# Patient Record
Sex: Male | Born: 1985 | Race: Black or African American | Hispanic: No | Marital: Single | Smoking: Current every day smoker
Health system: Southern US, Community
[De-identification: ages and names within clinical notes are randomized; demographics above are authoritative.]

## PROBLEM LIST (undated history)

## (undated) ENCOUNTER — Emergency Department (HOSPITAL_COMMUNITY): Admission: EM | Payer: Self-pay | Source: Home / Self Care

## (undated) DIAGNOSIS — M1990 Unspecified osteoarthritis, unspecified site: Secondary | ICD-10-CM

## (undated) DIAGNOSIS — J45909 Unspecified asthma, uncomplicated: Secondary | ICD-10-CM

## (undated) DIAGNOSIS — Z87442 Personal history of urinary calculi: Secondary | ICD-10-CM

## (undated) DIAGNOSIS — Z189 Retained foreign body fragments, unspecified material: Secondary | ICD-10-CM

## (undated) DIAGNOSIS — Z87828 Personal history of other (healed) physical injury and trauma: Secondary | ICD-10-CM

## (undated) DIAGNOSIS — E119 Type 2 diabetes mellitus without complications: Secondary | ICD-10-CM

## (undated) DIAGNOSIS — I10 Essential (primary) hypertension: Secondary | ICD-10-CM

## (undated) DIAGNOSIS — Z9889 Other specified postprocedural states: Secondary | ICD-10-CM

## (undated) DIAGNOSIS — W3400XA Accidental discharge from unspecified firearms or gun, initial encounter: Secondary | ICD-10-CM

## (undated) DIAGNOSIS — N1330 Unspecified hydronephrosis: Secondary | ICD-10-CM

## (undated) DIAGNOSIS — F1910 Other psychoactive substance abuse, uncomplicated: Secondary | ICD-10-CM

## (undated) HISTORY — PX: ABDOMINAL SURGERY: SHX537

---
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
## 2006-03-21 ENCOUNTER — Emergency Department (HOSPITAL_COMMUNITY): Admission: EM | Admit: 2006-03-21 | Discharge: 2006-03-21 | Payer: Self-pay | Admitting: Emergency Medicine

## 2009-01-24 ENCOUNTER — Emergency Department (HOSPITAL_COMMUNITY): Admission: EM | Admit: 2009-01-24 | Discharge: 2009-01-24 | Payer: Self-pay | Admitting: Emergency Medicine

## 2009-01-25 ENCOUNTER — Emergency Department (HOSPITAL_COMMUNITY): Admission: EM | Admit: 2009-01-25 | Discharge: 2009-01-26 | Payer: Self-pay | Admitting: Emergency Medicine

## 2010-07-29 LAB — COMPREHENSIVE METABOLIC PANEL
AST: 32 U/L (ref 0–37)
Albumin: 3.8 g/dL (ref 3.5–5.2)
BUN: 10 mg/dL (ref 6–23)
Calcium: 9.1 mg/dL (ref 8.4–10.5)
Chloride: 103 mEq/L (ref 96–112)
Creatinine, Ser: 0.94 mg/dL (ref 0.4–1.5)
GFR calc Af Amer: >60 mL/min (ref >60)
Total Bilirubin: 1 mg/dL (ref 0.3–1.2)

## 2010-07-29 LAB — POCT I-STAT, CHEM 8
BUN: 9 mg/dL (ref 6–23)
Calcium, Ion: 1.21 mmol/L (ref 1.12–1.32)
Chloride: 103 mEq/L (ref 96–112)
Creatinine, Ser: 0.9 mg/dL (ref 0.4–1.5)
Glucose, Bld: 102 mg/dL — ABNORMAL HIGH (ref 70–99)
TCO2: 27 mmol/L (ref 0–100)

## 2010-07-29 LAB — CBC
HCT: 45.1 % (ref 39.0–52.0)
MCHC: 32.9 g/dL (ref 30.0–36.0)
MCV: 83 fL (ref 78.0–100.0)
Platelets: 216 10*3/uL (ref 150–400)
RDW: 14.5 % (ref 11.5–15.5)
WBC: 7.6 10*3/uL (ref 4.0–10.5)

## 2010-07-29 LAB — LIPASE, BLOOD
Lipase: 15 U/L (ref 11–59)
Lipase: 17 U/L (ref 11–59)

## 2010-07-29 LAB — ETHANOL: Alcohol, Ethyl (B): <5 mg/dL (ref 0–10)

## 2010-07-29 LAB — DIFFERENTIAL
Basophils Absolute: 0 10*3/uL (ref 0.0–0.1)
Lymphocytes Relative: 5 % — ABNORMAL LOW (ref 12–46)
Lymphs Abs: 0.4 10*3/uL — ABNORMAL LOW (ref 0.7–4.0)
Monocytes Absolute: 0.2 10*3/uL (ref 0.1–1.0)
Neutro Abs: 7 10*3/uL (ref 1.7–7.7)

## 2010-09-10 NOTE — Op Note (Signed)
NAMEARTURO, Carlos Lawrence                     ACCOUNT NO.:  [id number]   MEDICAL RECORD NO.:  [id number]                   PATIENT TYPE:  AMB   LOCATION:  DSC                                  FACILITY:  [facility]   PHYSICIAN:  Lowell Bouton, M.D.      DATE OF BIRTH:  23-Dec-1985   DATE OF PROCEDURE:  05/29/2002  DATE OF DISCHARGE:                                 OPERATIVE REPORT   PREOPERATIVE DIAGNOSIS:  Lacerated radial digital artery and nerve, right  index finger.   POSTOPERATIVE DIAGNOSIS:  Lacerated radial digital artery and nerve, right  index finger.   OPERATION PERFORMED:  Repair of radial digital artery and nerve, right index  finger.   SURGEON:  Lowell Bouton, M.D.   ANESTHESIA:  General.   OPERATIVE FINDINGS:  The patient had a transverse laceration over the radial  aspect of the digit that extended through the radial neurovascular bundle at  the level of the proximal phalanx.  The flexor tendons appeared to be  intact.   DESCRIPTION OF PROCEDURE:  Under general anesthesia with a tourniquet on the  right arm, the right hand was prepped and draped in the usual fashion and  the sutures were removed from the previous skin closure.  The wound was  spread open and there was active bleeding.  The laceration was extended  proximally and distally in a zigzag fashion.  Blunt dissection was carried  through the subcutaneous tissues and 4-0 silk retention sutures were  inserted to retract the flaps.  Blunt dissection was carried down to the  flexor sheath and the sheath was not violated by the injury.  Both flexor  tendons were intact.  The microscope was then brought in and the  neurovascular bundle was identified by proximally and distally.  The artery  was repaired first after cutting back the proximal end to good intima.  The  vessel was dilated with Dora Sims solution and a dilator.  An approximating clamp  was applied proximally.  The distal portion of  the vessel was at a branching  and so was not trimmed back but the adventitia was pulled back.  The vessel  was dilated with Dora Sims solution and a dilator and a repair was performed  using a 10-0 nylon interrupted suture.  The vascular clamps were removed.  The digital nerve was then freed from the soft tissues and dissected out  proximally and distally.  The nerve ends were trimmed back with straight  scissors to good fascicles.  An epineurial repair was then performed using a  9-0 nylon suture in an interrupted fashion.  The tourniquet was then  released and there was good flow through the vessel.  The wound was  irrigated with saline.  The skin was closed with 4-0 nylon sutures.  Sterile  dressings were applied followed by a dorsal protective splint.  A Marcaine  digital block was performed for pain control.  The patient tolerated the  procedure well and  went to the recovery room awake and stable in  good  condition.                                                Lowell Bouton, M.D.    EMM/MEDQ  D:  05/29/2002  T:  05/29/2002  Job:  [id number]

## 2012-12-22 ENCOUNTER — Emergency Department (HOSPITAL_COMMUNITY)
Admission: EM | Admit: 2012-12-22 | Discharge: 2012-12-22 | Disposition: A | Discharge status: Home / Self Care | Payer: Self-pay | Attending: Emergency Medicine | Admitting: Emergency Medicine

## 2012-12-22 ENCOUNTER — Encounter (HOSPITAL_COMMUNITY): Payer: Self-pay

## 2012-12-22 DIAGNOSIS — R112 Nausea with vomiting, unspecified: Secondary | ICD-10-CM | POA: Insufficient documentation

## 2012-12-22 DIAGNOSIS — R109 Unspecified abdominal pain: Secondary | ICD-10-CM | POA: Insufficient documentation

## 2012-12-22 DIAGNOSIS — R42 Dizziness and giddiness: Secondary | ICD-10-CM | POA: Insufficient documentation

## 2012-12-22 HISTORY — DX: Unspecified asthma, uncomplicated: J45.909

## 2012-12-22 LAB — COMPREHENSIVE METABOLIC PANEL
ALT: 16 U/L (ref 0–53)
Alkaline Phosphatase: 54 U/L (ref 39–117)
BUN: 7 mg/dL (ref 6–23)
CO2: 29 mEq/L (ref 19–32)
GFR calc Af Amer: >90 mL/min (ref >90)
GFR calc non Af Amer: >90 mL/min (ref >90)
Glucose, Bld: 114 mg/dL — ABNORMAL HIGH (ref 70–99)
Potassium: 4.1 mEq/L (ref 3.5–5.1)
Sodium: 138 mEq/L (ref 135–145)
Total Bilirubin: 0.3 mg/dL (ref 0.3–1.2)

## 2012-12-22 LAB — LIPASE, BLOOD: Lipase: 68 U/L — ABNORMAL HIGH (ref 11–59)

## 2012-12-22 LAB — CBC
HCT: 41.2 % (ref 39.0–52.0)
Hemoglobin: 15 g/dL (ref 13.0–17.0)
RBC: 5.32 MIL/uL (ref 4.22–5.81)

## 2012-12-22 MED ORDER — ONDANSETRON HCL 4 MG/2ML IJ SOLN
4.0000 mg | Freq: Once | INTRAMUSCULAR | Status: AC
  Administered 2012-12-22: 4 mg via INTRAVENOUS
  Filled 2012-12-22: qty 2

## 2012-12-22 MED ORDER — GI COCKTAIL ~~LOC~~
30.0000 mL | Freq: Once | ORAL | Status: AC
  Administered 2012-12-22: 30 mL via ORAL
  Filled 2012-12-22: qty 30

## 2012-12-22 MED ORDER — PROMETHAZINE HCL 25 MG PO TABS: 25.0000 mg | ORAL_TABLET | Freq: Four times a day (QID) | ORAL | Status: DC | PRN

## 2012-12-22 MED ORDER — SODIUM CHLORIDE 0.9 % IV BOLUS (SEPSIS)
1000.0000 mL | Freq: Once | INTRAVENOUS | Status: AC
  Administered 2012-12-22: 1000 mL via INTRAVENOUS

## 2012-12-22 MED ORDER — PROMETHAZINE HCL 25 MG PO TABS
25.0000 mg | ORAL_TABLET | Freq: Once | ORAL | Status: AC
  Administered 2012-12-22: 25 mg via ORAL
  Filled 2012-12-22: qty 1

## 2012-12-22 NOTE — ED Notes (Signed)
Patient having emesis

## 2012-12-22 NOTE — ED Notes (Signed)
Patient tolerating fluids at this time without difficulty.

## 2012-12-22 NOTE — ED Notes (Signed)
Patient c/o abdominal pain which came on all of a sudden. Acknowledges having N/V. Vomited 6X since. Denies diarrhea. Patient took tylenol without relief.

## 2012-12-22 NOTE — ED Provider Notes (Signed)
CSN: [id number]     Arrival date & time 12/22/12  0725 History   First MD Initiated Contact with Patient 12/22/12 [phone]     Chief Complaint  Patient presents with  . Abdominal Pain   (Consider location/radiation/quality/duration/timing/severity/associated sxs/prior Treatment) Patient is a 27 y.o. male presenting with abdominal pain. The history is provided by the patient.  Abdominal Pain Pain location:  Epigastric Pain quality: sharp   Pain radiates to:  Does not radiate Pain severity:  Severe Onset quality:  Sudden Duration:  12 hours Timing:  Constant Chronicity:  New Context: not alcohol use, not previous surgeries, not recent illness, not sick contacts and not suspicious food intake   Relieved by:  Nothing Associated symptoms: nausea and vomiting   Associated symptoms: no constipation, no diarrhea, no dysuria, no fever and no hematuria     No past medical history on file. No past surgical history on file. No family history on file. History  Substance Use Topics  . Smoking status: Not on file  . Smokeless tobacco: Not on file  . Alcohol Use: Not on file    Review of Systems  Constitutional: Negative for fever.  Gastrointestinal: Positive for nausea, vomiting and abdominal pain. Negative for diarrhea, constipation, blood in stool and abdominal distention.  Genitourinary: Negative for dysuria, hematuria, penile pain and testicular pain.  Musculoskeletal: Negative for back pain.  Neurological: Positive for light-headedness (feels lightheaded after vomiting).  All other systems reviewed and are negative.    Allergies  Review of patient's allergies indicates not on file.  Home Medications  No current outpatient prescriptions on file. There were no vitals taken for this visit. Physical Exam  Nursing note and vitals reviewed. Constitutional: He is oriented to person, place, and time. He appears well-developed and well-nourished. No distress.  HENT:  Head:  Normocephalic and atraumatic.  Right Ear: External ear normal.  Left Ear: External ear normal.  Nose: Nose normal.  Eyes: Right eye exhibits no discharge. Left eye exhibits no discharge.  Neck: Neck supple.  Cardiovascular: Normal rate, regular rhythm, normal heart sounds and intact distal pulses.   Pulmonary/Chest: Effort normal and breath sounds normal.  Abdominal: Soft. Normal appearance. There is tenderness (mild) in the epigastric area and left upper quadrant.  Musculoskeletal: He exhibits no edema.  Neurological: He is alert and oriented to person, place, and time.  Skin: Skin is warm and dry.    ED Course  Procedures (including critical care time) Labs Review Labs Reviewed  CBC - Abnormal; Notable for the following:    WBC 11.0 (*)    MCV 77.4 (*)    MCHC 36.4 (*)    All other components within normal limits  COMPREHENSIVE METABOLIC PANEL - Abnormal; Notable for the following:    Glucose, Bld 114 (*)    AST 40 (*)    All other components within normal limits  LIPASE, BLOOD - Abnormal; Notable for the following:    Lipase 68 (*)    All other components within normal limits   Imaging Review No results found.  MDM   1. Abdominal pain   2. Nausea & vomiting     27 year old male with vomiting and epigastric pain since last night. Denies any preceding symptoms. His abdominal exam is benign including no pain in the lower quadrants. No urinary symptoms. He appears well, will give fluids, Zofran, and a GI cocktail. Low concern for a surgical process in his abdomen. Sx improved with anti-emetics, and he was able to  take PO in ED. Most c/w a viral process, will treat symptomatically, discussed using anti-emetics at home, as well as liquid diet.    Audree Camel, MD 12/22/12 [phone]

## 2013-09-13 ENCOUNTER — Encounter (HOSPITAL_COMMUNITY): Payer: Self-pay | Admitting: Certified Registered Nurse Anesthetist

## 2013-09-13 ENCOUNTER — Emergency Department (HOSPITAL_COMMUNITY): Payer: Self-pay

## 2013-09-13 ENCOUNTER — Encounter (HOSPITAL_COMMUNITY): Payer: Self-pay | Admitting: Emergency Medicine

## 2013-09-13 ENCOUNTER — Encounter (HOSPITAL_COMMUNITY): Admission: EM | Disposition: A | Discharge status: Home / Self Care | Payer: Self-pay | Source: Home / Self Care

## 2013-09-13 ENCOUNTER — Inpatient Hospital Stay (HOSPITAL_COMMUNITY)
Admission: EM | Admit: 2013-09-13 | Discharge: 2013-09-18 | DRG: 580 | Disposition: A | Discharge status: Home / Self Care | Payer: Self-pay | Attending: General Surgery | Admitting: General Surgery

## 2013-09-13 ENCOUNTER — Inpatient Hospital Stay (HOSPITAL_COMMUNITY): Payer: Self-pay

## 2013-09-13 ENCOUNTER — Emergency Department (HOSPITAL_COMMUNITY): Payer: Self-pay | Admitting: Certified Registered Nurse Anesthetist

## 2013-09-13 DIAGNOSIS — I10 Essential (primary) hypertension: Secondary | ICD-10-CM

## 2013-09-13 DIAGNOSIS — R102 Pelvic and perineal pain: Secondary | ICD-10-CM

## 2013-09-13 DIAGNOSIS — R1012 Left upper quadrant pain: Secondary | ICD-10-CM

## 2013-09-13 DIAGNOSIS — S31109A Unspecified open wound of abdominal wall, unspecified quadrant without penetration into peritoneal cavity, initial encounter: Principal | ICD-10-CM | POA: Diagnosis present

## 2013-09-13 DIAGNOSIS — S3120XA Unspecified open wound of penis, initial encounter: Secondary | ICD-10-CM

## 2013-09-13 DIAGNOSIS — I82409 Acute embolism and thrombosis of unspecified deep veins of unspecified lower extremity: Secondary | ICD-10-CM | POA: Diagnosis present

## 2013-09-13 DIAGNOSIS — S32309A Unspecified fracture of unspecified ilium, initial encounter for closed fracture: Secondary | ICD-10-CM | POA: Diagnosis present

## 2013-09-13 DIAGNOSIS — S52502A Unspecified fracture of the lower end of left radius, initial encounter for closed fracture: Secondary | ICD-10-CM | POA: Diagnosis present

## 2013-09-13 DIAGNOSIS — I82509 Chronic embolism and thrombosis of unspecified deep veins of unspecified lower extremity: Secondary | ICD-10-CM | POA: Diagnosis present

## 2013-09-13 DIAGNOSIS — S3994XA Unspecified injury of external genitals, initial encounter: Secondary | ICD-10-CM | POA: Diagnosis present

## 2013-09-13 DIAGNOSIS — S3681XA Injury of peritoneum, initial encounter: Secondary | ICD-10-CM

## 2013-09-13 DIAGNOSIS — S39848A Other specified injuries of external genitals, initial encounter: Secondary | ICD-10-CM | POA: Diagnosis present

## 2013-09-13 DIAGNOSIS — W3400XA Accidental discharge from unspecified firearms or gun, initial encounter: Secondary | ICD-10-CM

## 2013-09-13 DIAGNOSIS — D72829 Elevated white blood cell count, unspecified: Secondary | ICD-10-CM | POA: Diagnosis present

## 2013-09-13 DIAGNOSIS — S52599A Other fractures of lower end of unspecified radius, initial encounter for closed fracture: Secondary | ICD-10-CM | POA: Diagnosis present

## 2013-09-13 DIAGNOSIS — R109 Unspecified abdominal pain: Secondary | ICD-10-CM

## 2013-09-13 DIAGNOSIS — S31139A Puncture wound of abdominal wall without foreign body, unspecified quadrant without penetration into peritoneal cavity, initial encounter: Secondary | ICD-10-CM

## 2013-09-13 DIAGNOSIS — D62 Acute posthemorrhagic anemia: Secondary | ICD-10-CM | POA: Diagnosis not present

## 2013-09-13 HISTORY — DX: Essential (primary) hypertension: I10

## 2013-09-13 HISTORY — PX: CYSTOSCOPY: SHX5120

## 2013-09-13 HISTORY — PX: LAPAROTOMY: SHX154

## 2013-09-13 LAB — I-STAT CHEM 8, ED
BUN: 12 mg/dL (ref 6–23)
CALCIUM ION: 1.21 mmol/L (ref 1.12–1.23)
Chloride: 98 mEq/L (ref 96–112)
Creatinine, Ser: 1.4 mg/dL — ABNORMAL HIGH (ref 0.50–1.35)
Glucose, Bld: 123 mg/dL — ABNORMAL HIGH (ref 70–99)
HEMATOCRIT: 49 % (ref 39.0–52.0)
Hemoglobin: 16.7 g/dL (ref 13.0–17.0)
POTASSIUM: 3.7 meq/L (ref 3.7–5.3)
Sodium: 141 mEq/L (ref 137–147)
TCO2: 24 mmol/L (ref 0–100)

## 2013-09-13 LAB — BASIC METABOLIC PANEL
BUN: 12 mg/dL (ref 6–23)
CO2: 24 mEq/L (ref 19–32)
Calcium: 8.4 mg/dL (ref 8.4–10.5)
Chloride: 101 mEq/L (ref 96–112)
Creatinine, Ser: 0.89 mg/dL (ref 0.50–1.35)
GFR calc Af Amer: >90 mL/min (ref >90)
GFR calc non Af Amer: >90 mL/min (ref >90)
GLUCOSE: 183 mg/dL — AB (ref 70–99)
POTASSIUM: 4.2 meq/L (ref 3.7–5.3)
Sodium: 136 mEq/L — ABNORMAL LOW (ref 137–147)

## 2013-09-13 LAB — PROTIME-INR
INR: 0.96 (ref 0.00–1.49)
Prothrombin Time: 12.6 seconds (ref 11.6–15.2)

## 2013-09-13 LAB — CBC
HEMATOCRIT: 41.6 % (ref 39.0–52.0)
HEMATOCRIT: 38.5 % — AB (ref 39.0–52.0)
HEMOGLOBIN: 14 g/dL (ref 13.0–17.0)
Hemoglobin: 15.1 g/dL (ref 13.0–17.0)
MCH: 28.5 pg (ref 26.0–34.0)
MCH: 28.5 pg (ref 26.0–34.0)
MCHC: 36.3 g/dL — ABNORMAL HIGH (ref 30.0–36.0)
MCHC: 36.4 g/dL — ABNORMAL HIGH (ref 30.0–36.0)
MCV: 78.6 fL (ref 78.0–100.0)
MCV: 78.4 fL (ref 78.0–100.0)
Platelets: 274 10*3/uL (ref 150–400)
Platelets: 251 10*3/uL (ref 150–400)
RBC: 5.29 MIL/uL (ref 4.22–5.81)
RBC: 4.91 MIL/uL (ref 4.22–5.81)
RDW: 13.2 % (ref 11.5–15.5)
RDW: 13.2 % (ref 11.5–15.5)
WBC: 7.7 10*3/uL (ref 4.0–10.5)
WBC: 21.9 10*3/uL — ABNORMAL HIGH (ref 4.0–10.5)

## 2013-09-13 LAB — COMPREHENSIVE METABOLIC PANEL
ALBUMIN: 4 g/dL (ref 3.5–5.2)
ALT: 11 U/L (ref 0–53)
AST: 26 U/L (ref 0–37)
Alkaline Phosphatase: 47 U/L (ref 39–117)
BUN: 12 mg/dL (ref 6–23)
CHLORIDE: 101 meq/L (ref 96–112)
CO2: 24 mEq/L (ref 19–32)
Calcium: 9.2 mg/dL (ref 8.4–10.5)
Creatinine, Ser: 1.26 mg/dL (ref 0.50–1.35)
GFR calc Af Amer: 89 mL/min — ABNORMAL LOW (ref >90)
GFR calc non Af Amer: 77 mL/min — ABNORMAL LOW (ref >90)
Glucose, Bld: 127 mg/dL — ABNORMAL HIGH (ref 70–99)
POTASSIUM: 4 meq/L (ref 3.7–5.3)
SODIUM: 140 meq/L (ref 137–147)
TOTAL PROTEIN: 7.6 g/dL (ref 6.0–8.3)
Total Bilirubin: 0.7 mg/dL (ref 0.3–1.2)

## 2013-09-13 LAB — PREPARE FRESH FROZEN PLASMA
UNIT DIVISION: 0
Unit division: 0

## 2013-09-13 LAB — ETHANOL: Alcohol, Ethyl (B): <11 mg/dL (ref 0–11)

## 2013-09-13 LAB — BLOOD PRODUCT ORDER (VERBAL) VERIFICATION

## 2013-09-13 LAB — ABO/RH: ABO/RH(D): B POS

## 2013-09-13 LAB — I-STAT CG4 LACTIC ACID, ED: Lactic Acid, Venous: 3.45 mmol/L — ABNORMAL HIGH (ref 0.5–2.2)

## 2013-09-13 LAB — CDS SEROLOGY

## 2013-09-13 LAB — PREPARE RBC (CROSSMATCH)

## 2013-09-13 SURGERY — LAPAROTOMY, EXPLORATORY
Anesthesia: General | Site: Penis

## 2013-09-13 MED ORDER — ARTIFICIAL TEARS OP OINT
TOPICAL_OINTMENT | OPHTHALMIC | Status: AC
  Filled 2013-09-13: qty 3.5

## 2013-09-13 MED ORDER — POVIDONE-IODINE 10 % OINT PACKET
TOPICAL_OINTMENT | CUTANEOUS | Status: DC | PRN
  Administered 2013-09-13: 1 via TOPICAL

## 2013-09-13 MED ORDER — FENTANYL CITRATE 0.05 MG/ML IJ SOLN
INTRAMUSCULAR | Status: AC
  Filled 2013-09-13: qty 5

## 2013-09-13 MED ORDER — SODIUM CHLORIDE 0.9 % IV SOLN
3.0000 g | Freq: Once | INTRAVENOUS | Status: DC
  Filled 2013-09-13: qty 3

## 2013-09-13 MED ORDER — HYDROMORPHONE HCL PF 1 MG/ML IJ SOLN
0.2500 mg | INTRAMUSCULAR | Status: DC | PRN
  Administered 2013-09-13 (×2): 0.5 mg via INTRAVENOUS

## 2013-09-13 MED ORDER — PROPOFOL 10 MG/ML IV BOLUS
INTRAVENOUS | Status: DC | PRN
  Administered 2013-09-13: 100 mg via INTRAVENOUS

## 2013-09-13 MED ORDER — DIPHENHYDRAMINE HCL 12.5 MG/5ML PO ELIX
12.5000 mg | ORAL_SOLUTION | Freq: Four times a day (QID) | ORAL | Status: DC | PRN
  Filled 2013-09-13: qty 5

## 2013-09-13 MED ORDER — ONDANSETRON HCL 4 MG/2ML IJ SOLN
4.0000 mg | Freq: Four times a day (QID) | INTRAMUSCULAR | Status: DC | PRN
  Filled 2013-09-13: qty 2

## 2013-09-13 MED ORDER — GLYCOPYRROLATE 0.2 MG/ML IJ SOLN
INTRAMUSCULAR | Status: DC | PRN
  Administered 2013-09-13: .2 mg via INTRAVENOUS
  Administered 2013-09-13: .7 mg via INTRAVENOUS

## 2013-09-13 MED ORDER — GLYCOPYRROLATE 0.2 MG/ML IJ SOLN
INTRAMUSCULAR | Status: AC
  Filled 2013-09-13: qty 1

## 2013-09-13 MED ORDER — SUCCINYLCHOLINE CHLORIDE 20 MG/ML IJ SOLN
INTRAMUSCULAR | Status: DC | PRN
  Administered 2013-09-13: 80 mg via INTRAVENOUS

## 2013-09-13 MED ORDER — PROPOFOL 10 MG/ML IV BOLUS
INTRAVENOUS | Status: AC
  Filled 2013-09-13: qty 20

## 2013-09-13 MED ORDER — NALOXONE HCL 0.4 MG/ML IJ SOLN
0.4000 mg | INTRAMUSCULAR | Status: DC | PRN
Start: 2013-09-13 — End: 2013-09-15
  Filled 2013-09-13: qty 1

## 2013-09-13 MED ORDER — SODIUM CHLORIDE 0.9 % IJ SOLN: 9.0000 mL | INTRAMUSCULAR | Status: DC | PRN

## 2013-09-13 MED ORDER — HYDRALAZINE HCL 20 MG/ML IJ SOLN
INTRAMUSCULAR | Status: DC | PRN
  Administered 2013-09-13 (×2): 5 mg via INTRAVENOUS

## 2013-09-13 MED ORDER — DIPHENHYDRAMINE HCL 50 MG/ML IJ SOLN
12.5000 mg | Freq: Four times a day (QID) | INTRAMUSCULAR | Status: DC | PRN
  Filled 2013-09-13: qty 0.25

## 2013-09-13 MED ORDER — ACETAMINOPHEN 325 MG PO TABS: 325.0000 mg | ORAL_TABLET | ORAL | Status: DC | PRN

## 2013-09-13 MED ORDER — AMPICILLIN-SULBACTAM SODIUM 3 (2-1) G IJ SOLR
3.0000 g | INTRAMUSCULAR | Status: DC | PRN
  Administered 2013-09-13: 3 g via INTRAVENOUS

## 2013-09-13 MED ORDER — ROCURONIUM BROMIDE 50 MG/5ML IV SOLN
INTRAVENOUS | Status: AC
  Filled 2013-09-13: qty 1

## 2013-09-13 MED ORDER — HYDRALAZINE HCL 20 MG/ML IJ SOLN
INTRAMUSCULAR | Status: AC
  Filled 2013-09-13: qty 1

## 2013-09-13 MED ORDER — OXYCODONE HCL 5 MG PO TABS: 5.0000 mg | ORAL_TABLET | Freq: Once | ORAL | Status: DC | PRN

## 2013-09-13 MED ORDER — NEOSTIGMINE METHYLSULFATE 10 MG/10ML IV SOLN
INTRAVENOUS | Status: AC
  Filled 2013-09-13: qty 2

## 2013-09-13 MED ORDER — HYDROCHLOROTHIAZIDE 25 MG PO TABS
25.0000 mg | ORAL_TABLET | Freq: Every day | ORAL | Status: DC
  Administered 2013-09-14 – 2013-09-18 (×5): 25 mg via ORAL
  Filled 2013-09-13 (×6): qty 1

## 2013-09-13 MED ORDER — PANTOPRAZOLE SODIUM 40 MG PO TBEC
40.0000 mg | DELAYED_RELEASE_TABLET | Freq: Every day | ORAL | Status: DC
  Administered 2013-09-15 – 2013-09-16 (×2): 40 mg via ORAL
  Filled 2013-09-13 (×2): qty 1

## 2013-09-13 MED ORDER — HYDROMORPHONE 0.3 MG/ML IV SOLN
INTRAVENOUS | Status: DC
  Administered 2013-09-13: 0.3 mg via INTRAVENOUS
  Administered 2013-09-13: 07:00:00 via INTRAVENOUS
  Administered 2013-09-13: 0.9 mg via INTRAVENOUS
  Administered 2013-09-14: 0.6 mg via INTRAVENOUS
  Administered 2013-09-14 (×2): 0.9 mg via INTRAVENOUS
  Administered 2013-09-14: 0.6 mg via INTRAVENOUS
  Administered 2013-09-15: 02:00:00 via INTRAVENOUS
  Administered 2013-09-15: 0.6 mg via INTRAVENOUS
  Administered 2013-09-15: 0.3 mg via INTRAVENOUS
  Filled 2013-09-13: qty 25

## 2013-09-13 MED ORDER — KCL IN DEXTROSE-NACL 20-5-0.45 MEQ/L-%-% IV SOLN
INTRAVENOUS | Status: DC
  Administered 2013-09-13: 100 mL/h via INTRAVENOUS
  Administered 2013-09-13: 08:00:00 via INTRAVENOUS
  Administered 2013-09-14: 100 mL/h via INTRAVENOUS
  Filled 2013-09-13 (×13): qty 1000

## 2013-09-13 MED ORDER — PROMETHAZINE HCL 25 MG/ML IJ SOLN: 6.2500 mg | INTRAMUSCULAR | Status: DC | PRN

## 2013-09-13 MED ORDER — LACTATED RINGERS IV SOLN
INTRAVENOUS | Status: DC | PRN
  Administered 2013-09-13 (×3): via INTRAVENOUS

## 2013-09-13 MED ORDER — MIDAZOLAM HCL 5 MG/5ML IJ SOLN
INTRAMUSCULAR | Status: DC | PRN
  Administered 2013-09-13: 2 mg via INTRAVENOUS

## 2013-09-13 MED ORDER — ARTIFICIAL TEARS OP OINT
TOPICAL_OINTMENT | OPHTHALMIC | Status: DC | PRN
  Administered 2013-09-13: 1 via OPHTHALMIC

## 2013-09-13 MED ORDER — ONDANSETRON HCL 4 MG/2ML IJ SOLN
INTRAMUSCULAR | Status: AC
  Filled 2013-09-13: qty 2

## 2013-09-13 MED ORDER — NEOSTIGMINE METHYLSULFATE 10 MG/10ML IV SOLN
INTRAVENOUS | Status: DC | PRN
  Administered 2013-09-13: 5 mg via INTRAVENOUS

## 2013-09-13 MED ORDER — HYDROMORPHONE HCL PF 1 MG/ML IJ SOLN
INTRAMUSCULAR | Status: AC
  Filled 2013-09-13: qty 1

## 2013-09-13 MED ORDER — FENTANYL CITRATE 0.05 MG/ML IJ SOLN
INTRAMUSCULAR | Status: DC | PRN
  Administered 2013-09-13 (×2): 100 ug via INTRAVENOUS
  Administered 2013-09-13 (×2): 50 ug via INTRAVENOUS
  Administered 2013-09-13: 200 ug via INTRAVENOUS

## 2013-09-13 MED ORDER — OXYCODONE HCL 5 MG/5ML PO SOLN: 5.0000 mg | Freq: Once | ORAL | Status: DC | PRN

## 2013-09-13 MED ORDER — ONDANSETRON HCL 4 MG/2ML IJ SOLN
INTRAMUSCULAR | Status: DC | PRN
  Administered 2013-09-13: 4 mg via INTRAVENOUS

## 2013-09-13 MED ORDER — ENOXAPARIN SODIUM 40 MG/0.4ML ~~LOC~~ SOLN
40.0000 mg | SUBCUTANEOUS | Status: DC
  Administered 2013-09-14 – 2013-09-17 (×4): 40 mg via SUBCUTANEOUS
  Filled 2013-09-13 (×5): qty 0.4

## 2013-09-13 MED ORDER — PANTOPRAZOLE SODIUM 40 MG IV SOLR
40.0000 mg | Freq: Every day | INTRAVENOUS | Status: DC
  Administered 2013-09-14: 40 mg via INTRAVENOUS
  Filled 2013-09-13 (×5): qty 40

## 2013-09-13 MED ORDER — ACETAMINOPHEN 160 MG/5ML PO SOLN
325.0000 mg | ORAL | Status: DC | PRN
  Filled 2013-09-13: qty 20.3

## 2013-09-13 MED ORDER — MIDAZOLAM HCL 2 MG/2ML IJ SOLN
INTRAMUSCULAR | Status: AC
  Filled 2013-09-13: qty 2

## 2013-09-13 MED ORDER — ROCURONIUM BROMIDE 100 MG/10ML IV SOLN
INTRAVENOUS | Status: DC | PRN
  Administered 2013-09-13: 10 mg via INTRAVENOUS
  Administered 2013-09-13: 20 mg via INTRAVENOUS
  Administered 2013-09-13: 10 mg via INTRAVENOUS
  Administered 2013-09-13: 30 mg via INTRAVENOUS

## 2013-09-13 MED ORDER — 0.9 % SODIUM CHLORIDE (POUR BTL) OPTIME
TOPICAL | Status: DC | PRN
  Administered 2013-09-13: 3000 mL

## 2013-09-13 MED ORDER — BACITRACIN ZINC 500 UNIT/GM EX OINT
TOPICAL_OINTMENT | CUTANEOUS | Status: DC | PRN
  Administered 2013-09-13: 1 via TOPICAL

## 2013-09-13 MED ORDER — KCL IN DEXTROSE-NACL 20-5-0.45 MEQ/L-%-% IV SOLN
INTRAVENOUS | Status: AC
  Filled 2013-09-13: qty 1000

## 2013-09-13 MED ORDER — IOHEXOL 350 MG/ML SOLN
100.0000 mL | Freq: Once | INTRAVENOUS | Status: AC | PRN
  Administered 2013-09-13: 100 mL via INTRAVENOUS

## 2013-09-13 MED ORDER — HYDROMORPHONE 0.3 MG/ML IV SOLN
INTRAVENOUS | Status: AC
  Filled 2013-09-13: qty 25

## 2013-09-13 SURGICAL SUPPLY — 58 items
BLADE SURG ROTATE 9660 (MISCELLANEOUS) ×4 IMPLANT
BNDG CONFORM 3 STRL LF (GAUZE/BANDAGES/DRESSINGS) ×2 IMPLANT
BRR ADH 5X3 SEPRAFILM 6 SHT (MISCELLANEOUS)
CANISTER SUCTION 2500CC (MISCELLANEOUS) ×4 IMPLANT
CHLORAPREP W/TINT 26ML (MISCELLANEOUS) ×2 IMPLANT
COVER MAYO STAND STRL (DRAPES) IMPLANT
COVER SURGICAL LIGHT HANDLE (MISCELLANEOUS) ×4 IMPLANT
DRAIN PENROSE 1/2X12 LTX STRL (WOUND CARE) ×2 IMPLANT
DRAPE LAPAROSCOPIC ABDOMINAL (DRAPES) ×4 IMPLANT
DRAPE PROXIMA HALF (DRAPES) IMPLANT
DRAPE UTILITY 15X26 W/TAPE STR (DRAPE) ×8 IMPLANT
DRAPE WARM FLUID 44X44 (DRAPE) ×4 IMPLANT
DRSG OPSITE POSTOP 4X10 (GAUZE/BANDAGES/DRESSINGS) ×2 IMPLANT
DRSG OPSITE POSTOP 4X8 (GAUZE/BANDAGES/DRESSINGS) ×2 IMPLANT
ELECT BLADE 6.5 EXT (BLADE) IMPLANT
ELECT CAUTERY BLADE 6.4 (BLADE) ×6 IMPLANT
ELECT REM PT RETURN 9FT ADLT (ELECTROSURGICAL) ×4
ELECTRODE REM PT RTRN 9FT ADLT (ELECTROSURGICAL) ×2 IMPLANT
GAUZE SPONGE 4X4 12PLY STRL (GAUZE/BANDAGES/DRESSINGS) ×1 IMPLANT
GLOVE BIO SURGEON STRL SZ7 (GLOVE) ×2 IMPLANT
GLOVE BIO SURGEON STRL SZ8 (GLOVE) ×2 IMPLANT
GLOVE BIOGEL PI IND STRL 8 (GLOVE) ×2 IMPLANT
GLOVE BIOGEL PI INDICATOR 8 (GLOVE) ×2
GLOVE ECLIPSE 7.5 STRL STRAW (GLOVE) ×4 IMPLANT
GLOVE SURG SIGNA 7.5 PF LTX (GLOVE) ×2 IMPLANT
GOWN STRL REUS W/ TWL LRG LVL3 (GOWN DISPOSABLE) ×6 IMPLANT
GOWN STRL REUS W/TWL LRG LVL3 (GOWN DISPOSABLE) ×12
KIT BASIN OR (CUSTOM PROCEDURE TRAY) ×4 IMPLANT
KIT ROOM TURNOVER OR (KITS) ×4 IMPLANT
LIGASURE IMPACT 36 18CM CVD LR (INSTRUMENTS) IMPLANT
NS IRRIG 1000ML POUR BTL (IV SOLUTION) ×8 IMPLANT
PACK GENERAL/GYN (CUSTOM PROCEDURE TRAY) ×4 IMPLANT
PAD ARMBOARD 7.5X6 YLW CONV (MISCELLANEOUS) ×4 IMPLANT
PENCIL BUTTON HOLSTER BLD 10FT (ELECTRODE) IMPLANT
PLUG CATH AND CAP STER (CATHETERS) ×2 IMPLANT
SEPRAFILM PROCEDURAL PACK 3X5 (MISCELLANEOUS) IMPLANT
SET CYSTO W/LG BORE CLAMP LF (SET/KITS/TRAYS/PACK) ×2 IMPLANT
SPECIMEN JAR LARGE (MISCELLANEOUS) IMPLANT
SPONGE GAUZE 4X4 12PLY (GAUZE/BANDAGES/DRESSINGS) ×2 IMPLANT
SPONGE LAP 18X18 X RAY DECT (DISPOSABLE) IMPLANT
STAPLER VISISTAT 35W (STAPLE) ×4 IMPLANT
SUCTION POOLE TIP (SUCTIONS) ×4 IMPLANT
SUT CHROMIC 3 0 SH 27 (SUTURE) ×4 IMPLANT
SUT NOVA 1 T20/GS 25DT (SUTURE) IMPLANT
SUT PDS AB 1 TP1 96 (SUTURE) ×8 IMPLANT
SUT SILK 2 0 SH CR/8 (SUTURE) ×4 IMPLANT
SUT SILK 2 0 TIES 10X30 (SUTURE) ×4 IMPLANT
SUT SILK 3 0 SH CR/8 (SUTURE) ×4 IMPLANT
SUT SILK 3 0 TIES 10X30 (SUTURE) ×4 IMPLANT
SUT VIC AB 4-0 RB1 27 (SUTURE) ×4
SUT VIC AB 4-0 RB1 27X BRD (SUTURE) IMPLANT
SUT VIC AB 4-0 SH 27 (SUTURE) ×4
SUT VIC AB 4-0 SH 27XBRD (SUTURE) IMPLANT
TOWEL OR 17X26 10 PK STRL BLUE (TOWEL DISPOSABLE) ×6 IMPLANT
TRAY FOLEY CATH 16FRSI W/METER (SET/KITS/TRAYS/PACK) ×2 IMPLANT
TUBE CONNECTING 12'X1/4 (SUCTIONS)
TUBE CONNECTING 12X1/4 (SUCTIONS) IMPLANT
YANKAUER SUCT BULB TIP NO VENT (SUCTIONS) IMPLANT
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
## 2013-09-13 NOTE — Discharge Instructions (Addendum)
Postoperative instructions for penile surgery  Wound:  In most cases your incision will have absorbable sutures that run along the course of your incision and will dissolve within the first 10-20 days. Some will fall out even earlier. Expect some redness as the sutures dissolved but this should occur only around the sutures. If there is generalized redness, especially with increasing pain or swelling, let us know. The penis will very likely get "black and blue" as the blood in the tissues spread. Sometimes the whole penis will turn colors. The black and blue is followed by a yellow and brown color. In time, all the discoloration will go away.  Diet:  You may return to your normal diet within 24 hours following your surgery. You may note some mild nausea and possibly vomiting the first 6-8 hours following surgery. This is usually due to the side effects of anesthesia, and will disappear quite soon. I would suggest clear liquids and a very light meal the first evening following your surgery.  Activity:  Your physical activity should be restricted the first 48 hours. During that time you should remain relatively inactive, moving about only when necessary. During the first 7-10 days following surgery he should avoid lifting any heavy objects (anything greater than 15 pounds), and avoid strenuous exercise. If you work, ask [place] specifically about your restrictions, both for work and home. We will write a note to your employer if needed.  Ice packs can be placed on and off over the penis for the first 48 hours to help relieve the pain and keep the swelling down. Frozen peas or corn in a ZipLock bag can be frozen, used and re-frozen. Fifteen minutes on and 15 minutes off is a reasonable schedule.   Hygiene:  You may shower 48 hours after your surgery. Tub bathing should be restricted until the seventh day.  Medication:  You will be sent home with some type of pain medication. In many cases you will be  sent home with a narcotic pain pill (Vicodin or Tylox). If the pain is not too bad, you may take either Tylenol (acetaminophen) or Advil (ibuprofen) which contain no narcotic agents, and might be tolerated a little better, with fewer side effects. If the pain medication you are sent home with does not control the pain, you will have to let us know. Some narcotic pain medications cannot be given or refilled by a phone call to a pharmacy.  Problems you should report to [place]:   Fever of 101.0 degrees Fahrenheit or greater.  Moderate or severe swelling under the skin incision or involving the scrotum.  Drug reaction such as hives, a rash, nausea or vomiting.     Orthopaedic Discharge Instructions  Touchdown weightbearing Left Leg Follow up in 3 weeks Call [phone] with questions   Keep arm splint dry and intact. Do not bear weight on left wrist.  No lifting more than 5 pounds for 6 weeks. Wash wounds daily in shower with soap and water. Do not soak. Apply antibiotic ointment (e.g. Neosporin) twice daily and as needed to keep moist. Cover with dry dressing.  No driving while taking oxycodone.

## 2013-09-13 NOTE — Progress Notes (Signed)
UR completed.  Timathy Newberry, RN BSN [facility] CCM Trauma/Neuro ICU Case Manager [phone]  

## 2013-09-13 NOTE — Anesthesia Procedure Notes (Signed)
Procedure Name: Intubation Date/Time: 09/13/2013 5:06 AM Performed by: Hollie Salk Z Pre-anesthesia Checklist: Patient identified, Timeout performed, Emergency Drugs available, Patient being monitored and Suction available Patient Re-evaluated:Patient Re-evaluated prior to inductionOxygen Delivery Method: Circle system utilized Preoxygenation: Pre-oxygenation with 100% oxygen Intubation Type: Rapid sequence, IV induction and Cricoid Pressure applied Laryngoscope Size: Mac and 4 Grade View: Grade I Tube type: Oral Tube size: 7.5 mm Number of attempts: 1 Airway Equipment and Method: Stylet Placement Confirmation: ETT inserted through vocal cords under direct vision,  breath sounds checked- equal and bilateral and positive ETCO2 Secured at: 23 cm Tube secured with: Tape Dental Injury: Teeth and Oropharynx as per pre-operative assessment

## 2013-09-13 NOTE — ED Notes (Signed)
No FAST done per Dr. Hulen Skains request.
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
## 2013-09-13 NOTE — ED Notes (Signed)
I-Stat Lactic Acid results shown to EDP.
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
## 2013-09-13 NOTE — Progress Notes (Signed)
D; report given to Rebeccs S.RN , pt is going to CT then to 5N30 via bed.
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
## 2013-09-13 NOTE — Transfer of Care (Signed)
Immediate Anesthesia Transfer of Care Note  Patient: Carlos Lawrence  Procedure(s) Performed: Procedure(s): EXPLORATORY LAPAROTOMY (N/A) CYSTOSCOPY and laceration repair (N/A)  Patient Location: PACU  Anesthesia Type:General  Level of Consciousness: awake and patient cooperative  Airway & Oxygen Therapy: Patient Spontanous Breathing and Patient connected to face mask oxygen  Post-op Assessment: Report given to PACU RN and Post -op Vital signs reviewed and stable  Post vital signs: Reviewed and stable  Complications: No apparent anesthesia complications
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
## 2013-09-13 NOTE — ED Notes (Signed)
Could not get a second line. Unsuccessful x 2. Dr. Hulen Skains said, "we got to get pt. To OR and they will try it in OR." A good PIV in left a/c.

## 2013-09-13 NOTE — Anesthesia Postprocedure Evaluation (Signed)
  Anesthesia Post-op Note  Patient: Carlos Lawrence  Procedure(s) Performed: Procedure(s): EXPLORATORY LAPAROTOMY (N/A) CYSTOSCOPY and laceration repair (N/A)  Patient Location: PACU  Anesthesia Type:General  Level of Consciousness: awake and alert   Airway and Oxygen Therapy: Patient Spontanous Breathing and Patient connected to nasal cannula oxygen  Post-op Pain: moderate  Post-op Assessment: Post-op Vital signs reviewed, Patient's Cardiovascular Status Stable, Respiratory Function Stable, Patent Airway, No signs of Nausea or vomiting and Pain level controlled  Post-op Vital Signs: Reviewed and stable  Last Vitals:  Filed Vitals:   09/13/13 0730  BP: 151/84  Pulse: 118  Temp:   Resp: 19    Complications: No apparent anesthesia complications
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
## 2013-09-13 NOTE — Progress Notes (Signed)
CT complete.  Transported to room at [phone]
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
## 2013-09-14 DIAGNOSIS — I82409 Acute embolism and thrombosis of unspecified deep veins of unspecified lower extremity: Secondary | ICD-10-CM

## 2013-09-14 LAB — CBC
HCT: 35.8 % — ABNORMAL LOW (ref 39.0–52.0)
Hemoglobin: 12.8 g/dL — ABNORMAL LOW (ref 13.0–17.0)
MCH: 28.3 pg (ref 26.0–34.0)
MCHC: 35.8 g/dL (ref 30.0–36.0)
MCV: 79 fL (ref 78.0–100.0)
PLATELETS: 211 10*3/uL (ref 150–400)
RBC: 4.53 MIL/uL (ref 4.22–5.81)
RDW: 13 % (ref 11.5–15.5)
WBC: 10.8 10*3/uL — AB (ref 4.0–10.5)

## 2013-09-14 LAB — BASIC METABOLIC PANEL
BUN: 8 mg/dL (ref 6–23)
CHLORIDE: 99 meq/L (ref 96–112)
CO2: 26 mEq/L (ref 19–32)
Calcium: 8.4 mg/dL (ref 8.4–10.5)
Creatinine, Ser: 0.88 mg/dL (ref 0.50–1.35)
GFR calc Af Amer: >90 mL/min (ref >90)
GFR calc non Af Amer: >90 mL/min (ref >90)
Glucose, Bld: 116 mg/dL — ABNORMAL HIGH (ref 70–99)
Potassium: 4 mEq/L (ref 3.7–5.3)
SODIUM: 135 meq/L — AB (ref 137–147)

## 2013-09-14 NOTE — Progress Notes (Signed)
Spoke with nurses Dressing looks good No penile edema Will follow

## 2013-09-14 NOTE — Progress Notes (Signed)
Bilateral lower extremity venous duplex completed.  Right:  DVT noted in the peroneal vein.  No evidence of superficial thrombosis.  No Baker's cyst.  Left:  No evidence of DVT, superficial thrombosis, or Baker's cyst.   

## 2013-09-14 NOTE — Progress Notes (Signed)
Orthopedic Tech Progress Note Patient Details:  Carlos Lawrence 25-Dec-1985 [id number]  Casting Type of Cast: Short arm cast Cast Location: lue Cast Material: Fiberglass     Carlos Lawrence 09/14/2013, 7:23 PM
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
## 2013-09-14 NOTE — Progress Notes (Signed)
The patients guest drew the RN's attention to the patients perineal area. Located on the patients testicles are two open skin areas with minimal blood drainage. Located on the patients left inner thigh closest to the testicles is another open skin area with minimal blood drainage. The patient was moist as well in the perineal area located around the testicles. The RN performed peri-care on the patient by cleansing the open skin areas with Aloe Vesta and then Sensi-Care protective barrier cream. Please continue to assess.

## 2013-09-14 NOTE — Progress Notes (Signed)
I have seen and examined the pt and agree with PA-Osborne's progress note.  Con't clears Ambulate Await ortho eval

## 2013-09-14 NOTE — Progress Notes (Signed)
Notified PA results of vascular report.
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
## 2013-09-15 DIAGNOSIS — I82409 Acute embolism and thrombosis of unspecified deep veins of unspecified lower extremity: Secondary | ICD-10-CM

## 2013-09-15 LAB — TYPE AND SCREEN
ABO/RH(D): B POS
ANTIBODY SCREEN: NEGATIVE
UNIT DIVISION: 0
UNIT DIVISION: 0
UNIT DIVISION: 0
Unit division: 0
Unit division: 0
Unit division: 0

## 2013-09-15 LAB — BASIC METABOLIC PANEL
BUN: 6 mg/dL (ref 6–23)
CHLORIDE: 95 meq/L — AB (ref 96–112)
CO2: 27 meq/L (ref 19–32)
CREATININE: 0.82 mg/dL (ref 0.50–1.35)
Calcium: 8.8 mg/dL (ref 8.4–10.5)
GFR calc Af Amer: >90 mL/min (ref >90)
GFR calc non Af Amer: >90 mL/min (ref >90)
Glucose, Bld: 129 mg/dL — ABNORMAL HIGH (ref 70–99)
POTASSIUM: 4.1 meq/L (ref 3.7–5.3)
Sodium: 134 mEq/L — ABNORMAL LOW (ref 137–147)

## 2013-09-15 LAB — CBC
HEMATOCRIT: 35.1 % — AB (ref 39.0–52.0)
Hemoglobin: 12.5 g/dL — ABNORMAL LOW (ref 13.0–17.0)
MCH: 28.1 pg (ref 26.0–34.0)
MCHC: 35.6 g/dL (ref 30.0–36.0)
MCV: 78.9 fL (ref 78.0–100.0)
Platelets: 199 10*3/uL (ref 150–400)
RBC: 4.45 MIL/uL (ref 4.22–5.81)
RDW: 13 % (ref 11.5–15.5)
WBC: 8.5 10*3/uL (ref 4.0–10.5)

## 2013-09-15 MED ORDER — OXYCODONE HCL 5 MG PO TABS
5.0000 mg | ORAL_TABLET | ORAL | Status: DC | PRN
  Administered 2013-09-15: 10 mg via ORAL
  Administered 2013-09-16 – 2013-09-17 (×4): 20 mg via ORAL
  Filled 2013-09-15 (×5): qty 4
  Filled 2013-09-15: qty 2

## 2013-09-15 MED ORDER — OXYCODONE HCL 5 MG PO TABS
5.0000 mg | ORAL_TABLET | ORAL | Status: DC | PRN
Start: 2013-09-15 — End: 2013-09-15
  Administered 2013-09-15: 10 mg via ORAL
  Filled 2013-09-15: qty 2

## 2013-09-15 MED ORDER — ASPIRIN EC 325 MG PO TBEC
325.0000 mg | DELAYED_RELEASE_TABLET | Freq: Every day | ORAL | Status: DC
  Administered 2013-09-15 – 2013-09-16 (×2): 325 mg via ORAL
  Filled 2013-09-15 (×3): qty 1

## 2013-09-15 MED ORDER — POLYETHYLENE GLYCOL 3350 17 G PO PACK
17.0000 g | PACK | Freq: Once | ORAL | Status: AC
Start: 2013-09-15 — End: 2013-09-15
  Administered 2013-09-15: 17 g via ORAL
  Filled 2013-09-15: qty 1

## 2013-09-15 NOTE — Progress Notes (Signed)
  I have interviewed and examined this patient this morning. I agree with the assessment and treatment plan outlined by Ms. Maxwell Caul, [place].  Edsel Petrin. Dalbert Batman, M.D., [facility] General and Minimally invasive Surgery Breast and Colorectal Surgery Office:   [phone] Pager:   [phone]
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
## 2013-09-16 NOTE — Progress Notes (Signed)
Pt concerned about blister on head of penis. Blister remains intact. No drainage present.
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
## 2013-09-16 NOTE — Progress Notes (Signed)
Clinical Social Work Department BRIEF PSYCHOSOCIAL ASSESSMENT 09/16/2013  Patient:  Carlos Lawrence, Carlos Lawrence     Account Number:  [id number]     Admit date:  09/13/2013  Clinical Social Worker:  Ulyess Blossom  Date/Time:  09/16/2013 04:27 PM  Referred by:  Physician  Date Referred:  09/16/2013 Referred for  Psychosocial assessment   Other Referral:   Interview type:  Patient Other interview type:    PSYCHOSOCIAL DATA Living Status:  FAMILY Admitted from facility:   Level of care:   Primary support name:  Carlos Lawrence Primary support relationship to patient:  PARENT Degree of support available:   Pt reportd good family support.    CURRENT CONCERNS Current Concerns  Adjustment to Illness   Other Concerns:    SOCIAL WORK ASSESSMENT / PLAN CSW spoke briefly with pt. his mother and uncles re: role of CSW/dcp.  Pt lives at home with mother and was independent with ADLs pta.  Pt plans on returning home with mother at d/c.  Mother states family will be able to provide care at d/c, as necessary. Pt encouraged to ask for CSW if pertinent needs should arise. Pt is uninsured.  CSW will continue to follow.   Assessment/plan status:  Psychosocial Support/Ongoing Assessment of Needs Other assessment/ plan:   Information/referral to community resources:    PATIENT'S/FAMILY'S RESPONSE TO PLAN OF CARE: Pt/family members not very talkative.  No questions asked. Pt difficult to engage.
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
## 2013-09-17 ENCOUNTER — Inpatient Hospital Stay (HOSPITAL_COMMUNITY): Payer: Self-pay

## 2013-09-17 ENCOUNTER — Encounter (HOSPITAL_COMMUNITY): Payer: Self-pay | Admitting: General Surgery

## 2013-09-17 DIAGNOSIS — I10 Essential (primary) hypertension: Secondary | ICD-10-CM

## 2013-09-17 DIAGNOSIS — I82409 Acute embolism and thrombosis of unspecified deep veins of unspecified lower extremity: Secondary | ICD-10-CM | POA: Diagnosis present

## 2013-09-17 DIAGNOSIS — D62 Acute posthemorrhagic anemia: Secondary | ICD-10-CM

## 2013-09-17 DIAGNOSIS — S32309A Unspecified fracture of unspecified ilium, initial encounter for closed fracture: Secondary | ICD-10-CM | POA: Diagnosis present

## 2013-09-17 DIAGNOSIS — S52502A Unspecified fracture of the lower end of left radius, initial encounter for closed fracture: Secondary | ICD-10-CM | POA: Diagnosis present

## 2013-09-17 DIAGNOSIS — S3994XA Unspecified injury of external genitals, initial encounter: Secondary | ICD-10-CM | POA: Diagnosis present

## 2013-09-17 MED ORDER — BACITRACIN ZINC 500 UNIT/GM EX OINT
TOPICAL_OINTMENT | Freq: Two times a day (BID) | CUTANEOUS | Status: DC
  Administered 2013-09-17: 11:00:00 via TOPICAL
  Administered 2013-09-18: 1 via TOPICAL
  Filled 2013-09-17: qty 28.35

## 2013-09-17 MED ORDER — ENALAPRIL MALEATE 5 MG PO TABS
5.0000 mg | ORAL_TABLET | Freq: Every day | ORAL | Status: DC
  Administered 2013-09-17 – 2013-09-18 (×2): 5 mg via ORAL
  Filled 2013-09-17 (×2): qty 1

## 2013-09-17 MED ORDER — ENOXAPARIN SODIUM 40 MG/0.4ML ~~LOC~~ SOLN: 40.0000 mg | Freq: Two times a day (BID) | SUBCUTANEOUS | Status: DC

## 2013-09-17 MED ORDER — OXYCODONE HCL 5 MG PO TABS
10.0000 mg | ORAL_TABLET | ORAL | Status: DC | PRN
  Administered 2013-09-17 (×2): 20 mg via ORAL
  Administered 2013-09-18: 10 mg via ORAL
  Administered 2013-09-18 (×2): 20 mg via ORAL
  Filled 2013-09-17 (×3): qty 4
  Filled 2013-09-17: qty 2
  Filled 2013-09-17: qty 4

## 2013-09-17 MED ORDER — POLYETHYLENE GLYCOL 3350 17 G PO PACK
17.0000 g | PACK | Freq: Every day | ORAL | Status: DC
Start: 2013-09-17 — End: 2013-09-18
  Administered 2013-09-17 – 2013-09-18 (×2): 17 g via ORAL
  Filled 2013-09-17 (×3): qty 1

## 2013-09-17 MED ORDER — HYDROMORPHONE HCL PF 1 MG/ML IJ SOLN: 0.5000 mg | INTRAMUSCULAR | Status: DC | PRN

## 2013-09-17 MED ORDER — DABIGATRAN ETEXILATE MESYLATE 150 MG PO CAPS
150.0000 mg | ORAL_CAPSULE | Freq: Two times a day (BID) | ORAL | Status: DC
  Administered 2013-09-17 (×2): 150 mg via ORAL
  Filled 2013-09-17 (×4): qty 1

## 2013-09-17 MED ORDER — DOCUSATE SODIUM 100 MG PO CAPS
100.0000 mg | ORAL_CAPSULE | Freq: Two times a day (BID) | ORAL | Status: DC
  Administered 2013-09-17 (×2): 100 mg via ORAL
  Filled 2013-09-17 (×4): qty 1
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
## 2013-09-17 NOTE — Progress Notes (Signed)
Patient examined and I agree with the assessment and plan Still having pain. Will see therapy eval and re-check this PM. Georganna Skeans, MD, MPH, FACS Trauma: [phone] General Surgery: [phone]  09/17/2013 1:05 PM
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
## 2013-09-17 NOTE — Progress Notes (Signed)
Orthopedic Tech Progress Note Patient Details:  Carlos Lawrence 17-Jun-1985 [id number]  Casting Type of Cast: Short arm cast Cast Location: LUE Cast Material: Fiberglass Cast Intervention: Removal;Re-application;Application     Braulio Bosch 09/17/2013, 8:54 PM
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
## 2013-09-18 MED ORDER — HYDROCHLOROTHIAZIDE 25 MG PO TABS: 25.0000 mg | ORAL_TABLET | Freq: Every day | ORAL | Status: DC

## 2013-09-18 MED ORDER — RIVAROXABAN 20 MG PO TABS
20.0000 mg | ORAL_TABLET | Freq: Every day | ORAL | Status: DC
  Administered 2013-09-18: 20 mg via ORAL
  Filled 2013-09-18: qty 1

## 2013-09-18 MED ORDER — OXYCODONE-ACETAMINOPHEN 10-325 MG PO TABS
1.0000 | ORAL_TABLET | ORAL | Status: DC | PRN
Start: 2013-09-18 — End: 2013-11-14

## 2013-09-18 MED ORDER — ENALAPRIL MALEATE 5 MG PO TABS: 5.0000 mg | ORAL_TABLET | Freq: Every day | ORAL | Status: DC

## 2013-09-18 MED ORDER — RIVAROXABAN 20 MG PO TABS: 20.0000 mg | ORAL_TABLET | Freq: Every day | ORAL | Status: DC

## 2013-09-18 NOTE — Discharge Summary (Signed)
Carlos Pesce, MD, MPH, FACS Trauma: [phone] General Surgery: [phone]  

## 2013-09-18 NOTE — Progress Notes (Signed)
Follow up appt made for pt [facility] w/ Dr. Liston Alba for indigent medical follow up (she is assisting the wellness clinic to get pt's seen) on June 11 at 10am.  Address, phone number all given to patient. Advised the scheduler at clinic that pt would be bringing the Xarelto form for pt assistance with him to be completed for subsequent months of medications after his 30-day free Rx runs out.  Application completed as much as I could and given to pt with explanation that he stated understanding of. MATCH provided for remaining meds (HTN, pain).  Left platform rolling walker and 3-in-1 ordered from [facility] for patient.   UR completed.  Sandi Mariscal, RN BSN [place] CCM Trauma/Neuro ICU Case Manager [phone]
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
## 2013-09-18 NOTE — Progress Notes (Signed)
Plan D/C Carlos Skeans, MD, MPH, FACS Trauma: [phone] General Surgery: [phone]

## 2013-09-19 ENCOUNTER — Encounter (HOSPITAL_COMMUNITY): Payer: Self-pay

## 2013-09-25 ENCOUNTER — Ambulatory Visit (INDEPENDENT_AMBULATORY_CARE_PROVIDER_SITE_OTHER): Payer: Self-pay | Admitting: Orthopedic Surgery

## 2013-09-25 ENCOUNTER — Encounter (INDEPENDENT_AMBULATORY_CARE_PROVIDER_SITE_OTHER): Payer: Self-pay

## 2013-09-25 DIAGNOSIS — W3400XA Accidental discharge from unspecified firearms or gun, initial encounter: Secondary | ICD-10-CM

## 2013-09-25 DIAGNOSIS — S31139A Puncture wound of abdominal wall without foreign body, unspecified quadrant without penetration into peritoneal cavity, initial encounter: Principal | ICD-10-CM

## 2013-09-25 DIAGNOSIS — S31109A Unspecified open wound of abdominal wall, unspecified quadrant without penetration into peritoneal cavity, initial encounter: Secondary | ICD-10-CM

## 2013-09-25 NOTE — Patient Instructions (Signed)
To get your bowels moving, get the following at any pharmacy or drug store:  Colace: 200mg  twice daily Miralax: 17g once daily  Take above until you stop taking pain medication.  Take magnesium citrate: 1 bottle every 12 hours until you have a bowel movement, then stop.   No lifting more than 5 pounds for 6 weeks from date of surgery.  No driving while taking oxycodone.
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
## 2013-10-01 ENCOUNTER — Telehealth (HOSPITAL_COMMUNITY): Payer: Self-pay

## 2013-10-02 NOTE — Telephone Encounter (Signed)
3 weeks post op, received 168 tablets on 5/27.  Will reduce dose to 1 tab q6h prn of percocet over the next 2 weeks and then wean off.  Alternative medication such as ibuprofen, tylenol, heat pads to be utilized.  Follow up in trauma office if needed.  Message left for the patient to pick up rx in trauma office.  Elliona Doddridge, ANP-BC

## 2013-10-02 NOTE — Progress Notes (Signed)
Received message from Trauma NP that the patient's mother, Carron Curie, had called to state that the patient needed another copy of the letter for medication assistance like he was given at discharge because he had lost his. When I do a patient inquiry in the [facility] (medication assistance) database, there is a claim in there that the patient already used his med assistance benefit on 09/18/2013.  This program is only allowed to be used 1 time every 12 months so the patient is not eligible to use it again until 09/19/2014.  I called Ms. Eulas Post back at the number she left, [phone], and left a voicemail explaining this to her and left the trauma office number [phone]) if she had further questions.  Updated the NP on the outcome.

## 2013-10-03 ENCOUNTER — Ambulatory Visit: Payer: Self-pay | Admitting: Internal Medicine

## 2013-10-16 MED ORDER — TRAMADOL HCL 50 MG PO TABS: 50.0000 mg | ORAL_TABLET | Freq: Four times a day (QID) | ORAL | Status: DC | PRN
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
## 2013-10-18 ENCOUNTER — Telehealth (HOSPITAL_COMMUNITY): Payer: Self-pay
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
## 2013-11-14 ENCOUNTER — Encounter: Payer: Self-pay | Admitting: Internal Medicine

## 2013-11-14 ENCOUNTER — Ambulatory Visit (INDEPENDENT_AMBULATORY_CARE_PROVIDER_SITE_OTHER): Payer: Self-pay | Admitting: Internal Medicine

## 2013-11-14 VITALS — BP 128/77 | HR 64 | Temp 98.3°F | Resp 14 | Ht 68.0 in | Wt 178.0 lb

## 2013-11-14 DIAGNOSIS — Z0000 Encounter for general adult medical examination without abnormal findings: Secondary | ICD-10-CM

## 2013-11-14 DIAGNOSIS — Z9889 Other specified postprocedural states: Secondary | ICD-10-CM

## 2013-11-14 DIAGNOSIS — I158 Other secondary hypertension: Secondary | ICD-10-CM

## 2013-11-14 DIAGNOSIS — D649 Anemia, unspecified: Secondary | ICD-10-CM

## 2013-11-14 DIAGNOSIS — R109 Unspecified abdominal pain: Secondary | ICD-10-CM

## 2013-11-14 DIAGNOSIS — R7309 Other abnormal glucose: Secondary | ICD-10-CM

## 2013-11-14 DIAGNOSIS — R739 Hyperglycemia, unspecified: Secondary | ICD-10-CM

## 2013-11-14 DIAGNOSIS — I82401 Acute embolism and thrombosis of unspecified deep veins of right lower extremity: Secondary | ICD-10-CM

## 2013-11-14 DIAGNOSIS — I82409 Acute embolism and thrombosis of unspecified deep veins of unspecified lower extremity: Secondary | ICD-10-CM

## 2013-11-14 LAB — LIPID PANEL
CHOL/HDL RATIO: 3.3 ratio
CHOLESTEROL: 164 mg/dL (ref 0–200)
HDL: 50 mg/dL (ref >39)
LDL Cholesterol: 82 mg/dL (ref 0–99)
Triglycerides: 162 mg/dL — ABNORMAL HIGH (ref <150)
VLDL: 32 mg/dL (ref 0–40)

## 2013-11-14 LAB — CBC WITH DIFFERENTIAL/PLATELET
BASOS ABS: 0 10*3/uL (ref 0.0–0.1)
Basophils Relative: 1 % (ref 0–1)
EOS ABS: 0.2 10*3/uL (ref 0.0–0.7)
Eosinophils Relative: 4 % (ref 0–5)
HCT: 41.5 % (ref 39.0–52.0)
Hemoglobin: 14.5 g/dL (ref 13.0–17.0)
LYMPHS ABS: 1.3 10*3/uL (ref 0.7–4.0)
LYMPHS PCT: 30 % (ref 12–46)
MCH: 27.4 pg (ref 26.0–34.0)
MCHC: 34.9 g/dL (ref 30.0–36.0)
MCV: 78.4 fL (ref 78.0–100.0)
Monocytes Absolute: 0.3 10*3/uL (ref 0.1–1.0)
Monocytes Relative: 7 % (ref 3–12)
NEUTROS PCT: 58 % (ref 43–77)
Neutro Abs: 2.4 10*3/uL (ref 1.7–7.7)
PLATELETS: 280 10*3/uL (ref 150–400)
RBC: 5.29 MIL/uL (ref 4.22–5.81)
RDW: 15.1 % (ref 11.5–15.5)
WBC: 4.2 10*3/uL (ref 4.0–10.5)

## 2013-11-14 LAB — COMPREHENSIVE METABOLIC PANEL
ALBUMIN: 4.3 g/dL (ref 3.5–5.2)
AST: 15 U/L (ref 0–37)
Alkaline Phosphatase: 55 U/L (ref 39–117)
BUN: 7 mg/dL (ref 6–23)
CHLORIDE: 105 meq/L (ref 96–112)
CO2: 23 meq/L (ref 19–32)
Calcium: 9.3 mg/dL (ref 8.4–10.5)
Creat: 0.8 mg/dL (ref 0.50–1.35)
GLUCOSE: 94 mg/dL (ref 70–99)
POTASSIUM: 4.3 meq/L (ref 3.5–5.3)
SODIUM: 138 meq/L (ref 135–145)
TOTAL PROTEIN: 7.2 g/dL (ref 6.0–8.3)
Total Bilirubin: 0.4 mg/dL (ref 0.2–1.2)

## 2013-11-14 LAB — HEMOGLOBIN A1C
HEMOGLOBIN A1C: 5.5 % (ref <5.7)
MEAN PLASMA GLUCOSE: 111 mg/dL (ref <117)

## 2013-11-14 MED ORDER — OXYCODONE-ACETAMINOPHEN 10-325 MG PO TABS: 1.0000 | ORAL_TABLET | ORAL | Status: DC | PRN

## 2013-11-14 MED ORDER — RIVAROXABAN 20 MG PO TABS: 20.0000 mg | ORAL_TABLET | Freq: Every day | ORAL | Status: DC
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
## 2013-11-15 LAB — URINALYSIS
BILIRUBIN URINE: NEGATIVE
Glucose, UA: NEGATIVE mg/dL
HGB URINE DIPSTICK: NEGATIVE
Ketones, ur: NEGATIVE mg/dL
LEUKOCYTES UA: NEGATIVE
Nitrite: NEGATIVE
PH: 7.5 (ref 5.0–8.0)
PROTEIN: NEGATIVE mg/dL
SPECIFIC GRAVITY, URINE: 1.027 (ref 1.005–1.030)
Urobilinogen, UA: 0.2 mg/dL (ref 0.0–1.0)

## 2013-11-18 ENCOUNTER — Encounter (HOSPITAL_COMMUNITY): Payer: Self-pay | Admitting: Emergency Medicine

## 2013-11-18 ENCOUNTER — Emergency Department (HOSPITAL_COMMUNITY)
Admission: EM | Admit: 2013-11-18 | Discharge: 2013-11-19 | Disposition: A | Discharge status: Home / Self Care | Payer: Self-pay | Attending: Emergency Medicine | Admitting: Emergency Medicine

## 2013-11-18 ENCOUNTER — Emergency Department (HOSPITAL_COMMUNITY): Payer: Self-pay

## 2013-11-18 DIAGNOSIS — Z79899 Other long term (current) drug therapy: Secondary | ICD-10-CM | POA: Insufficient documentation

## 2013-11-18 DIAGNOSIS — R197 Diarrhea, unspecified: Secondary | ICD-10-CM | POA: Insufficient documentation

## 2013-11-18 DIAGNOSIS — Z7901 Long term (current) use of anticoagulants: Secondary | ICD-10-CM | POA: Insufficient documentation

## 2013-11-18 DIAGNOSIS — R1013 Epigastric pain: Secondary | ICD-10-CM | POA: Insufficient documentation

## 2013-11-18 DIAGNOSIS — R1110 Vomiting, unspecified: Secondary | ICD-10-CM | POA: Insufficient documentation

## 2013-11-18 DIAGNOSIS — J45909 Unspecified asthma, uncomplicated: Secondary | ICD-10-CM | POA: Insufficient documentation

## 2013-11-18 DIAGNOSIS — F17200 Nicotine dependence, unspecified, uncomplicated: Secondary | ICD-10-CM | POA: Insufficient documentation

## 2013-11-18 LAB — CBC WITH DIFFERENTIAL/PLATELET
BASOS ABS: 0 10*3/uL (ref 0.0–0.1)
BASOS PCT: 0 % (ref 0–1)
EOS PCT: 3 % (ref 0–5)
Eosinophils Absolute: 0.2 10*3/uL (ref 0.0–0.7)
HEMATOCRIT: 39.9 % (ref 39.0–52.0)
Hemoglobin: 14.2 g/dL (ref 13.0–17.0)
Lymphocytes Relative: 30 % (ref 12–46)
Lymphs Abs: 1.9 10*3/uL (ref 0.7–4.0)
MCH: 27.5 pg (ref 26.0–34.0)
MCHC: 35.6 g/dL (ref 30.0–36.0)
MCV: 77.3 fL — AB (ref 78.0–100.0)
MONO ABS: 0.6 10*3/uL (ref 0.1–1.0)
Monocytes Relative: 9 % (ref 3–12)
Neutro Abs: 3.7 10*3/uL (ref 1.7–7.7)
Neutrophils Relative %: 58 % (ref 43–77)
Platelets: 264 10*3/uL (ref 150–400)
RBC: 5.16 MIL/uL (ref 4.22–5.81)
RDW: 13.8 % (ref 11.5–15.5)
WBC: 6.3 10*3/uL (ref 4.0–10.5)

## 2013-11-18 LAB — COMPREHENSIVE METABOLIC PANEL
ALBUMIN: 4.1 g/dL (ref 3.5–5.2)
ALT: 7 U/L (ref 0–53)
AST: 16 U/L (ref 0–37)
Alkaline Phosphatase: 60 U/L (ref 39–117)
Anion gap: 11 (ref 5–15)
BUN: 9 mg/dL (ref 6–23)
CALCIUM: 9.4 mg/dL (ref 8.4–10.5)
CO2: 25 mEq/L (ref 19–32)
CREATININE: 0.86 mg/dL (ref 0.50–1.35)
Chloride: 100 mEq/L (ref 96–112)
GFR calc Af Amer: >90 mL/min (ref >90)
Glucose, Bld: 87 mg/dL (ref 70–99)
Potassium: 4.1 mEq/L (ref 3.7–5.3)
Sodium: 136 mEq/L — ABNORMAL LOW (ref 137–147)
Total Bilirubin: 0.5 mg/dL (ref 0.3–1.2)
Total Protein: 7.6 g/dL (ref 6.0–8.3)

## 2013-11-18 LAB — I-STAT CG4 LACTIC ACID, ED: LACTIC ACID, VENOUS: 0.58 mmol/L (ref 0.5–2.2)

## 2013-11-18 LAB — LIPASE, BLOOD: LIPASE: 15 U/L (ref 11–59)

## 2013-11-18 MED ORDER — ONDANSETRON HCL 4 MG/2ML IJ SOLN
4.0000 mg | Freq: Once | INTRAMUSCULAR | Status: AC
  Administered 2013-11-18: 4 mg via INTRAVENOUS
  Filled 2013-11-18: qty 2

## 2013-11-18 MED ORDER — IOHEXOL 300 MG/ML  SOLN
25.0000 mL | INTRAMUSCULAR | Status: AC
  Administered 2013-11-18: 25 mL via ORAL

## 2013-11-18 MED ORDER — SODIUM CHLORIDE 0.9 % IV BOLUS (SEPSIS)
1000.0000 mL | Freq: Once | INTRAVENOUS | Status: AC
  Administered 2013-11-18: 1000 mL via INTRAVENOUS

## 2013-11-18 NOTE — ED Notes (Signed)
Pt. reports emesis , diarrhea and mid abdominal pain onset 2 days ago , denies fever or chills. No urinary discomfort .
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
## 2013-11-19 ENCOUNTER — Emergency Department (HOSPITAL_COMMUNITY): Payer: Self-pay

## 2013-11-19 LAB — URINALYSIS, ROUTINE W REFLEX MICROSCOPIC
Bilirubin Urine: NEGATIVE
GLUCOSE, UA: NEGATIVE mg/dL
Hgb urine dipstick: NEGATIVE
Ketones, ur: 15 mg/dL — AB
LEUKOCYTES UA: NEGATIVE
NITRITE: NEGATIVE
PH: 6 (ref 5.0–8.0)
PROTEIN: NEGATIVE mg/dL
Specific Gravity, Urine: 1.02 (ref 1.005–1.030)
Urobilinogen, UA: 1 mg/dL (ref 0.0–1.0)

## 2013-11-19 MED ORDER — IOHEXOL 300 MG/ML  SOLN
100.0000 mL | Freq: Once | INTRAMUSCULAR | Status: AC | PRN
  Administered 2013-11-19: 100 mL via INTRAVENOUS

## 2013-11-19 MED ORDER — OXYCODONE-ACETAMINOPHEN 5-325 MG PO TABS
1.0000 | ORAL_TABLET | Freq: Once | ORAL | Status: AC
  Administered 2013-11-19: 1 via ORAL
  Filled 2013-11-19: qty 1

## 2013-11-19 MED ORDER — MORPHINE SULFATE 4 MG/ML IJ SOLN: 4.0000 mg | Freq: Once | INTRAMUSCULAR | Status: DC
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
## 2013-11-19 NOTE — Discharge Instructions (Signed)

## 2013-11-20 NOTE — ED Provider Notes (Signed)
I saw and evaluated the patient, reviewed the resident's note and I agree with the findings and plan.  Pt with vomiting and diarrhea.  Prior ex lap.  On my exam did have diffuse tenderness without guarding.  CT scan ordered to evaluate for possible obstruction.  Dorie Rank, MD 11/20/13 [id number]

## 2013-11-21 ENCOUNTER — Ambulatory Visit: Payer: Self-pay

## 2013-11-21 VITALS — BP 122/76 | HR 76

## 2013-11-21 DIAGNOSIS — I10 Essential (primary) hypertension: Secondary | ICD-10-CM

## 2013-12-03 ENCOUNTER — Telehealth: Payer: Self-pay | Admitting: Internal Medicine

## 2013-12-03 NOTE — Telephone Encounter (Signed)
Left message for patient to give [place] a call back in regards to prescription request.  See note below from Dr. Zigmund Daniel.

## 2013-12-03 NOTE — Telephone Encounter (Signed)
Medication refill request for oxyCODONE-acetaminophen (PERCOCET) 10-325 MG  / LOV 11/14/2013 /

## 2013-12-03 NOTE — Telephone Encounter (Signed)
He needs to obtain his pain medications from Surgeons. He was given a prescription on his last visit only on a one time basis until he was able to see the surgeons.

## 2013-12-23 ENCOUNTER — Ambulatory Visit: Payer: Self-pay | Admitting: Internal Medicine

## 2014-03-02 ENCOUNTER — Emergency Department (HOSPITAL_COMMUNITY): Payer: Self-pay

## 2014-03-02 ENCOUNTER — Emergency Department (HOSPITAL_COMMUNITY)
Admission: EM | Admit: 2014-03-02 | Discharge: 2014-03-02 | Disposition: A | Discharge status: Home / Self Care | Payer: Self-pay | Attending: Emergency Medicine | Admitting: Emergency Medicine

## 2014-03-02 ENCOUNTER — Encounter (HOSPITAL_COMMUNITY): Payer: Self-pay | Admitting: Emergency Medicine

## 2014-03-02 DIAGNOSIS — R51 Headache: Secondary | ICD-10-CM | POA: Insufficient documentation

## 2014-03-02 DIAGNOSIS — M25559 Pain in unspecified hip: Secondary | ICD-10-CM | POA: Insufficient documentation

## 2014-03-02 DIAGNOSIS — R109 Unspecified abdominal pain: Secondary | ICD-10-CM

## 2014-03-02 DIAGNOSIS — R05 Cough: Secondary | ICD-10-CM | POA: Insufficient documentation

## 2014-03-02 DIAGNOSIS — R059 Cough, unspecified: Secondary | ICD-10-CM

## 2014-03-02 DIAGNOSIS — Z720 Tobacco use: Secondary | ICD-10-CM | POA: Insufficient documentation

## 2014-03-02 DIAGNOSIS — Z79899 Other long term (current) drug therapy: Secondary | ICD-10-CM | POA: Insufficient documentation

## 2014-03-02 DIAGNOSIS — J45909 Unspecified asthma, uncomplicated: Secondary | ICD-10-CM | POA: Insufficient documentation

## 2014-03-02 DIAGNOSIS — Z7901 Long term (current) use of anticoagulants: Secondary | ICD-10-CM | POA: Insufficient documentation

## 2014-03-02 LAB — COMPREHENSIVE METABOLIC PANEL
ALBUMIN: 4.1 g/dL (ref 3.5–5.2)
ALT: 8 U/L (ref 0–53)
AST: 23 U/L (ref 0–37)
Alkaline Phosphatase: 55 U/L (ref 39–117)
Anion gap: 14 (ref 5–15)
BUN: 11 mg/dL (ref 6–23)
CALCIUM: 9.6 mg/dL (ref 8.4–10.5)
CO2: 24 mEq/L (ref 19–32)
CREATININE: 0.87 mg/dL (ref 0.50–1.35)
Chloride: 101 mEq/L (ref 96–112)
GFR calc Af Amer: >90 mL/min (ref >90)
GFR calc non Af Amer: >90 mL/min (ref >90)
Glucose, Bld: 86 mg/dL (ref 70–99)
Potassium: 4.1 mEq/L (ref 3.7–5.3)
Sodium: 139 mEq/L (ref 137–147)
TOTAL PROTEIN: 7.6 g/dL (ref 6.0–8.3)
Total Bilirubin: 0.5 mg/dL (ref 0.3–1.2)

## 2014-03-02 LAB — URINALYSIS, ROUTINE W REFLEX MICROSCOPIC
Bilirubin Urine: NEGATIVE
Glucose, UA: NEGATIVE mg/dL
Hgb urine dipstick: NEGATIVE
Ketones, ur: 15 mg/dL — AB
LEUKOCYTES UA: NEGATIVE
NITRITE: NEGATIVE
PH: 6 (ref 5.0–8.0)
Protein, ur: NEGATIVE mg/dL
Specific Gravity, Urine: 1.029 (ref 1.005–1.030)
Urobilinogen, UA: 0.2 mg/dL (ref 0.0–1.0)

## 2014-03-02 LAB — CBC WITH DIFFERENTIAL/PLATELET
BASOS ABS: 0 10*3/uL (ref 0.0–0.1)
Basophils Relative: 0 % (ref 0–1)
EOS ABS: 0.1 10*3/uL (ref 0.0–0.7)
EOS PCT: 1 % (ref 0–5)
HCT: 40.7 % (ref 39.0–52.0)
Hemoglobin: 14.7 g/dL (ref 13.0–17.0)
Lymphocytes Relative: 21 % (ref 12–46)
Lymphs Abs: 1.5 10*3/uL (ref 0.7–4.0)
MCH: 27.6 pg (ref 26.0–34.0)
MCHC: 36.1 g/dL — AB (ref 30.0–36.0)
MCV: 76.4 fL — AB (ref 78.0–100.0)
MONO ABS: 0.4 10*3/uL (ref 0.1–1.0)
Monocytes Relative: 6 % (ref 3–12)
NEUTROS ABS: 5.1 10*3/uL (ref 1.7–7.7)
Neutrophils Relative %: 72 % (ref 43–77)
Platelets: 271 10*3/uL (ref 150–400)
RBC: 5.33 MIL/uL (ref 4.22–5.81)
RDW: 14.4 % (ref 11.5–15.5)
WBC: 7.1 10*3/uL (ref 4.0–10.5)

## 2014-03-02 LAB — LACTIC ACID, PLASMA: Lactic Acid, Venous: 0.7 mmol/L (ref 0.5–2.2)

## 2014-03-02 LAB — LIPASE, BLOOD: LIPASE: 21 U/L (ref 11–59)

## 2014-03-02 MED ORDER — SODIUM CHLORIDE 0.9 % IV BOLUS (SEPSIS)
1000.0000 mL | Freq: Once | INTRAVENOUS | Status: AC
  Administered 2014-03-02: 1000 mL via INTRAVENOUS

## 2014-03-02 MED ORDER — ONDANSETRON 4 MG PO TBDP: ORAL_TABLET | ORAL | Status: DC

## 2014-03-02 MED ORDER — HYDROMORPHONE HCL 1 MG/ML IJ SOLN
1.0000 mg | Freq: Once | INTRAMUSCULAR | Status: AC
  Administered 2014-03-02: 1 mg via INTRAVENOUS
  Filled 2014-03-02: qty 1

## 2014-03-02 MED ORDER — IOHEXOL 300 MG/ML  SOLN
100.0000 mL | Freq: Once | INTRAMUSCULAR | Status: AC | PRN
  Administered 2014-03-02: 100 mL via INTRAVENOUS

## 2014-03-02 MED ORDER — IOHEXOL 300 MG/ML  SOLN
25.0000 mL | Freq: Once | INTRAMUSCULAR | Status: AC | PRN
  Administered 2014-03-02: 25 mL via ORAL

## 2014-03-02 MED ORDER — ONDANSETRON HCL 4 MG/2ML IJ SOLN
4.0000 mg | Freq: Once | INTRAMUSCULAR | Status: AC
  Administered 2014-03-02: 4 mg via INTRAVENOUS
  Filled 2014-03-02: qty 2

## 2014-03-02 NOTE — ED Notes (Signed)
Pt c/o left hip pain and abdominal pain ongoing for a couple of months. Pt had surgery on his Abdomen in May. Pt reports headache onset last night.

## 2014-03-02 NOTE — ED Notes (Signed)
Pt states he is not able to urinate at this time.  

## 2014-03-02 NOTE — Discharge Instructions (Signed)
Abdominal Pain Many things can cause abdominal pain. Usually, abdominal pain is not caused by a disease and will improve without treatment. It can often be observed and treated at home. Your health care provider will do a physical exam and possibly order blood tests and X-rays to help determine the seriousness of your pain. However, in many cases, more time must pass before a clear cause of the pain can be found. Before that point, your health care provider may not know if you need more testing or further treatment. HOME CARE INSTRUCTIONS  Monitor your abdominal pain for any changes. The following actions may help to alleviate any discomfort you are experiencing:  Only take over-the-counter or prescription medicines as directed by your health care provider.  Do not take laxatives unless directed to do so by your health care provider.  Try a clear liquid diet (broth, tea, or water) as directed by your health care provider. Slowly move to a bland diet as tolerated. SEEK MEDICAL CARE IF:  You have unexplained abdominal pain.  You have abdominal pain associated with nausea or diarrhea.  You have pain when you urinate or have a bowel movement.  You experience abdominal pain that wakes you in the night.  You have abdominal pain that is worsened or improved by eating food.  You have abdominal pain that is worsened with eating fatty foods.  You have a fever. SEEK IMMEDIATE MEDICAL CARE IF:   Your pain does not go away within 2 hours.  You keep throwing up (vomiting).  Your pain is felt only in portions of the abdomen, such as the right side or the left lower portion of the abdomen.  You pass bloody or black tarry stools. MAKE SURE YOU:  Understand these instructions.   Will watch your condition.   Will get help right away if you are not doing well or get worse.  Document Released: 01/19/2005 Document Revised: 04/16/2013 Document Reviewed: 12/19/2012 [facility] Patient Information  2015 [place], [place]. This information is not intended to replace advice given to you by your health care provider. Make sure you discuss any questions you have with your health care provider.   Emergency Department Resource Guide 1) Find a Doctor and Pay Out of Pocket Although you won't have to find out who is covered by your insurance plan, it is a good idea to ask around and get recommendations. You will then need to call the office and see if the doctor you have chosen will accept you as a new patient and what types of options they offer for patients who are self-pay. Some doctors offer discounts or will set up payment plans for their patients who do not have insurance, but you will need to ask so you aren't surprised when you get to your appointment.  2) Contact Your Local Health Department Not all health departments have doctors that can see patients for sick visits, but many do, so it is worth a call to see if yours does. If you don't know where your local health department is, you can check in your phone book. The CDC also has a tool to help you locate your state's health department, and many state websites also have listings of all of their local health departments.  3) Find a [facility] If your illness is not likely to be very severe or complicated, you may want to try a walk in clinic. These are popping up all over the country in pharmacies, drugstores, and shopping centers. They're  usually staffed by nurse practitioners or physician assistants that have been trained to treat common illnesses and complaints. They're usually fairly quick and inexpensive. However, if you have serious medical issues or chronic medical problems, these are probably not your best option.  No Primary Care Doctor: - Call Health Connect at  [phone] - they can help you locate a primary care doctor that  accepts your insurance, provides certain services, etc. - Physician Referral Service- [phone]  Chronic  Pain Problems: Organization         Address  Phone   Notes  [facility]  [phone] Patients need to be referred by their primary care doctor.   Medication Assistance: Organization         Address  Phone   Notes  [facility] Medication [facility] [address] [place], [place] [address] [phone] --Must be a resident of [facility] -- Must have NO insurance coverage whatsoever (no Medicaid/ Medicare, etc.) -- The pt. MUST have a primary care doctor that directs their care regularly and follows them in the community   [facility]ist  [phone]   [employer]  [phone]    Agencies that provide inexpensive medical care: Organization         Address  Phone   Notes  [place]  [phone]   [facility]    [phone]   [facility] [place],  [address] [phone]   [place] [address] [place] [phone]   Planned Parenthood    [phone]   [facility]    [phone]   [place] and [place] [address], [place] Phone:  [phone], Fax:  [phone] Hours of Operation:  9 am - 6 pm, M-F.  Also accepts Medicaid/Medicare and self-pay.  [facility] for [place], [address], [place] Phone: [phone], Fax: [phone]. Hours of Operation:  8:30 am - 5:30 pm, M-F.  Also accepts Medicaid and self-pay.  [facility] [address], [place] Phone: [phone]   [place], [place], [place] [phone], Ext. [phone] Mondays & Thursdays: 7-9 AM.  First 15 patients are seen on a first come, first serve basis.    [place] Providers:  Organization         Address  Phone   Notes  [facility] [address], [place] [phone] Also  accepts self-pay patients.  [facility] [id number] [place], [place]  [phone]   [place], [address], [place] [phone]   [facility] [address] [place] [phone]   Lucianne Lei [address], [place]   [phone] Only accepts [place] Access [place] patients after they have their name applied to their card.   Self-Pay (no insurance) in [facility]:  Organization         Address  Phone   Notes  Sickle Cell Patients, [facility] Internal Medicine [place] [phone]   [facility] [place] [phone]   [facility] Urgent [place]  [place], [address], [place] [phone]   [facility]/Dr. Osei-Bonsu  [address], [place] or [address], [place] [phone] Phone number for both [place] and [place] locations is the same.  [facility] [address], [place] [phone]   [facility] [address], [place] or [address] [phone]   [facility] [address] [place] [phone], phone; [phone], fax Sees patients 1st and 3rd Saturday of every month.  Must not qualify for public or private insurance (i.e. Medicaid, Medicare, [place] Health Choice, Veterans' Benefits)  Household income should be no more than 200% of the poverty level The clinic cannot treat you if you are pregnant or think you are pregnant  Sexually transmitted diseases are not treated at the clinic.    Dental Care: Organization         Address  Phone  Notes  [facility] Department of [facility] [place] [phone] Accepts children up to age 33 who are enrolled in [place] or [place]; pregnant  women with a Medicaid card; and children who have applied for Medicaid or [place] Health Choice, but were declined, whose parents can pay a reduced fee at time of service.  [facility] Department of [facility]  [address], [place] [phone] Accepts children up to age 48 who are enrolled in [place] or [place]; pregnant women with a Medicaid card; and children who have applied for Medicaid or [place] Health Choice, but were declined, whose parents can pay a reduced fee at time of service.  [place] Adult Dental Access PROGRAM  [place] [phone] Patients are seen by appointment only. Walk-ins are not accepted. [place] will see patients 42 years of age and older. Monday - Tuesday (8am-5pm) Most Wednesdays (8:30-5pm) $30 per visit, cash only  [facility] Adult Dental Access PROGRAM  [address], [facility] [phone] Patients are seen by appointment only. Walk-ins are not accepted. [place] will see patients 31 years of age and older. One Wednesday Evening (Monthly: Volunteer Based).  $30 per visit, cash only  [place]  [phone] for adults; Children under age 65, call Graduate Pediatric Dentistry at [phone]. Children aged 5-14, please call [phone] to request a pediatric application.  Dental services are provided in all areas of dental care including fillings, crowns and bridges, complete and partial dentures, implants, gum treatment, root canals, and extractions. Preventive care is also provided. Treatment is provided to both adults and children. Patients are selected via a lottery and there is often a waiting list.   [facility] [address], [place]  [phone] [email or web address]   [facility] [address] [place], [place] [phone], Ext. [phone] Second and Fourth Thursday of each month, opens at 6:30 AM; Clinic ends at 9 AM.  Patients are  seen on a first-come first-served basis, and a limited number are seen during each clinic.   [facility]  [address] Hillard Danker [place], [place] [phone]   Eligibility Requirements You must have lived in [place], [place], or [place] counties for at least the last three months.   You cannot be eligible for state or federal sponsored [employer], including [employer], [place], or [employer].   You generally cannot be eligible for healthcare insurance through  your employer.    How to apply: Eligibility screenings are held every Tuesday and Wednesday afternoon from 1:00 pm until 4:00 pm. You do not need an appointment for the interview!  [facility] [address] [place], [place]   [place]  [place] Department  [place]  [phone]    Behavioral Health Resources in the Community: Intensive Outpatient Programs Organization         Address  Phone  Notes  [place]. [address] [place], [place] [phone]   [facility] Outpatient [address] [place], [place]   ADS: Alcohol & Drug Svcs [address], [place], [place]   [place] [address],  [place], [place] or [phone]   Substance Abuse Resources Organization         Address  Phone  Notes  Alcohol and Drug Services  [phone]   [place]  [phone]   [place]   Chinita Pester  [phone]   Residential & Outpatient Substance Abuse Program  [phone]   Psychological Services Organization         Address  Phone  Notes  [facility] [place]  [place]  [phone]   [place] [address], [place] or [phone]    Mobile Crisis  Teams Organization         Address  Phone  Notes  [facility], Mobile Crisis Care Unit  [phone]   Assertive Psychotherapeutic Services  [address] [place], [place]   [place] [address], [place] [phone]    Self-Help/Support Groups Organization         Address  Phone             Notes  [place]. of [place] - variety of support groups  [place] Call for more information  Narcotics Anonymous (NA), [facility] [address], [employer] [place]  2 meetings at this location   Special educational needs teacher         Address  Phone  Notes  [facility]idential Treatment [place],    [place]  [phone]   [facility]  [address], [place] [id number], [place], [place]   [place], [place] [phone] Admissions: 8am-3pm M-F  Incentives Substance [place] [address],    [place], [place] [phone]   [facility] [address] [place], [place], [place]   [facility] [address],  [place], [place]   [facility] - Intensive Outpatient [address], [place], [place]   [facility] ([place].,  [place], [place] [phone] or [phone]   [facility] Services (RTS) [address] [place], [place] Accepts Medicaid  [facility] [place] [address],  [place] [phone] Substance Abuse/Addiction Treatment   [facility] Organization         Address  Phone  Notes  [facility]  [phone]   Domenic Schwab, PhD [address] [place], [place]   [phone] or [phone]   [place], [place] [phone]  [facility] [address], [place], [place] [phone]  Insurance/Medicaid/sponsorship through [facility]  °[facility] [address]                                    [place], [place] [phone] Therapy/tele-psych/case  °[facility] [address]  ° [place], [place] [phone]    °Dr. Arfeen  [phone]   °[facility] [place]  [employer] [place] [facility] 1) [address], [place] °2) [address], [place] °3)  [address], [place] [phone] °([phone] ° °([phone]   °[place] Child Abuse Hotline [phone]) [phone] or (336) [phone] (After Hours)    ° ° ° ° °
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
## 2014-04-03 ENCOUNTER — Emergency Department (HOSPITAL_COMMUNITY)
Admission: EM | Admit: 2014-04-03 | Discharge: 2014-04-03 | Disposition: A | Discharge status: Home / Self Care | Payer: Self-pay | Attending: Emergency Medicine | Admitting: Emergency Medicine

## 2014-04-03 ENCOUNTER — Encounter (HOSPITAL_COMMUNITY): Payer: Self-pay | Admitting: Adult Health

## 2014-04-03 DIAGNOSIS — R112 Nausea with vomiting, unspecified: Secondary | ICD-10-CM | POA: Insufficient documentation

## 2014-04-03 DIAGNOSIS — R51 Headache: Secondary | ICD-10-CM

## 2014-04-03 DIAGNOSIS — B349 Viral infection, unspecified: Secondary | ICD-10-CM

## 2014-04-03 DIAGNOSIS — R05 Cough: Secondary | ICD-10-CM | POA: Insufficient documentation

## 2014-04-03 DIAGNOSIS — R519 Headache, unspecified: Secondary | ICD-10-CM

## 2014-04-03 DIAGNOSIS — Z720 Tobacco use: Secondary | ICD-10-CM | POA: Insufficient documentation

## 2014-04-03 DIAGNOSIS — R531 Weakness: Secondary | ICD-10-CM | POA: Insufficient documentation

## 2014-04-03 DIAGNOSIS — Z79899 Other long term (current) drug therapy: Secondary | ICD-10-CM | POA: Insufficient documentation

## 2014-04-03 LAB — COMPREHENSIVE METABOLIC PANEL
ALK PHOS: 58 U/L (ref 39–117)
ALT: 11 U/L (ref 0–53)
AST: 23 U/L (ref 0–37)
Albumin: 4.1 g/dL (ref 3.5–5.2)
Anion gap: 14 (ref 5–15)
BUN: 10 mg/dL (ref 6–23)
CALCIUM: 9.9 mg/dL (ref 8.4–10.5)
CO2: 23 mEq/L (ref 19–32)
Chloride: 102 mEq/L (ref 96–112)
Creatinine, Ser: 0.79 mg/dL (ref 0.50–1.35)
GFR calc non Af Amer: >90 mL/min (ref >90)
GLUCOSE: 93 mg/dL (ref 70–99)
POTASSIUM: 4.2 meq/L (ref 3.7–5.3)
SODIUM: 139 meq/L (ref 137–147)
TOTAL PROTEIN: 7.9 g/dL (ref 6.0–8.3)
Total Bilirubin: 0.4 mg/dL (ref 0.3–1.2)

## 2014-04-03 LAB — CBC WITH DIFFERENTIAL/PLATELET
Basophils Absolute: 0 10*3/uL (ref 0.0–0.1)
Basophils Relative: 0 % (ref 0–1)
Eosinophils Absolute: 0 10*3/uL (ref 0.0–0.7)
Eosinophils Relative: 0 % (ref 0–5)
HCT: 41 % (ref 39.0–52.0)
Hemoglobin: 14.7 g/dL (ref 13.0–17.0)
LYMPHS ABS: 1 10*3/uL (ref 0.7–4.0)
Lymphocytes Relative: 13 % (ref 12–46)
MCH: 27.4 pg (ref 26.0–34.0)
MCHC: 35.9 g/dL (ref 30.0–36.0)
MCV: 76.4 fL — ABNORMAL LOW (ref 78.0–100.0)
Monocytes Absolute: 0.3 10*3/uL (ref 0.1–1.0)
Monocytes Relative: 4 % (ref 3–12)
NEUTROS PCT: 83 % — AB (ref 43–77)
Neutro Abs: 6.9 10*3/uL (ref 1.7–7.7)
PLATELETS: 281 10*3/uL (ref 150–400)
RBC: 5.37 MIL/uL (ref 4.22–5.81)
RDW: 14.1 % (ref 11.5–15.5)
WBC: 8.2 10*3/uL (ref 4.0–10.5)

## 2014-04-03 MED ORDER — DIPHENHYDRAMINE HCL 50 MG/ML IJ SOLN
25.0000 mg | Freq: Once | INTRAMUSCULAR | Status: AC
  Administered 2014-04-03: 25 mg via INTRAVENOUS
  Filled 2014-04-03: qty 1

## 2014-04-03 MED ORDER — SODIUM CHLORIDE 0.9 % IV BOLUS (SEPSIS)
1000.0000 mL | Freq: Once | INTRAVENOUS | Status: AC
  Administered 2014-04-03: 1000 mL via INTRAVENOUS

## 2014-04-03 MED ORDER — KETOROLAC TROMETHAMINE 30 MG/ML IJ SOLN
30.0000 mg | Freq: Once | INTRAMUSCULAR | Status: AC
  Administered 2014-04-03: 30 mg via INTRAVENOUS
  Filled 2014-04-03: qty 1

## 2014-04-03 MED ORDER — METOCLOPRAMIDE HCL 5 MG/ML IJ SOLN
10.0000 mg | Freq: Once | INTRAMUSCULAR | Status: AC
  Administered 2014-04-03: 10 mg via INTRAVENOUS
  Filled 2014-04-03: qty 2

## 2014-04-03 NOTE — ED Notes (Addendum)
Presents with 2 days of headache, nausea, vomiting, generalized body aches and weakness, feeling lightheaded-denies fevers endorses chills. Denies neck or  Back pain. Alert, oriented.  Emesis x4 today and diarrhea x3 yesterday

## 2014-04-03 NOTE — ED Provider Notes (Signed)
CSN: [id number]     Arrival date & time 04/03/14  1938 History   First MD Initiated Contact with Patient 04/03/14 2058     Chief Complaint  Patient presents with  . Headache  . Weakness  . Nausea     (Consider location/radiation/quality/duration/timing/severity/associated sxs/prior Treatment) Patient is a 28 y.o. male presenting with headaches and weakness. The history is provided by the patient.  Headache Pain location:  Frontal Quality:  Dull Radiates to:  Does not radiate Severity currently:  10/10 Severity at highest:  10/10 Onset quality:  Gradual Duration:  2 days Timing:  Constant Progression:  Unchanged Chronicity:  New Context comment:  Viral syndrome Relieved by:  Nothing Worsened by:  Nothing tried Ineffective treatments:  None tried Associated symptoms: cough, myalgias, nausea and vomiting   Associated symptoms: no abdominal pain, no diarrhea, no pain, no fever, no neck pain and no numbness   Cough:    Cough characteristics:  Productive   Sputum characteristics:  Nondescript Myalgias:    Location:  Generalized   Quality:  Aching   Severity:  Mild   Onset quality:  Gradual   Duration:  2 days   Timing:  Constant   Progression:  Unchanged Weakness Associated symptoms include headaches. Pertinent negatives include no chest pain and no abdominal pain.    Past Medical History  Diagnosis Date  . Asthma   . DVT (deep venous thrombosis)    Past Surgical History  Procedure Laterality Date  . Laparotomy N/A 09/13/2013    Procedure: EXPLORATORY LAPAROTOMY;  Surgeon: Gwenyth Ober, MD;  Location: [place];  Service: General;  Laterality: N/A;  . Cystoscopy N/A 09/13/2013    Procedure: CYSTOSCOPY and laceration repair;  Surgeon: Gwenyth Ober, MD;  Location: [place];  Service: General;  Laterality: N/A;   History reviewed. No pertinent family history. History  Substance Use Topics  . Smoking status: Current Every Day Smoker -- 0.50 packs/day    Types: Cigarettes   . Smokeless tobacco: Never Used  . Alcohol Use: No    Review of Systems  Constitutional: Negative for fever.  HENT: Negative for drooling and rhinorrhea.   Eyes: Negative for pain.  Respiratory: Positive for cough.   Cardiovascular: Negative for chest pain and leg swelling.  Gastrointestinal: Positive for nausea and vomiting. Negative for abdominal pain and diarrhea.  Genitourinary: Negative for dysuria and hematuria.  Musculoskeletal: Positive for myalgias. Negative for gait problem and neck pain.  Skin: Negative for color change.  Neurological: Positive for weakness and headaches. Negative for numbness.  Hematological: Negative for adenopathy.  Psychiatric/Behavioral: Negative for behavioral problems.  All other systems reviewed and are negative.     Allergies  Review of patient's allergies indicates no known allergies.  Home Medications   Prior to Admission medications   Medication Sig Start Date End Date Taking? Authorizing Provider  acetaminophen (TYLENOL) 500 MG tablet Take 1,000 mg by mouth every 6 (six) hours as needed for pain.   Yes Historical Provider, MD  albuterol (PROVENTIL HFA;VENTOLIN HFA) 108 (90 BASE) MCG/ACT inhaler Inhale 2 puffs into the lungs every 6 (six) hours as needed for wheezing.   Yes Historical Provider, MD  enalapril (VASOTEC) 5 MG tablet Take 5 mg by mouth daily.   Yes Historical Provider, MD  hydrochlorothiazide (HYDRODIURIL) 25 MG tablet Take 25 mg by mouth daily.   Yes Historical Provider, MD  rivaroxaban (XARELTO) 20 MG TABS tablet Take 20 mg by mouth daily with supper.  Historical Provider, MD   BP 133/72 mmHg  Pulse 68  Temp(Src) 98.1 F (36.7 C) (Oral)  Resp 20  Ht 5\' 7"  (1.702 m)  Wt 180 lb 13 oz (82.016 kg)  BMI 28.31 kg/m2  SpO2 99% Physical Exam  Constitutional: He is oriented to person, place, and time. He appears well-developed and well-nourished.  HENT:  Head: Normocephalic and atraumatic.  Right Ear: External ear  normal.  Left Ear: External ear normal.  Nose: Nose normal.  Mouth/Throat: Oropharynx is clear and moist. No oropharyngeal exudate.  Eyes: Conjunctivae and EOM are normal. Pupils are equal, round, and reactive to light.  Neck: Normal range of motion. Neck supple.  Cardiovascular: Normal rate, regular rhythm, normal heart sounds and intact distal pulses.  Exam reveals no gallop and no friction rub.   No murmur heard. Pulmonary/Chest: Effort normal and breath sounds normal. No respiratory distress. He has no wheezes.  Abdominal: Soft. Bowel sounds are normal. He exhibits no distension. There is no tenderness. There is no rebound and no guarding.  Musculoskeletal: Normal range of motion. He exhibits no edema or tenderness.  Neurological: He is alert and oriented to person, place, and time.  alert, oriented x3 speech: normal in context and clarity memory: intact grossly cranial nerves II-XII: intact motor strength: full proximally and distally no involuntary movements or tremors sensation: intact to light touch diffusely  cerebellar: finger-to-nose intact gait: normal gait  Skin: Skin is warm and dry.  Psychiatric: He has a normal mood and affect. His behavior is normal.  Nursing note and vitals reviewed.   ED Course  Procedures (including critical care time) Labs Review Labs Reviewed  CBC WITH DIFFERENTIAL - Abnormal; Notable for the following:    MCV 76.4 (*)    Neutrophils Relative % 83 (*)    All other components within normal limits  COMPREHENSIVE METABOLIC PANEL    Imaging Review No results found.   EKG Interpretation None      MDM   Final diagnoses:  Viral syndrome  Headache, unspecified headache type    9:18 PM 28 y.o. male w hx of asthma, DVT on xarelto (has not taken in 2 mos d/t financial reasons) who presents with nausea, vomiting, body aches, generalized weakness, headache, cough for 2 days. He denies any fevers. Headache is frontal and gradual in onset,  currently 10 out of 10. He is afebrile and vital signs are unremarkable here. He has a normal neurologic exam. Likely a viral syndrome. Screening lab work sent prior to my evaluation. It is noncontributory. Will treat headache with a headache cocktail.  I had care mgmt see the pt and they gave him a coupon for another month of Xarelto. They reinforced that he has to follow-up with his PCP and fill out more paperwork if he is to continue on the Xarelto.  10:50 PM: HA improved. Pt continues to appear well. Likely viral syndrome.  I have discussed the diagnosis/risks/treatment options with the patient and believe the pt to be eligible for discharge home to follow-up with his pcp. We also discussed returning to the ED immediately if new or worsening sx occur. We discussed the sx which are most concerning (e.g., worsening HA, fever, sob) that necessitate immediate return. Medications administered to the patient during their visit and any new prescriptions provided to the patient are listed below.  Medications given during this visit Medications  sodium chloride 0.9 % bolus 1,000 mL (0 mLs Intravenous Stopped 04/03/14 2239)  diphenhydrAMINE (BENADRYL) injection 25  mg (25 mg Intravenous Given 04/03/14 2126)  ketorolac (TORADOL) 30 MG/ML injection 30 mg (30 mg Intravenous Given 04/03/14 2126)  metoCLOPramide (REGLAN) injection 10 mg (10 mg Intravenous Given 04/03/14 2126)    New Prescriptions   No medications on file     Pamella Pert, MD 04/03/14 2250

## 2014-04-03 NOTE — ED Notes (Signed)
xarelto paper given to pt that was given by caseworker who explained procedure to pt pt verbalized to writer he understood what he needed to do regarding meduication

## 2014-04-03 NOTE — ED Notes (Signed)
Mariann Laster, case worker at bedside.

## 2014-04-03 NOTE — Progress Notes (Signed)
ED CM received call from ED Secretary Kim concerning medication assistance for xarelto. Patient presented to [facility] ED with c/o headaches, cough, and myalgias. PMH DVT was on xarelto but have been off for approx 2 months. Patient states he has not been able to afford the medication. Patient is uninsured. Reviewed record, and met with patient at bedside.  Provided patient with xarelto enrollment savings card via [employer] website.  Stressed the importance that patient follows up with PCP in a week so that patient can get assistance with subsequent doses of medication. Patient verbalized understanding. Patient instructed to take printed enrollment card to pharmacy along with prescription with xarelto to redeem, Patient verbalized understanding. Discussed disposition plan with Dr. Aline Brochure and Sharyn Lull RN both are agreeable. Reiterated the importance of f/u/. No furthe ED CM needs identified noted.

## 2014-04-11 ENCOUNTER — Inpatient Hospital Stay (HOSPITAL_COMMUNITY): Payer: Self-pay | Admitting: Certified Registered"

## 2014-04-11 ENCOUNTER — Inpatient Hospital Stay (HOSPITAL_COMMUNITY)
Admission: EM | Admit: 2014-04-11 | Discharge: 2014-05-13 | DRG: 957 | Disposition: A | Discharge status: Home Health | Payer: Self-pay | Attending: General Surgery | Admitting: General Surgery

## 2014-04-11 ENCOUNTER — Inpatient Hospital Stay (HOSPITAL_COMMUNITY): Payer: Self-pay

## 2014-04-11 ENCOUNTER — Encounter (HOSPITAL_COMMUNITY): Admission: EM | Disposition: A | Discharge status: Home Health | Payer: Self-pay | Source: Home / Self Care

## 2014-04-11 ENCOUNTER — Encounter (HOSPITAL_COMMUNITY): Payer: Self-pay | Admitting: Emergency Medicine

## 2014-04-11 DIAGNOSIS — T1490XA Injury, unspecified, initial encounter: Secondary | ICD-10-CM

## 2014-04-11 DIAGNOSIS — S3710XA Unspecified injury of ureter, initial encounter: Secondary | ICD-10-CM | POA: Diagnosis present

## 2014-04-11 DIAGNOSIS — J9811 Atelectasis: Secondary | ICD-10-CM | POA: Diagnosis not present

## 2014-04-11 DIAGNOSIS — S31109A Unspecified open wound of abdominal wall, unspecified quadrant without penetration into peritoneal cavity, initial encounter: Secondary | ICD-10-CM

## 2014-04-11 DIAGNOSIS — Y249XXA Unspecified firearm discharge, undetermined intent, initial encounter: Secondary | ICD-10-CM

## 2014-04-11 DIAGNOSIS — S3282XA Multiple fractures of pelvis without disruption of pelvic ring, initial encounter for closed fracture: Secondary | ICD-10-CM | POA: Diagnosis present

## 2014-04-11 DIAGNOSIS — S62306A Unspecified fracture of fifth metacarpal bone, right hand, initial encounter for closed fracture: Secondary | ICD-10-CM | POA: Diagnosis present

## 2014-04-11 DIAGNOSIS — S71101A Unspecified open wound, right thigh, initial encounter: Secondary | ICD-10-CM | POA: Diagnosis present

## 2014-04-11 DIAGNOSIS — N399 Disorder of urinary system, unspecified: Secondary | ICD-10-CM | POA: Insufficient documentation

## 2014-04-11 DIAGNOSIS — R571 Hypovolemic shock: Secondary | ICD-10-CM | POA: Diagnosis present

## 2014-04-11 DIAGNOSIS — S36592A Other injury of descending [left] colon, initial encounter: Secondary | ICD-10-CM | POA: Diagnosis present

## 2014-04-11 DIAGNOSIS — S31104A Unspecified open wound of abdominal wall, left lower quadrant without penetration into peritoneal cavity, initial encounter: Secondary | ICD-10-CM | POA: Diagnosis present

## 2014-04-11 DIAGNOSIS — I82411 Acute embolism and thrombosis of right femoral vein: Secondary | ICD-10-CM | POA: Diagnosis not present

## 2014-04-11 DIAGNOSIS — S31021A Laceration with foreign body of lower back and pelvis with penetration into retroperitoneum, initial encounter: Secondary | ICD-10-CM | POA: Diagnosis present

## 2014-04-11 DIAGNOSIS — Z01818 Encounter for other preprocedural examination: Secondary | ICD-10-CM

## 2014-04-11 DIAGNOSIS — J9600 Acute respiratory failure, unspecified whether with hypoxia or hypercapnia: Secondary | ICD-10-CM | POA: Diagnosis present

## 2014-04-11 DIAGNOSIS — I728 Aneurysm of other specified arteries: Secondary | ICD-10-CM | POA: Diagnosis present

## 2014-04-11 DIAGNOSIS — R402123 Coma scale, eyes open, to pain, at hospital admission: Secondary | ICD-10-CM | POA: Diagnosis present

## 2014-04-11 DIAGNOSIS — E871 Hypo-osmolality and hyponatremia: Secondary | ICD-10-CM | POA: Diagnosis not present

## 2014-04-11 DIAGNOSIS — S36509A Unspecified injury of unspecified part of colon, initial encounter: Secondary | ICD-10-CM | POA: Diagnosis present

## 2014-04-11 DIAGNOSIS — N320 Bladder-neck obstruction: Secondary | ICD-10-CM | POA: Diagnosis present

## 2014-04-11 DIAGNOSIS — W3400XA Accidental discharge from unspecified firearms or gun, initial encounter: Secondary | ICD-10-CM

## 2014-04-11 DIAGNOSIS — S37001A Unspecified injury of right kidney, initial encounter: Secondary | ICD-10-CM

## 2014-04-11 DIAGNOSIS — S36893A Laceration of other intra-abdominal organs, initial encounter: Secondary | ICD-10-CM | POA: Diagnosis present

## 2014-04-11 DIAGNOSIS — S32810B Multiple fractures of pelvis with stable disruption of pelvic ring, initial encounter for open fracture: Principal | ICD-10-CM | POA: Diagnosis present

## 2014-04-11 DIAGNOSIS — S3681XA Injury of peritoneum, initial encounter: Secondary | ICD-10-CM | POA: Diagnosis present

## 2014-04-11 DIAGNOSIS — I739 Peripheral vascular disease, unspecified: Secondary | ICD-10-CM | POA: Diagnosis present

## 2014-04-11 DIAGNOSIS — R402343 Coma scale, best motor response, flexion withdrawal, at hospital admission: Secondary | ICD-10-CM | POA: Diagnosis present

## 2014-04-11 DIAGNOSIS — R402223 Coma scale, best verbal response, incomprehensible words, at hospital admission: Secondary | ICD-10-CM | POA: Diagnosis present

## 2014-04-11 DIAGNOSIS — I82419 Acute embolism and thrombosis of unspecified femoral vein: Secondary | ICD-10-CM | POA: Diagnosis not present

## 2014-04-11 DIAGNOSIS — J45909 Unspecified asthma, uncomplicated: Secondary | ICD-10-CM | POA: Diagnosis present

## 2014-04-11 DIAGNOSIS — J9690 Respiratory failure, unspecified, unspecified whether with hypoxia or hypercapnia: Secondary | ICD-10-CM | POA: Diagnosis present

## 2014-04-11 DIAGNOSIS — S3723XA Laceration of bladder, initial encounter: Secondary | ICD-10-CM | POA: Diagnosis present

## 2014-04-11 DIAGNOSIS — S35512A Injury of left iliac artery, initial encounter: Secondary | ICD-10-CM | POA: Diagnosis present

## 2014-04-11 DIAGNOSIS — N368 Other specified disorders of urethra: Secondary | ICD-10-CM | POA: Diagnosis present

## 2014-04-11 DIAGNOSIS — D62 Acute posthemorrhagic anemia: Secondary | ICD-10-CM | POA: Diagnosis not present

## 2014-04-11 DIAGNOSIS — S3713XA Laceration of ureter, initial encounter: Secondary | ICD-10-CM | POA: Diagnosis present

## 2014-04-11 DIAGNOSIS — S37823A Laceration of prostate, initial encounter: Secondary | ICD-10-CM | POA: Diagnosis present

## 2014-04-11 DIAGNOSIS — I959 Hypotension, unspecified: Secondary | ICD-10-CM | POA: Diagnosis present

## 2014-04-11 DIAGNOSIS — S62306B Unspecified fracture of fifth metacarpal bone, right hand, initial encounter for open fracture: Secondary | ICD-10-CM | POA: Diagnosis present

## 2014-04-11 DIAGNOSIS — K567 Ileus, unspecified: Secondary | ICD-10-CM | POA: Diagnosis not present

## 2014-04-11 DIAGNOSIS — S37002A Unspecified injury of left kidney, initial encounter: Secondary | ICD-10-CM

## 2014-04-11 DIAGNOSIS — M25551 Pain in right hip: Secondary | ICD-10-CM

## 2014-04-11 DIAGNOSIS — I97418 Intraoperative hemorrhage and hematoma of a circulatory system organ or structure complicating other circulatory system procedure: Secondary | ICD-10-CM | POA: Diagnosis present

## 2014-04-11 DIAGNOSIS — N9989 Other postprocedural complications and disorders of genitourinary system: Secondary | ICD-10-CM

## 2014-04-11 DIAGNOSIS — L02214 Cutaneous abscess of groin: Secondary | ICD-10-CM | POA: Diagnosis not present

## 2014-04-11 DIAGNOSIS — S31001A Unspecified open wound of lower back and pelvis with penetration into retroperitoneum, initial encounter: Secondary | ICD-10-CM | POA: Diagnosis present

## 2014-04-11 DIAGNOSIS — T794XXA Traumatic shock, initial encounter: Secondary | ICD-10-CM | POA: Diagnosis present

## 2014-04-11 DIAGNOSIS — IMO0002 Reserved for concepts with insufficient information to code with codable children: Secondary | ICD-10-CM

## 2014-04-11 DIAGNOSIS — S62309B Unspecified fracture of unspecified metacarpal bone, initial encounter for open fracture: Secondary | ICD-10-CM

## 2014-04-11 DIAGNOSIS — S3720XA Unspecified injury of bladder, initial encounter: Secondary | ICD-10-CM | POA: Diagnosis present

## 2014-04-11 DIAGNOSIS — S36532A Laceration of descending [left] colon, initial encounter: Secondary | ICD-10-CM | POA: Diagnosis present

## 2014-04-11 DIAGNOSIS — R109 Unspecified abdominal pain: Secondary | ICD-10-CM

## 2014-04-11 DIAGNOSIS — M62838 Other muscle spasm: Secondary | ICD-10-CM | POA: Diagnosis not present

## 2014-04-11 HISTORY — DX: Accidental discharge from unspecified firearms or gun, initial encounter: W34.00XA

## 2014-04-11 HISTORY — PX: LAPAROTOMY: SHX154

## 2014-04-11 HISTORY — PX: INSERTION OF SUPRAPUBIC CATHETER: SHX5870

## 2014-04-11 LAB — COMPREHENSIVE METABOLIC PANEL
ALBUMIN: 3.3 g/dL — AB (ref 3.5–5.2)
ALT: 11 U/L (ref 0–53)
AST: 25 U/L (ref 0–37)
Alkaline Phosphatase: 50 U/L (ref 39–117)
Anion gap: 29 — ABNORMAL HIGH (ref 5–15)
BUN: 14 mg/dL (ref 6–23)
CO2: 13 meq/L — AB (ref 19–32)
Calcium: 8.1 mg/dL — ABNORMAL LOW (ref 8.4–10.5)
Chloride: 100 mEq/L (ref 96–112)
Creatinine, Ser: 2.54 mg/dL — ABNORMAL HIGH (ref 0.50–1.35)
GLUCOSE: 123 mg/dL — AB (ref 70–99)
Potassium: 4.5 mEq/L (ref 3.7–5.3)
SODIUM: 142 meq/L (ref 137–147)
TOTAL PROTEIN: 6.3 g/dL (ref 6.0–8.3)
Total Bilirubin: 0.5 mg/dL (ref 0.3–1.2)

## 2014-04-11 LAB — CK TOTAL AND CKMB (NOT AT ARMC)
CK TOTAL: 492 U/L — AB (ref 7–232)
CK, MB: 3.9 ng/mL (ref 0.3–4.0)
Relative Index: 0.8 (ref 0.0–2.5)

## 2014-04-11 LAB — POCT I-STAT, CHEM 8
BUN: 16 mg/dL (ref 6–23)
CHLORIDE: 105 meq/L (ref 96–112)
CREATININE: 2.7 mg/dL — AB (ref 0.50–1.35)
Calcium, Ion: 1.04 mmol/L — ABNORMAL LOW (ref 1.12–1.23)
Glucose, Bld: 125 mg/dL — ABNORMAL HIGH (ref 70–99)
HCT: 38 % — ABNORMAL LOW (ref 39.0–52.0)
HEMOGLOBIN: 12.9 g/dL — AB (ref 13.0–17.0)
POTASSIUM: 4.1 meq/L (ref 3.7–5.3)
Sodium: 140 mEq/L (ref 137–147)
TCO2: 13 mmol/L (ref 0–100)

## 2014-04-11 LAB — PROTIME-INR
INR: 1.23 (ref 0.00–1.49)
Prothrombin Time: 15.6 seconds — ABNORMAL HIGH (ref 11.6–15.2)

## 2014-04-11 LAB — CBC WITH DIFFERENTIAL/PLATELET
BASOS ABS: 0 10*3/uL (ref 0.0–0.1)
Basophils Relative: 0 % (ref 0–1)
EOS ABS: 0 10*3/uL (ref 0.0–0.7)
Eosinophils Relative: 0 % (ref 0–5)
HCT: 34.4 % — ABNORMAL LOW (ref 39.0–52.0)
Hemoglobin: 11.8 g/dL — ABNORMAL LOW (ref 13.0–17.0)
LYMPHS ABS: 1.7 10*3/uL (ref 0.7–4.0)
Lymphocytes Relative: 26 % (ref 12–46)
MCH: 28 pg (ref 26.0–34.0)
MCHC: 34.3 g/dL (ref 30.0–36.0)
MCV: 81.5 fL (ref 78.0–100.0)
MONOS PCT: 4 % (ref 3–12)
Monocytes Absolute: 0.3 10*3/uL (ref 0.1–1.0)
NEUTROS ABS: 4.4 10*3/uL (ref 1.7–7.7)
NEUTROS PCT: 70 % (ref 43–77)
Platelets: 225 10*3/uL (ref 150–400)
RBC: 4.22 MIL/uL (ref 4.22–5.81)
RDW: 14.5 % (ref 11.5–15.5)
WBC: 6.4 10*3/uL (ref 4.0–10.5)

## 2014-04-11 LAB — CDS SEROLOGY

## 2014-04-11 LAB — MASSIVE TRANSFUSION PROTOCOL ORDER (BLOOD BANK NOTIFICATION)

## 2014-04-11 LAB — ETHANOL

## 2014-04-11 LAB — POCT I-STAT TROPONIN I: TROPONIN I, POC: 0.01 ng/mL (ref 0.00–0.08)

## 2014-04-11 LAB — I-STAT CG4 LACTIC ACID, ED: Lactic Acid, Venous: 10.8 mmol/L — ABNORMAL HIGH (ref 0.5–2.2)

## 2014-04-11 SURGERY — LAPAROTOMY, EXPLORATORY
Anesthesia: General | Site: Abdomen

## 2014-04-11 MED ORDER — CEFAZOLIN SODIUM-DEXTROSE 2-3 GM-% IV SOLR
INTRAVENOUS | Status: DC | PRN
  Administered 2014-04-11: 2 g via INTRAVENOUS

## 2014-04-11 MED ORDER — SODIUM BICARBONATE 8.4 % IV SOLN
INTRAVENOUS | Status: DC | PRN
  Administered 2014-04-11: 50 meq via INTRAVENOUS

## 2014-04-11 MED ORDER — FENTANYL CITRATE 0.05 MG/ML IJ SOLN
INTRAMUSCULAR | Status: DC | PRN
  Administered 2014-04-11 (×2): 100 ug via INTRAVENOUS
  Administered 2014-04-11: 50 ug via INTRAVENOUS

## 2014-04-11 MED ORDER — MIDAZOLAM HCL 2 MG/2ML IJ SOLN
INTRAMUSCULAR | Status: AC
  Filled 2014-04-11: qty 2

## 2014-04-11 MED ORDER — PROPOFOL INFUSION 10 MG/ML OPTIME
INTRAVENOUS | Status: DC | PRN
  Administered 2014-04-11: 120 ug/kg/min via INTRAVENOUS

## 2014-04-11 MED ORDER — CALCIUM CHLORIDE 10 % IV SOLN
INTRAVENOUS | Status: DC | PRN
  Administered 2014-04-11: 500 mg via INTRAVENOUS
  Administered 2014-04-11: 1000 mg via INTRAVENOUS
  Administered 2014-04-11: 500 mg via INTRAVENOUS

## 2014-04-11 MED ORDER — METHYLENE BLUE 1 % INJ SOLN
INTRAMUSCULAR | Status: AC
  Filled 2014-04-11: qty 10

## 2014-04-11 MED ORDER — SODIUM CHLORIDE 0.9 % IV SOLN
INTRAVENOUS | Status: DC | PRN
  Administered 2014-04-11: 20:00:00 via INTRAVENOUS

## 2014-04-11 MED ORDER — 0.9 % SODIUM CHLORIDE (POUR BTL) OPTIME
TOPICAL | Status: DC | PRN
  Administered 2014-04-11: 3000 mL

## 2014-04-11 MED ORDER — IOHEXOL 300 MG/ML  SOLN
150.0000 mL | Freq: Once | INTRAMUSCULAR | Status: AC | PRN
  Administered 2014-04-11: 100 mL via INTRAVENOUS

## 2014-04-11 MED ORDER — SODIUM CHLORIDE 0.9 % IV SOLN
INTRAVENOUS | Status: AC | PRN
  Administered 2014-04-11: 1000 mL via INTRAVENOUS
  Administered 2014-04-12: 06:00:00 via INTRAVENOUS

## 2014-04-11 MED ORDER — GLYCOPYRROLATE 0.2 MG/ML IJ SOLN
INTRAMUSCULAR | Status: DC | PRN
  Administered 2014-04-11: 0.4 mg via INTRAVENOUS

## 2014-04-11 MED ORDER — PHENYLEPHRINE HCL 10 MG/ML IJ SOLN
INTRAMUSCULAR | Status: DC | PRN
  Administered 2014-04-11 (×3): 80 ug via INTRAVENOUS
  Administered 2014-04-11: 200 ug via INTRAVENOUS
  Administered 2014-04-11: 80 ug via INTRAVENOUS
  Administered 2014-04-11: 200 ug via INTRAVENOUS
  Administered 2014-04-11: 400 ug via INTRAVENOUS

## 2014-04-11 MED ORDER — LACTATED RINGERS IV SOLN
INTRAVENOUS | Status: DC | PRN
  Administered 2014-04-11 (×3): via INTRAVENOUS

## 2014-04-11 MED ORDER — MIDAZOLAM HCL 2 MG/2ML IJ SOLN
INTRAMUSCULAR | Status: DC | PRN
  Administered 2014-04-11: 2 mg via INTRAVENOUS

## 2014-04-11 MED ORDER — FENTANYL CITRATE 0.05 MG/ML IJ SOLN
INTRAMUSCULAR | Status: AC
  Filled 2014-04-11: qty 5

## 2014-04-11 MED ORDER — EPHEDRINE SULFATE 50 MG/ML IJ SOLN
INTRAMUSCULAR | Status: DC | PRN
  Administered 2014-04-11 (×2): 15 mg via INTRAVENOUS

## 2014-04-11 MED ORDER — PROPOFOL 10 MG/ML IV EMUL
INTRAVENOUS | Status: AC
  Administered 2014-04-12: 62 ug/kg/min via INTRAVENOUS
  Filled 2014-04-11: qty 100

## 2014-04-11 MED ORDER — ALBUMIN HUMAN 5 % IV SOLN
INTRAVENOUS | Status: DC | PRN
  Administered 2014-04-11: 20:00:00 via INTRAVENOUS

## 2014-04-11 MED ORDER — SUCCINYLCHOLINE CHLORIDE 20 MG/ML IJ SOLN
INTRAMUSCULAR | Status: AC | PRN
  Administered 2014-04-11: 100 mg via INTRAVENOUS

## 2014-04-11 MED ORDER — ROCURONIUM BROMIDE 100 MG/10ML IV SOLN
INTRAVENOUS | Status: DC | PRN
  Administered 2014-04-11 (×3): 50 mg via INTRAVENOUS

## 2014-04-11 MED ORDER — HEMOSTATIC AGENTS (NO CHARGE) OPTIME
TOPICAL | Status: DC | PRN
  Administered 2014-04-11: 3 via TOPICAL

## 2014-04-11 MED ORDER — ETOMIDATE 2 MG/ML IV SOLN
INTRAVENOUS | Status: AC | PRN
  Administered 2014-04-11: 20 mg via INTRAVENOUS

## 2014-04-11 SURGICAL SUPPLY — 63 items
BLADE SURG ROTATE 9660 (MISCELLANEOUS) ×2 IMPLANT
BNDG GAUZE ELAST 4 BULKY (GAUZE/BANDAGES/DRESSINGS) ×2 IMPLANT
CANISTER SUCTION 2500CC (MISCELLANEOUS) ×3 IMPLANT
CANISTER WOUND CARE 500ML ATS (WOUND CARE) ×4 IMPLANT
CATH AINSWORTH 30CC 24FR (CATHETERS) ×2 IMPLANT
CATH FOLEY 3WAY 30CC 22FR (CATHETERS) ×2 IMPLANT
CHLORAPREP W/TINT 26ML (MISCELLANEOUS) ×1 IMPLANT
COVER MAYO STAND STRL (DRAPES) IMPLANT
COVER SURGICAL LIGHT HANDLE (MISCELLANEOUS) ×3 IMPLANT
DRAPE INCISE IOBAN 66X45 STRL (DRAPES) ×2 IMPLANT
DRAPE LAPAROSCOPIC ABDOMINAL (DRAPES) ×3 IMPLANT
DRAPE PROXIMA HALF (DRAPES) IMPLANT
DRAPE UTILITY XL STRL (DRAPES) ×2 IMPLANT
DRAPE WARM FLUID 44X44 (DRAPE) ×3 IMPLANT
DRSG OPSITE POSTOP 4X10 (GAUZE/BANDAGES/DRESSINGS) IMPLANT
DRSG OPSITE POSTOP 4X8 (GAUZE/BANDAGES/DRESSINGS) IMPLANT
DURAPREP 26ML APPLICATOR (WOUND CARE) ×2 IMPLANT
ELECT BLADE 6.5 EXT (BLADE) ×2 IMPLANT
ELECT CAUTERY BLADE 6.4 (BLADE) ×4 IMPLANT
ELECT REM PT RETURN 9FT ADLT (ELECTROSURGICAL) ×3
ELECTRODE REM PT RTRN 9FT ADLT (ELECTROSURGICAL) ×1 IMPLANT
GAUZE SPONGE 4X4 12PLY STRL (GAUZE/BANDAGES/DRESSINGS) ×4 IMPLANT
GLOVE BIO SURGEON STRL SZ 6.5 (GLOVE) ×3 IMPLANT
GLOVE BIO SURGEON STRL SZ7 (GLOVE) ×5 IMPLANT
GLOVE BIO SURGEON STRL SZ7.5 (GLOVE) ×2 IMPLANT
GLOVE BIO SURGEONS STRL SZ 6.5 (GLOVE) ×3
GLOVE BIOGEL PI IND STRL 6.5 (GLOVE) IMPLANT
GLOVE BIOGEL PI IND STRL 7.5 (GLOVE) ×1 IMPLANT
GLOVE BIOGEL PI INDICATOR 6.5 (GLOVE) ×2
GLOVE BIOGEL PI INDICATOR 7.5 (GLOVE) ×4
GOWN STRL REUS W/ TWL LRG LVL3 (GOWN DISPOSABLE) ×3 IMPLANT
GOWN STRL REUS W/TWL LRG LVL3 (GOWN DISPOSABLE) ×6
HEMOSTAT SURGICEL 2X14 (HEMOSTASIS) ×6 IMPLANT
KIT BASIN OR (CUSTOM PROCEDURE TRAY) ×3 IMPLANT
KIT ROOM TURNOVER OR (KITS) ×3 IMPLANT
LIGASURE IMPACT 36 18CM CVD LR (INSTRUMENTS) ×2 IMPLANT
NS IRRIG 1000ML POUR BTL (IV SOLUTION) ×8 IMPLANT
PACK GENERAL/GYN (CUSTOM PROCEDURE TRAY) ×3 IMPLANT
PAD ARMBOARD 7.5X6 YLW CONV (MISCELLANEOUS) ×3 IMPLANT
PENCIL BUTTON HOLSTER BLD 10FT (ELECTRODE) IMPLANT
SPECIMEN JAR LARGE (MISCELLANEOUS) IMPLANT
SPONGE ABDOMINAL VAC ABTHERA (MISCELLANEOUS) ×2 IMPLANT
SPONGE GAUZE 4X4 12PLY STER LF (GAUZE/BANDAGES/DRESSINGS) ×2 IMPLANT
SPONGE LAP 18X18 X RAY DECT (DISPOSABLE) ×14 IMPLANT
STAPLER VISISTAT 35W (STAPLE) ×3 IMPLANT
SUCTION POOLE TIP (SUCTIONS) ×3 IMPLANT
SUT PDS AB 1 TP1 96 (SUTURE) ×2 IMPLANT
SUT SILK 2 0 (SUTURE) ×6
SUT SILK 2 0 SH CR/8 (SUTURE) ×3 IMPLANT
SUT SILK 2-0 18XBRD TIE 12 (SUTURE) ×1 IMPLANT
SUT SILK 3 0 (SUTURE) ×6
SUT SILK 3 0 SH CR/8 (SUTURE) ×3 IMPLANT
SUT SILK 3-0 18XBRD TIE 12 (SUTURE) ×1 IMPLANT
SUT VIC AB 3-0 SH 27 (SUTURE)
SUT VIC AB 3-0 SH 27X BRD (SUTURE) IMPLANT
TAPE CLOTH SURG 4X10 WHT LF (GAUZE/BANDAGES/DRESSINGS) ×2 IMPLANT
TOWEL OR 17X26 10 PK STRL BLUE (TOWEL DISPOSABLE) ×3 IMPLANT
TRAY FOLEY CATH 16FRSI W/METER (SET/KITS/TRAYS/PACK) ×2 IMPLANT
TRAY FOLEY IC TEMP SENS 14FR (CATHETERS) ×2 IMPLANT
TUBE CONNECTING 12'X1/4 (SUCTIONS) ×1
TUBE CONNECTING 12X1/4 (SUCTIONS) ×1 IMPLANT
WATER STERILE IRR 1000ML POUR (IV SOLUTION) ×2 IMPLANT
YANKAUER SUCT BULB TIP NO VENT (SUCTIONS) ×2 IMPLANT

## 2014-04-11 NOTE — Procedures (Signed)
Central Venous Catheter Insertion Procedure Note Carlos Lawrence [id number] 1986-02-12  Procedure: Insertion of Central Venous Catheter Indications: resuscitation  Procedure Details Consent: Unable to obtain consent because of emergent medical necessity. Time Out: Verified patient identification, verified procedure, site/side was marked, verified correct patient position, special equipment/implants available, medications/allergies/relevent history reviewed, required imaging and test results available.   Skin prep: Chlorhexidine; local anesthetic administered A antimicrobial bonded/coated single lumen catheter was placed in the left femoral vein due to emergent situation using the Seldinger technique.  Evaluation Blood flow good Complications: No apparent complications Patient did tolerate procedure well.  Caitrin Pendergraph 04/11/2014, 11:46 PM

## 2014-04-11 NOTE — Procedures (Signed)
Intubation Procedure Note Carlos Lawrence [id number] 04/25/1874  Procedure: Intubation Indications: Airway protection and maintenance  Procedure Details Consent: Risks of procedure as well as the alternatives and risks of each were explained to the (patient/caregiver).  Consent for procedure obtained. and Unable to obtain consent because of emergent medical necessity. Time Out: Verified patient identification, verified procedure, site/side was marked, verified correct patient position, special equipment/implants available, medications/allergies/relevent history reviewed, required imaging and test results available.  Performed  Maximum sterile technique was used including antiseptics, cap, gloves, gown, hand hygiene, mask and sheet.  MAC and 3    Evaluation Hemodynamic Status: BP stable throughout; O2 sats: stable throughout Patient's Current Condition: stable Complications: No apparent complications Patient did tolerate procedure well. Chest X-ray ordered to verify placement.  CXR: tube position acceptable.   Chriss Driver [facility] 04/11/2014
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
## 2014-04-11 NOTE — Progress Notes (Signed)
Chaplain responded to level 1 trauma page. Pt taken to surgery. No family present and pt name/info "restricted." Page if needed.   Vanetta Mulders 04/11/2014 7:51 PM
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
## 2014-04-11 NOTE — OR Nursing (Addendum)
ATTEMPTED FOLEY IN THE OPERATING ROOM WITH DR. Donne Hazel AT THE BEDSIDE. NO URINE RETURN AFTER PRESSING DOWN ON THE LOWER ABDOMEN OR BLOWING UP THE BALLOON. FOLEY BALLOON WAS DEFLATED AND FOLEY WAS REMOVED PER DR. WAKEFIELD PRIOR TO THE START OF SURGERY.

## 2014-04-11 NOTE — Progress Notes (Signed)
TAKEN TO INTERVENTIONAL RADIOLOGY BY Virgel Gess, CRNA AND S. PERKINS, RN.
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
## 2014-04-12 ENCOUNTER — Inpatient Hospital Stay (HOSPITAL_COMMUNITY): Payer: Self-pay

## 2014-04-12 ENCOUNTER — Inpatient Hospital Stay (HOSPITAL_COMMUNITY): Payer: Self-pay | Admitting: Anesthesiology

## 2014-04-12 ENCOUNTER — Encounter (HOSPITAL_COMMUNITY): Admission: EM | Disposition: A | Discharge status: Home Health | Payer: Self-pay | Source: Home / Self Care

## 2014-04-12 ENCOUNTER — Encounter (HOSPITAL_COMMUNITY): Payer: Self-pay | Admitting: Anesthesiology

## 2014-04-12 DIAGNOSIS — W3400XA Accidental discharge from unspecified firearms or gun, initial encounter: Secondary | ICD-10-CM

## 2014-04-12 DIAGNOSIS — I97418 Intraoperative hemorrhage and hematoma of a circulatory system organ or structure complicating other circulatory system procedure: Secondary | ICD-10-CM | POA: Diagnosis present

## 2014-04-12 HISTORY — PX: LAPAROTOMY: SHX154

## 2014-04-12 LAB — CBC
HCT: 24 % — ABNORMAL LOW (ref 39.0–52.0)
HCT: 30.4 % — ABNORMAL LOW (ref 39.0–52.0)
HCT: 29.2 % — ABNORMAL LOW (ref 39.0–52.0)
HCT: 29.2 % — ABNORMAL LOW (ref 39.0–52.0)
HEMATOCRIT: 22.8 % — AB (ref 39.0–52.0)
HEMOGLOBIN: 8 g/dL — AB (ref 13.0–17.0)
HEMOGLOBIN: 10.3 g/dL — AB (ref 13.0–17.0)
HEMOGLOBIN: 10.6 g/dL — AB (ref 13.0–17.0)
Hemoglobin: 8.6 g/dL — ABNORMAL LOW (ref 13.0–17.0)
Hemoglobin: 10.5 g/dL — ABNORMAL LOW (ref 13.0–17.0)
MCH: 28.2 pg (ref 26.0–34.0)
MCH: 28.6 pg (ref 26.0–34.0)
MCH: 28.6 pg (ref 26.0–34.0)
MCH: 29.3 pg (ref 26.0–34.0)
MCH: 30.2 pg (ref 26.0–34.0)
MCHC: 35.8 g/dL (ref 30.0–36.0)
MCHC: 35.1 g/dL (ref 30.0–36.0)
MCHC: 34.5 g/dL (ref 30.0–36.0)
MCHC: 36.3 g/dL — AB (ref 30.0–36.0)
MCHC: 35.3 g/dL (ref 30.0–36.0)
MCV: 80 fL (ref 78.0–100.0)
MCV: 80.7 fL (ref 78.0–100.0)
MCV: 81.4 fL (ref 78.0–100.0)
MCV: 82.8 fL (ref 78.0–100.0)
MCV: 84.2 fL (ref 78.0–100.0)
PLATELETS: 45 10*3/uL — AB (ref 150–400)
PLATELETS: 54 10*3/uL — AB (ref 150–400)
Platelets: 111 10*3/uL — ABNORMAL LOW (ref 150–400)
Platelets: 152 10*3/uL (ref 150–400)
Platelets: 60 10*3/uL — ABNORMAL LOW (ref 150–400)
RBC: 2.85 MIL/uL — ABNORMAL LOW (ref 4.22–5.81)
RBC: 2.8 MIL/uL — ABNORMAL LOW (ref 4.22–5.81)
RBC: 3.67 MIL/uL — ABNORMAL LOW (ref 4.22–5.81)
RBC: 3.65 MIL/uL — ABNORMAL LOW (ref 4.22–5.81)
RBC: 3.62 MIL/uL — ABNORMAL LOW (ref 4.22–5.81)
RDW: 14 % (ref 11.5–15.5)
RDW: 14 % (ref 11.5–15.5)
RDW: 14.1 % (ref 11.5–15.5)
RDW: 14.2 % (ref 11.5–15.5)
RDW: 14.9 % (ref 11.5–15.5)
WBC: 3.3 10*3/uL — ABNORMAL LOW (ref 4.0–10.5)
WBC: 4.3 10*3/uL (ref 4.0–10.5)
WBC: 5.4 10*3/uL (ref 4.0–10.5)
WBC: 6.3 10*3/uL (ref 4.0–10.5)
WBC: 8.4 10*3/uL (ref 4.0–10.5)

## 2014-04-12 LAB — TYPE AND SCREEN
ABO/RH(D): B POS
ANTIBODY SCREEN: NEGATIVE
UNIT DIVISION: 0
Unit division: 0

## 2014-04-12 LAB — BASIC METABOLIC PANEL
ANION GAP: 11 (ref 5–15)
ANION GAP: 13 (ref 5–15)
Anion gap: 12 (ref 5–15)
BUN: 17 mg/dL (ref 6–23)
BUN: 17 mg/dL (ref 6–23)
BUN: 17 mg/dL (ref 6–23)
CALCIUM: 7.7 mg/dL — AB (ref 8.4–10.5)
CHLORIDE: 100 meq/L (ref 96–112)
CHLORIDE: 106 meq/L (ref 96–112)
CHLORIDE: 109 meq/L (ref 96–112)
CO2: 22 mEq/L (ref 19–32)
CO2: 22 meq/L (ref 19–32)
CO2: 25 meq/L (ref 19–32)
Calcium: 10 mg/dL (ref 8.4–10.5)
Calcium: 7.8 mg/dL — ABNORMAL LOW (ref 8.4–10.5)
Creatinine, Ser: 2.49 mg/dL — ABNORMAL HIGH (ref 0.50–1.35)
Creatinine, Ser: 1.43 mg/dL — ABNORMAL HIGH (ref 0.50–1.35)
Creatinine, Ser: 1.67 mg/dL — ABNORMAL HIGH (ref 0.50–1.35)
GFR calc Af Amer: 39 mL/min — ABNORMAL LOW (ref >90)
GFR calc non Af Amer: 66 mL/min — ABNORMAL LOW (ref >90)
GFR calc non Af Amer: 55 mL/min — ABNORMAL LOW (ref >90)
GFR, EST AFRICAN AMERICAN: 77 mL/min — AB (ref >90)
GFR, EST AFRICAN AMERICAN: 63 mL/min — AB (ref >90)
GFR, EST NON AFRICAN AMERICAN: 34 mL/min — AB (ref >90)
Glucose, Bld: 173 mg/dL — ABNORMAL HIGH (ref 70–99)
Glucose, Bld: 157 mg/dL — ABNORMAL HIGH (ref 70–99)
Glucose, Bld: 117 mg/dL — ABNORMAL HIGH (ref 70–99)
POTASSIUM: 4.7 meq/L (ref 3.7–5.3)
Potassium: 4.2 mEq/L (ref 3.7–5.3)
Potassium: 4 mEq/L (ref 3.7–5.3)
SODIUM: 142 meq/L (ref 137–147)
SODIUM: 141 meq/L (ref 137–147)
Sodium: 137 mEq/L (ref 137–147)

## 2014-04-12 LAB — PROTIME-INR
INR: 1.26 (ref 0.00–1.49)
INR: 1.27 (ref 0.00–1.49)
PROTHROMBIN TIME: 15.9 s — AB (ref 11.6–15.2)
PROTHROMBIN TIME: 16 s — AB (ref 11.6–15.2)

## 2014-04-12 LAB — POCT I-STAT 7, (LYTES, BLD GAS, ICA,H+H)
ACID-BASE DEFICIT: 5 mmol/L — AB (ref 0.0–2.0)
Acid-base deficit: 3 mmol/L — ABNORMAL HIGH (ref 0.0–2.0)
Bicarbonate: 24 mEq/L (ref 20.0–24.0)
Bicarbonate: 23.1 meq/L (ref 20.0–24.0)
Calcium, Ion: 0.86 mmol/L — ABNORMAL LOW (ref 1.12–1.23)
Calcium, Ion: 0.97 mmol/L — ABNORMAL LOW (ref 1.12–1.23)
HEMATOCRIT: 24 % — AB (ref 39.0–52.0)
HEMATOCRIT: 30 % — AB (ref 39.0–52.0)
HEMOGLOBIN: 8.2 g/dL — AB (ref 13.0–17.0)
HEMOGLOBIN: 10.2 g/dL — AB (ref 13.0–17.0)
O2 SAT: 100 %
O2 Saturation: 100 %
PCO2 ART: 51.1 mmHg — AB (ref 35.0–45.0)
PO2 ART: 520 mmHg — AB (ref 80.0–100.0)
POTASSIUM: 4.6 meq/L (ref 3.7–5.3)
Patient temperature: 35
Potassium: 4.6 mEq/L (ref 3.7–5.3)
Sodium: 138 mEq/L (ref 137–147)
Sodium: 136 mEq/L — ABNORMAL LOW (ref 137–147)
TCO2: 26 mmol/L (ref 0–100)
TCO2: 25 mmol/L (ref 0–100)
pCO2 arterial: 52.8 mmHg — ABNORMAL HIGH (ref 35.0–45.0)
pH, Arterial: 7.267 — ABNORMAL LOW (ref 7.350–7.450)
pH, Arterial: 7.252 — ABNORMAL LOW (ref 7.350–7.450)
pO2, Arterial: 531 mmHg — ABNORMAL HIGH (ref 80.0–100.0)

## 2014-04-12 LAB — PREPARE PLATELET PHERESIS: Unit division: 0

## 2014-04-12 LAB — POCT I-STAT 3, ART BLOOD GAS (G3+)
Acid-Base Excess: 1 mmol/L (ref 0.0–2.0)
Acid-base deficit: 2 mmol/L (ref 0.0–2.0)
Acid-base deficit: 3 mmol/L — ABNORMAL HIGH (ref 0.0–2.0)
BICARBONATE: 24.3 meq/L — AB (ref 20.0–24.0)
Bicarbonate: 26.9 meq/L — ABNORMAL HIGH (ref 20.0–24.0)
Bicarbonate: 23.2 meq/L (ref 20.0–24.0)
O2 Saturation: 100 %
O2 Saturation: 99 %
O2 Saturation: 100 %
PH ART: 7.323 — AB (ref 7.350–7.450)
PO2 ART: 465 mmHg — AB (ref 80.0–100.0)
TCO2: 28 mmol/L (ref 0–100)
TCO2: 26 mmol/L (ref 0–100)
TCO2: 24 mmol/L (ref 0–100)
pCO2 arterial: 45.1 mmHg — ABNORMAL HIGH (ref 35.0–45.0)
pCO2 arterial: 46.6 mmHg — ABNORMAL HIGH (ref 35.0–45.0)
pCO2 arterial: 43.2 mmHg (ref 35.0–45.0)
pH, Arterial: 7.378 (ref 7.350–7.450)
pH, Arterial: 7.335 — ABNORMAL LOW (ref 7.350–7.450)
pO2, Arterial: 172 mmHg — ABNORMAL HIGH (ref 80.0–100.0)
pO2, Arterial: 241 mmHg — ABNORMAL HIGH (ref 80.0–100.0)

## 2014-04-12 LAB — POCT I-STAT, CHEM 8
BUN: 19 mg/dL (ref 6–23)
CREATININE: 2.2 mg/dL — AB (ref 0.50–1.35)
Calcium, Ion: 1.37 mmol/L — ABNORMAL HIGH (ref 1.12–1.23)
Chloride: 101 meq/L (ref 96–112)
Glucose, Bld: 175 mg/dL — ABNORMAL HIGH (ref 70–99)
HEMATOCRIT: 23 % — AB (ref 39.0–52.0)
Hemoglobin: 7.8 g/dL — ABNORMAL LOW (ref 13.0–17.0)
POTASSIUM: 4.2 meq/L (ref 3.7–5.3)
SODIUM: 139 meq/L (ref 137–147)
TCO2: 24 mmol/L (ref 0–100)

## 2014-04-12 LAB — GLUCOSE, CAPILLARY
GLUCOSE-CAPILLARY: 89 mg/dL (ref 70–99)
Glucose-Capillary: 118 mg/dL — ABNORMAL HIGH (ref 70–99)

## 2014-04-12 LAB — LACTIC ACID, PLASMA
Lactic Acid, Venous: 4.4 mmol/L — ABNORMAL HIGH (ref 0.5–2.2)
Lactic Acid, Venous: 3.1 mmol/L — ABNORMAL HIGH (ref 0.5–2.2)
Lactic Acid, Venous: 2.3 mmol/L — ABNORMAL HIGH (ref 0.5–2.2)

## 2014-04-12 LAB — ABO/RH: ABO/RH(D): B POS

## 2014-04-12 LAB — PREPARE RBC (CROSSMATCH)

## 2014-04-12 LAB — MRSA PCR SCREENING: MRSA by PCR: NEGATIVE

## 2014-04-12 LAB — TRIGLYCERIDES: Triglycerides: 192 mg/dL — ABNORMAL HIGH (ref <150)

## 2014-04-12 SURGERY — LAPAROTOMY, EXPLORATORY
Anesthesia: General | Site: Abdomen

## 2014-04-12 SURGERY — LAPAROTOMY, EXPLORATORY
Anesthesia: General

## 2014-04-12 MED ORDER — PROTHROMBIN COMPLEX CONC HUMAN 500 UNITS IV KIT
1579.0000 [IU] | PACK | Status: AC
  Administered 2014-04-12: 1579 [IU] via INTRAVENOUS
  Filled 2014-04-12: qty 63

## 2014-04-12 MED ORDER — ROCURONIUM BROMIDE 100 MG/10ML IV SOLN
INTRAVENOUS | Status: DC | PRN
  Administered 2014-04-12: 50 mg via INTRAVENOUS

## 2014-04-12 MED ORDER — INSULIN ASPART 100 UNIT/ML ~~LOC~~ SOLN
0.0000 [IU] | Freq: Three times a day (TID) | SUBCUTANEOUS | Status: DC
  Administered 2014-04-12: 1 [IU] via SUBCUTANEOUS

## 2014-04-12 MED ORDER — MIDAZOLAM HCL 2 MG/2ML IJ SOLN
INTRAMUSCULAR | Status: AC
  Filled 2014-04-12: qty 2

## 2014-04-12 MED ORDER — SODIUM BICARBONATE 8.4 % IV SOLN
INTRAVENOUS | Status: AC
  Filled 2014-04-12: qty 50

## 2014-04-12 MED ORDER — SODIUM CHLORIDE 0.9 % IV SOLN
100.0000 ug/h | INTRAVENOUS | Status: DC
  Administered 2014-04-12 (×2): 100 ug/h via INTRAVENOUS
  Administered 2014-04-13: 200 ug/h via INTRAVENOUS
  Administered 2014-04-13: 150 ug/h via INTRAVENOUS
  Administered 2014-04-14 – 2014-04-15 (×2): 200 ug/h via INTRAVENOUS
  Filled 2014-04-12 (×6): qty 50

## 2014-04-12 MED ORDER — CHLORHEXIDINE GLUCONATE 0.12 % MT SOLN
15.0000 mL | Freq: Two times a day (BID) | OROMUCOSAL | Status: DC
  Administered 2014-04-12: 15 mL via OROMUCOSAL
  Filled 2014-04-12: qty 15

## 2014-04-12 MED ORDER — EPHEDRINE SULFATE 50 MG/ML IJ SOLN
INTRAMUSCULAR | Status: AC
  Filled 2014-04-12: qty 1

## 2014-04-12 MED ORDER — VANCOMYCIN HCL IN DEXTROSE 1-5 GM/200ML-% IV SOLN
1000.0000 mg | Freq: Two times a day (BID) | INTRAVENOUS | Status: DC
  Administered 2014-04-12 – 2014-04-13 (×2): 1000 mg via INTRAVENOUS
  Filled 2014-04-12 (×3): qty 200

## 2014-04-12 MED ORDER — GLYCOPYRROLATE 0.2 MG/ML IJ SOLN
INTRAMUSCULAR | Status: AC
  Filled 2014-04-12: qty 2

## 2014-04-12 MED ORDER — ALBUMIN HUMAN 5 % IV SOLN
12.5000 g | Freq: Once | INTRAVENOUS | Status: AC
  Administered 2014-04-12: 12.5 g via INTRAVENOUS

## 2014-04-12 MED ORDER — PIPERACILLIN-TAZOBACTAM 3.375 G IVPB
3.3750 g | Freq: Three times a day (TID) | INTRAVENOUS | Status: DC
  Administered 2014-04-12 – 2014-04-16 (×11): 3.375 g via INTRAVENOUS
  Filled 2014-04-12 (×13): qty 50

## 2014-04-12 MED ORDER — ALBUMIN HUMAN 5 % IV SOLN
INTRAVENOUS | Status: AC
  Filled 2014-04-12: qty 250

## 2014-04-12 MED ORDER — PANTOPRAZOLE SODIUM 40 MG PO TBEC
40.0000 mg | DELAYED_RELEASE_TABLET | Freq: Every day | ORAL | Status: DC
  Administered 2014-04-19 – 2014-04-20 (×2): 40 mg via ORAL
  Filled 2014-04-12 (×2): qty 1

## 2014-04-12 MED ORDER — SODIUM CHLORIDE 0.9 % IV SOLN
INTRAVENOUS | Status: DC
  Administered 2014-04-12: via INTRAVENOUS

## 2014-04-12 MED ORDER — FENTANYL CITRATE 0.05 MG/ML IJ SOLN
INTRAMUSCULAR | Status: DC | PRN
  Administered 2014-04-12: 100 ug via INTRAVENOUS
  Administered 2014-04-12: 250 ug via INTRAVENOUS
  Administered 2014-04-12: 150 ug via INTRAVENOUS

## 2014-04-12 MED ORDER — VITAMIN K1 10 MG/ML IJ SOLN
10.0000 mg | INTRAVENOUS | Status: AC
  Administered 2014-04-12: 10 mg via INTRAVENOUS
  Filled 2014-04-12: qty 1

## 2014-04-12 MED ORDER — CETYLPYRIDINIUM CHLORIDE 0.05 % MT LIQD
7.0000 mL | Freq: Four times a day (QID) | OROMUCOSAL | Status: DC
  Administered 2014-04-12 – 2014-04-15 (×11): 7 mL via OROMUCOSAL

## 2014-04-12 MED ORDER — FENTANYL CITRATE 0.05 MG/ML IJ SOLN
100.0000 ug | INTRAMUSCULAR | Status: DC | PRN
  Administered 2014-04-12 (×2): 100 ug via INTRAVENOUS
  Filled 2014-04-12 (×3): qty 2

## 2014-04-12 MED ORDER — ROCURONIUM BROMIDE 50 MG/5ML IV SOLN
INTRAVENOUS | Status: AC
Start: 2014-04-12 — End: 2014-04-12
  Filled 2014-04-12: qty 3

## 2014-04-12 MED ORDER — ONDANSETRON HCL 4 MG/2ML IJ SOLN
4.0000 mg | Freq: Four times a day (QID) | INTRAMUSCULAR | Status: DC | PRN
  Administered 2014-04-17 – 2014-04-26 (×13): 4 mg via INTRAVENOUS
  Filled 2014-04-12 (×13): qty 2

## 2014-04-12 MED ORDER — CIPROFLOXACIN IN D5W 400 MG/200ML IV SOLN
400.0000 mg | INTRAVENOUS | Status: AC
  Administered 2014-04-12: 400 mg via INTRAVENOUS
  Filled 2014-04-12: qty 200

## 2014-04-12 MED ORDER — PHENYLEPHRINE HCL 10 MG/ML IJ SOLN
0.0000 ug/min | INTRAMUSCULAR | Status: DC
  Filled 2014-04-12: qty 1

## 2014-04-12 MED ORDER — FENTANYL CITRATE 0.05 MG/ML IJ SOLN
INTRAMUSCULAR | Status: AC
  Filled 2014-04-12: qty 5

## 2014-04-12 MED ORDER — PROPOFOL 10 MG/ML IV EMUL
0.0000 ug/kg/min | INTRAVENOUS | Status: DC
  Administered 2014-04-12: 62 ug/kg/min via INTRAVENOUS
  Administered 2014-04-12: 50 ug/kg/min via INTRAVENOUS
  Administered 2014-04-12: 30 ug/kg/min via INTRAVENOUS
  Administered 2014-04-12: 62 ug/kg/min via INTRAVENOUS
  Administered 2014-04-13: 35 ug/kg/min via INTRAVENOUS
  Administered 2014-04-13 (×3): 30 ug/kg/min via INTRAVENOUS
  Administered 2014-04-14: 40 ug/kg/min via INTRAVENOUS
  Administered 2014-04-14: 35 ug/kg/min via INTRAVENOUS
  Administered 2014-04-15 (×2): 40 ug/kg/min via INTRAVENOUS
  Filled 2014-04-12 (×13): qty 100

## 2014-04-12 MED ORDER — CALCIUM CHLORIDE 10 % IV SOLN
INTRAVENOUS | Status: AC
  Filled 2014-04-12: qty 20

## 2014-04-12 MED ORDER — SODIUM CHLORIDE 0.9 % IJ SOLN
INTRAMUSCULAR | Status: AC
  Filled 2014-04-12: qty 10

## 2014-04-12 MED ORDER — ALBUMIN HUMAN 5 % IV SOLN
12.5000 g | Freq: Once | INTRAVENOUS | Status: DC
  Filled 2014-04-12: qty 500

## 2014-04-12 MED ORDER — SODIUM CHLORIDE 0.9 % IV SOLN
Freq: Once | INTRAVENOUS | Status: AC
  Administered 2014-04-12: via INTRAVENOUS

## 2014-04-12 MED ORDER — HYDRALAZINE HCL 20 MG/ML IJ SOLN
10.0000 mg | Freq: Four times a day (QID) | INTRAMUSCULAR | Status: DC | PRN
  Administered 2014-04-14 – 2014-04-29 (×3): 10 mg via INTRAVENOUS
  Filled 2014-04-12 (×4): qty 1

## 2014-04-12 MED ORDER — SODIUM CHLORIDE 0.9 % IV SOLN
Freq: Once | INTRAVENOUS | Status: AC
  Administered 2014-04-12: 05:00:00 via INTRAVENOUS

## 2014-04-12 MED ORDER — ONDANSETRON HCL 4 MG PO TABS: 4.0000 mg | ORAL_TABLET | Freq: Four times a day (QID) | ORAL | Status: DC | PRN

## 2014-04-12 MED ORDER — MIDAZOLAM HCL 2 MG/2ML IJ SOLN
2.0000 mg | INTRAMUSCULAR | Status: DC | PRN
  Administered 2014-04-12: 2 mg via INTRAVENOUS
  Filled 2014-04-12 (×3): qty 2

## 2014-04-12 MED ORDER — PANTOPRAZOLE SODIUM 40 MG IV SOLR
40.0000 mg | Freq: Every day | INTRAVENOUS | Status: DC
  Administered 2014-04-12 – 2014-04-18 (×7): 40 mg via INTRAVENOUS
  Filled 2014-04-12 (×12): qty 40

## 2014-04-12 MED ORDER — PHENYLEPHRINE HCL 10 MG/ML IJ SOLN
0.0000 ug/min | INTRAMUSCULAR | Status: DC
  Filled 2014-04-12: qty 4

## 2014-04-12 MED ORDER — PROPOFOL INFUSION 10 MG/ML OPTIME
INTRAVENOUS | Status: DC | PRN
  Administered 2014-04-12: 50 ug/kg/min via INTRAVENOUS

## 2014-04-12 MED ORDER — 0.9 % SODIUM CHLORIDE (POUR BTL) OPTIME
TOPICAL | Status: DC | PRN
  Administered 2014-04-12: 1000 mL

## 2014-04-12 MED ORDER — IOHEXOL 300 MG/ML  SOLN
50.0000 mL | Freq: Once | INTRAMUSCULAR | Status: AC | PRN
  Administered 2014-04-12: 50 mL via INTRAVENOUS

## 2014-04-12 MED ORDER — MIDAZOLAM HCL 2 MG/2ML IJ SOLN
INTRAMUSCULAR | Status: DC | PRN
  Administered 2014-04-12: 4 mg via INTRAVENOUS

## 2014-04-12 MED ORDER — ALBUMIN HUMAN 5 % IV SOLN
INTRAVENOUS | Status: AC
  Administered 2014-04-12: 06:00:00 via INTRAVENOUS
  Filled 2014-04-12: qty 500

## 2014-04-12 MED ORDER — PHENYLEPHRINE HCL 10 MG/ML IJ SOLN
INTRAMUSCULAR | Status: AC
  Filled 2014-04-12: qty 1

## 2014-04-12 MED ORDER — SODIUM CHLORIDE 0.9 % IV SOLN
Freq: Once | INTRAVENOUS | Status: AC
  Administered 2014-04-12: 04:00:00 via INTRAVENOUS

## 2014-04-12 MED ORDER — PHENYLEPHRINE 40 MCG/ML (10ML) SYRINGE FOR IV PUSH (FOR BLOOD PRESSURE SUPPORT)
PREFILLED_SYRINGE | INTRAVENOUS | Status: AC
  Filled 2014-04-12: qty 30

## 2014-04-12 MED ORDER — CHLORHEXIDINE GLUCONATE 0.12 % MT SOLN
15.0000 mL | Freq: Two times a day (BID) | OROMUCOSAL | Status: DC
  Administered 2014-04-12 – 2014-04-15 (×7): 15 mL via OROMUCOSAL
  Filled 2014-04-12 (×7): qty 15

## 2014-04-12 MED ORDER — MIDAZOLAM HCL 2 MG/2ML IJ SOLN
2.0000 mg | INTRAMUSCULAR | Status: DC | PRN
  Administered 2014-04-12 (×2): 2 mg via INTRAVENOUS

## 2014-04-12 MED ORDER — ALBUMIN HUMAN 5 % IV SOLN
25.0000 g | Freq: Once | INTRAVENOUS | Status: AC
  Administered 2014-04-12: 25 g via INTRAVENOUS
  Filled 2014-04-12: qty 500

## 2014-04-12 MED ORDER — PROPOFOL 10 MG/ML IV EMUL
INTRAVENOUS | Status: AC
  Filled 2014-04-12: qty 100

## 2014-04-12 MED ORDER — CETYLPYRIDINIUM CHLORIDE 0.05 % MT LIQD
7.0000 mL | Freq: Four times a day (QID) | OROMUCOSAL | Status: DC
  Administered 2014-04-12: 7 mL via OROMUCOSAL

## 2014-04-12 SURGICAL SUPPLY — 50 items
BLADE SURG ROTATE 9660 (MISCELLANEOUS) IMPLANT
CANISTER SUCT 1200ML W/VALVE (MISCELLANEOUS) ×2 IMPLANT
CANISTER SUCTION 2500CC (MISCELLANEOUS) ×3 IMPLANT
CANISTER WOUND CARE 500ML ATS (WOUND CARE) ×2 IMPLANT
CHLORAPREP W/TINT 26ML (MISCELLANEOUS) ×3 IMPLANT
COVER MAYO STAND STRL (DRAPES) IMPLANT
COVER SURGICAL LIGHT HANDLE (MISCELLANEOUS) ×3 IMPLANT
DRAPE LAPAROSCOPIC ABDOMINAL (DRAPES) ×3 IMPLANT
DRAPE PROXIMA HALF (DRAPES) IMPLANT
DRAPE UTILITY XL STRL (DRAPES) ×6 IMPLANT
DRAPE WARM FLUID 44X44 (DRAPE) ×3 IMPLANT
DRSG OPSITE POSTOP 4X10 (GAUZE/BANDAGES/DRESSINGS) IMPLANT
DRSG OPSITE POSTOP 4X8 (GAUZE/BANDAGES/DRESSINGS) IMPLANT
ELECT BLADE 6.5 EXT (BLADE) ×2 IMPLANT
ELECT CAUTERY BLADE 6.4 (BLADE) ×6 IMPLANT
ELECT REM PT RETURN 9FT ADLT (ELECTROSURGICAL) ×3
ELECTRODE REM PT RTRN 9FT ADLT (ELECTROSURGICAL) ×1 IMPLANT
GLOVE BIO SURGEON STRL SZ7 (GLOVE) ×3 IMPLANT
GLOVE BIOGEL PI IND STRL 7.5 (GLOVE) ×1 IMPLANT
GLOVE BIOGEL PI INDICATOR 7.5 (GLOVE) ×4
GOWN STRL REUS W/ TWL LRG LVL3 (GOWN DISPOSABLE) ×3 IMPLANT
GOWN STRL REUS W/TWL LRG LVL3 (GOWN DISPOSABLE) ×9
KIT BASIN OR (CUSTOM PROCEDURE TRAY) ×5 IMPLANT
KIT ROOM TURNOVER OR (KITS) ×3 IMPLANT
LIGASURE IMPACT 36 18CM CVD LR (INSTRUMENTS) ×2 IMPLANT
NS IRRIG 1000ML POUR BTL (IV SOLUTION) ×6 IMPLANT
PACK GENERAL/GYN (CUSTOM PROCEDURE TRAY) ×3 IMPLANT
PAD ARMBOARD 7.5X6 YLW CONV (MISCELLANEOUS) ×3 IMPLANT
PENCIL BUTTON HOLSTER BLD 10FT (ELECTRODE) IMPLANT
SPECIMEN JAR LARGE (MISCELLANEOUS) IMPLANT
SPONGE ABDOMINAL VAC ABTHERA (MISCELLANEOUS) ×2 IMPLANT
SPONGE LAP 18X18 X RAY DECT (DISPOSABLE) ×14 IMPLANT
STAPLER CUT CVD 40MM BLUE (STAPLE) ×2 IMPLANT
STAPLER PROXIMATE 75MM BLUE (STAPLE) ×2 IMPLANT
STAPLER VISISTAT 35W (STAPLE) ×3 IMPLANT
SUCTION POOLE TIP (SUCTIONS) ×3 IMPLANT
SUT PDS AB 1 TP1 96 (SUTURE) ×6 IMPLANT
SUT SILK 2 0 (SUTURE) ×3
SUT SILK 2 0 SH CR/8 (SUTURE) ×3 IMPLANT
SUT SILK 2-0 18XBRD TIE 12 (SUTURE) ×1 IMPLANT
SUT SILK 3 0 (SUTURE)
SUT SILK 3 0 SH CR/8 (SUTURE) ×1 IMPLANT
SUT SILK 3-0 18XBRD TIE 12 (SUTURE) ×1 IMPLANT
SUT VIC AB 3-0 SH 27 (SUTURE)
SUT VIC AB 3-0 SH 27X BRD (SUTURE) IMPLANT
TOWEL OR 17X26 10 PK STRL BLUE (TOWEL DISPOSABLE) ×3 IMPLANT
TRAY FOLEY CATH 16FRSI W/METER (SET/KITS/TRAYS/PACK) ×2 IMPLANT
TUBE CONNECTING 12'X1/4 (SUCTIONS)
TUBE CONNECTING 12X1/4 (SUCTIONS) IMPLANT
YANKAUER SUCT BULB TIP NO VENT (SUCTIONS) IMPLANT
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
## 2014-04-12 NOTE — Sedation Documentation (Signed)
Patient repositioned on table to attempt left neph.
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
## 2014-04-12 NOTE — Anesthesia Preprocedure Evaluation (Signed)
Anesthesia Evaluation   Patient unresponsive    Reviewed: Unable to perform ROS - Chart review onlyPreop documentation limited or incomplete due to emergent nature of procedure.  Airway Mallampati: Intubated       Dental   Pulmonary          Cardiovascular     Neuro/Psych    GI/Hepatic   Endo/Other    Renal/GU      Musculoskeletal   Abdominal   Peds  Hematology   Anesthesia Other Findings   Reproductive/Obstetrics                             Anesthesia Physical Anesthesia Plan  ASA: IV and emergent  Anesthesia Plan:    Post-op Pain Management:    Induction:   Airway Management Planned:   Additional Equipment:   Intra-op Plan:   Post-operative Plan:   Informed Consent:   Plan Discussed with:   Anesthesia Plan Comments:         Anesthesia Quick Evaluation
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
## 2014-04-12 NOTE — Transfer of Care (Signed)
Immediate Anesthesia Transfer of Care Note  Patient: Carlos Lawrence  Procedure(s) Performed: Procedure(s): EXPLORATORY LAPAROTOMY With Sigmoid Bowel Resection (N/A)  Patient Location: ICU  Anesthesia Type:General  Level of Consciousness: sedated, unresponsive and Patient remains intubated per anesthesia plan  Airway & Oxygen Therapy: Patient remains intubated per anesthesia plan  Post-op Assessment: Report given to PACU RN and Post -op Vital signs reviewed and stable  Post vital signs: Reviewed and stable  Complications: No apparent anesthesia complications
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
## 2014-04-12 NOTE — Sedation Documentation (Signed)
Dr. Anselm Pancoast informed that BP is trending down

## 2014-04-12 NOTE — Progress Notes (Signed)
Transported to OR with OR team and RN via AMBU bag. No complications.
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
## 2014-04-12 NOTE — Anesthesia Postprocedure Evaluation (Signed)
  Anesthesia Post-op Note  Patient: Carlos Lawrence  Procedure(s) Performed: Procedure(s): EXPLORATORY LAPAROTOMY With Sigmoid Bowel Resection (N/A)  Patient Location: ICU  Anesthesia Type:General  Level of Consciousness: sedated, unresponsive and Patient remains intubated per anesthesia plan  Airway and Oxygen Therapy: Patient remains intubated per anesthesia plan  Post-op Pain: unable to evaluate due to sedation  Post-op Assessment: Post-op Vital signs reviewed, Patient's Cardiovascular Status Stable and Respiratory Function Stable  Post-op Vital Signs: Reviewed and stable  Last Vitals:  Filed Vitals:   04/12/14 0545  BP: 80/52  Pulse: 99  Temp:   Resp: 16    Complications: No apparent anesthesia complications

## 2014-04-12 NOTE — Sedation Documentation (Signed)
VAC container changed 400 CC of blood tinged drainage noted
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
## 2014-04-12 NOTE — Progress Notes (Signed)
Patient ID: Carlos Lawrence, male   DOB: [birth year], 28 y.o.   MRN: [id number] HD stable. HCT stable. IR unable to place nephrostomy tubes. I spoke with Dr. Wendy Poet and he plans to come to the OR when we take him back for washout/colostomy tomorrow or Monday. He will try to repair bladder at that time. I consulted Dr. Amedeo Plenty for [facility]. I spoke with his mother again.
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
## 2014-04-12 NOTE — Sedation Documentation (Addendum)
450cc from vac, canister changed
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
## 2014-04-13 ENCOUNTER — Inpatient Hospital Stay (HOSPITAL_COMMUNITY): Payer: Self-pay

## 2014-04-13 LAB — POCT I-STAT 3, ART BLOOD GAS (G3+)
ACID-BASE DEFICIT: 1 mmol/L (ref 0.0–2.0)
BICARBONATE: 22.8 meq/L (ref 20.0–24.0)
O2 SAT: 99 %
Patient temperature: 98.4
TCO2: 24 mmol/L (ref 0–100)
pCO2 arterial: 34.3 mmHg — ABNORMAL LOW (ref 35.0–45.0)
pH, Arterial: 7.43 (ref 7.350–7.450)
pO2, Arterial: 135 mmHg — ABNORMAL HIGH (ref 80.0–100.0)

## 2014-04-13 LAB — PREPARE PLATELET PHERESIS: UNIT DIVISION: 0

## 2014-04-13 LAB — CBC
HCT: 28.8 % — ABNORMAL LOW (ref 39.0–52.0)
HCT: 27.7 % — ABNORMAL LOW (ref 39.0–52.0)
HCT: 28.2 % — ABNORMAL LOW (ref 39.0–52.0)
Hemoglobin: 10.5 g/dL — ABNORMAL LOW (ref 13.0–17.0)
Hemoglobin: 10.2 g/dL — ABNORMAL LOW (ref 13.0–17.0)
Hemoglobin: 10 g/dL — ABNORMAL LOW (ref 13.0–17.0)
MCH: 28.8 pg (ref 26.0–34.0)
MCH: 29.8 pg (ref 26.0–34.0)
MCH: 28.8 pg (ref 26.0–34.0)
MCHC: 36.5 g/dL — ABNORMAL HIGH (ref 30.0–36.0)
MCHC: 36.8 g/dL — AB (ref 30.0–36.0)
MCHC: 35.5 g/dL (ref 30.0–36.0)
MCV: 79.1 fL (ref 78.0–100.0)
MCV: 81 fL (ref 78.0–100.0)
MCV: 81.3 fL (ref 78.0–100.0)
PLATELETS: 60 10*3/uL — AB (ref 150–400)
PLATELETS: 60 10*3/uL — AB (ref 150–400)
PLATELETS: 67 10*3/uL — AB (ref 150–400)
RBC: 3.64 MIL/uL — ABNORMAL LOW (ref 4.22–5.81)
RBC: 3.42 MIL/uL — AB (ref 4.22–5.81)
RBC: 3.47 MIL/uL — ABNORMAL LOW (ref 4.22–5.81)
RDW: 14.3 % (ref 11.5–15.5)
RDW: 14.6 % (ref 11.5–15.5)
RDW: 14.6 % (ref 11.5–15.5)
WBC: 11.4 10*3/uL — AB (ref 4.0–10.5)
WBC: 11.6 10*3/uL — ABNORMAL HIGH (ref 4.0–10.5)
WBC: 13.9 10*3/uL — ABNORMAL HIGH (ref 4.0–10.5)

## 2014-04-13 LAB — BASIC METABOLIC PANEL
ANION GAP: 12 (ref 5–15)
BUN: 16 mg/dL (ref 6–23)
CALCIUM: 7.9 mg/dL — AB (ref 8.4–10.5)
CO2: 23 mEq/L (ref 19–32)
Chloride: 105 mEq/L (ref 96–112)
Creatinine, Ser: 1.81 mg/dL — ABNORMAL HIGH (ref 0.50–1.35)
GFR calc non Af Amer: 50 mL/min — ABNORMAL LOW (ref >90)
GFR, EST AFRICAN AMERICAN: 58 mL/min — AB (ref >90)
Glucose, Bld: 126 mg/dL — ABNORMAL HIGH (ref 70–99)
POTASSIUM: 3.9 meq/L (ref 3.7–5.3)
Sodium: 140 mEq/L (ref 137–147)

## 2014-04-13 LAB — GLUCOSE, CAPILLARY
GLUCOSE-CAPILLARY: 109 mg/dL — AB (ref 70–99)
Glucose-Capillary: 135 mg/dL — ABNORMAL HIGH (ref 70–99)

## 2014-04-13 LAB — PROTIME-INR
INR: 1.47 (ref 0.00–1.49)
Prothrombin Time: 18 seconds — ABNORMAL HIGH (ref 11.6–15.2)

## 2014-04-13 MED ORDER — IOHEXOL 300 MG/ML  SOLN
100.0000 mL | Freq: Once | INTRAMUSCULAR | Status: AC | PRN
  Administered 2014-04-13: 100 mL via INTRAVENOUS

## 2014-04-13 MED ORDER — KCL IN DEXTROSE-NACL 20-5-0.45 MEQ/L-%-% IV SOLN
INTRAVENOUS | Status: DC
  Administered 2014-04-13: 100 mL/h via INTRAVENOUS
  Administered 2014-04-14 – 2014-04-20 (×12): via INTRAVENOUS
  Filled 2014-04-13 (×24): qty 1000

## 2014-04-13 MED ORDER — VANCOMYCIN HCL IN DEXTROSE 750-5 MG/150ML-% IV SOLN
750.0000 mg | Freq: Two times a day (BID) | INTRAVENOUS | Status: DC
  Administered 2014-04-13: 750 mg via INTRAVENOUS
  Filled 2014-04-13 (×3): qty 150

## 2014-04-13 MED ORDER — DIATRIZOATE MEGLUMINE 30 % UR SOLN
Freq: Once | URETHRAL | Status: AC | PRN
  Administered 2014-04-13: 10 mL
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
## 2014-04-13 NOTE — Progress Notes (Signed)
Attempted to place PICC unsuccessfully in left brachial and basilic veins.  Unable to thread guide wire past axillary region.  Attempted by 2 PICC nurses.  If PICC line is still desired, may consider IR referral.

## 2014-04-13 NOTE — Progress Notes (Signed)
Reviewed notes  Agree with imaging tomorrow  Will join in surgery for inspection/possible reconstruction of bladder and placement of s/p tube Will need diversion of urine No ilio-conduit now

## 2014-04-13 NOTE — Progress Notes (Signed)
PT transported to XRAY/Fluro for special procedure that is unable to be performed at the bedside. Pt transported on ventilator. Pt stable throughout with no complications. Pt returned to 2M08 upon completion.

## 2014-04-13 NOTE — Progress Notes (Signed)
Transported pt from 2M08 to CT on ventilator. Pt stable throughout. NO complications. Pt returned to 2M08 when finished.

## 2014-04-14 ENCOUNTER — Inpatient Hospital Stay (HOSPITAL_COMMUNITY): Payer: Self-pay | Admitting: Anesthesiology

## 2014-04-14 ENCOUNTER — Encounter (HOSPITAL_COMMUNITY): Admission: EM | Disposition: A | Discharge status: Home Health | Payer: Self-pay | Source: Home / Self Care

## 2014-04-14 ENCOUNTER — Encounter (HOSPITAL_COMMUNITY): Payer: Self-pay | Admitting: Certified Registered Nurse Anesthetist

## 2014-04-14 ENCOUNTER — Inpatient Hospital Stay (HOSPITAL_COMMUNITY): Payer: Self-pay

## 2014-04-14 HISTORY — PX: INSERTION OF SUPRAPUBIC CATHETER: SHX5870

## 2014-04-14 HISTORY — PX: LAPAROTOMY: SHX154

## 2014-04-14 HISTORY — PX: BLADDER NECK RECONSTRUCTION: SHX1239

## 2014-04-14 HISTORY — PX: OSTOMY: SHX5997

## 2014-04-14 LAB — BASIC METABOLIC PANEL
Anion gap: 11 (ref 5–15)
BUN: 11 mg/dL (ref 6–23)
CO2: 24 mEq/L (ref 19–32)
Calcium: 8.1 mg/dL — ABNORMAL LOW (ref 8.4–10.5)
Chloride: 103 mEq/L (ref 96–112)
Creatinine, Ser: 1.38 mg/dL — ABNORMAL HIGH (ref 0.50–1.35)
GFR calc non Af Amer: 69 mL/min — ABNORMAL LOW (ref >90)
GFR, EST AFRICAN AMERICAN: 80 mL/min — AB (ref >90)
GLUCOSE: 107 mg/dL — AB (ref 70–99)
Potassium: 3.6 mEq/L — ABNORMAL LOW (ref 3.7–5.3)
SODIUM: 138 meq/L (ref 137–147)

## 2014-04-14 LAB — CBC
HCT: 26 % — ABNORMAL LOW (ref 39.0–52.0)
Hemoglobin: 9.2 g/dL — ABNORMAL LOW (ref 13.0–17.0)
MCH: 29 pg (ref 26.0–34.0)
MCHC: 35.4 g/dL (ref 30.0–36.0)
MCV: 82 fL (ref 78.0–100.0)
PLATELETS: 68 10*3/uL — AB (ref 150–400)
RBC: 3.17 MIL/uL — ABNORMAL LOW (ref 4.22–5.81)
RDW: 14.3 % (ref 11.5–15.5)
WBC: 11.3 10*3/uL — AB (ref 4.0–10.5)

## 2014-04-14 LAB — POCT I-STAT 3, ART BLOOD GAS (G3+)
Acid-base deficit: 1 mmol/L (ref 0.0–2.0)
Bicarbonate: 23.8 mEq/L (ref 20.0–24.0)
O2 Saturation: 99 %
PCO2 ART: 40.2 mmHg (ref 35.0–45.0)
TCO2: 25 mmol/L (ref 0–100)
pH, Arterial: 7.382 (ref 7.350–7.450)
pO2, Arterial: 140 mmHg — ABNORMAL HIGH (ref 80.0–100.0)

## 2014-04-14 LAB — PROTIME-INR
INR: 1.16 (ref 0.00–1.49)
Prothrombin Time: 14.9 seconds (ref 11.6–15.2)

## 2014-04-14 SURGERY — LAPAROTOMY, EXPLORATORY
Anesthesia: General

## 2014-04-14 MED ORDER — PROPOFOL 10 MG/ML IV BOLUS
INTRAVENOUS | Status: AC
  Filled 2014-04-14: qty 20

## 2014-04-14 MED ORDER — SUCCINYLCHOLINE CHLORIDE 20 MG/ML IJ SOLN
INTRAMUSCULAR | Status: DC | PRN
  Administered 2014-04-14: 100 mg via INTRAVENOUS

## 2014-04-14 MED ORDER — METHYLENE BLUE 1 % INJ SOLN
INTRAMUSCULAR | Status: DC | PRN
  Administered 2014-04-14: 10 mg via INTRAVENOUS

## 2014-04-14 MED ORDER — LACTATED RINGERS IV SOLN
INTRAVENOUS | Status: DC | PRN
  Administered 2014-04-14: 11:00:00 via INTRAVENOUS

## 2014-04-14 MED ORDER — ROCURONIUM BROMIDE 50 MG/5ML IV SOLN
INTRAVENOUS | Status: AC
  Filled 2014-04-14: qty 1

## 2014-04-14 MED ORDER — ACETAMINOPHEN 650 MG RE SUPP
650.0000 mg | Freq: Once | RECTAL | Status: AC
  Administered 2014-04-14: 650 mg via RECTAL
  Filled 2014-04-14: qty 1

## 2014-04-14 MED ORDER — FENTANYL CITRATE 0.05 MG/ML IJ SOLN
INTRAMUSCULAR | Status: DC | PRN
  Administered 2014-04-14: 150 ug via INTRAVENOUS
  Administered 2014-04-14 (×3): 100 ug via INTRAVENOUS
  Administered 2014-04-14 (×4): 50 ug via INTRAVENOUS
  Administered 2014-04-14: 100 ug via INTRAVENOUS

## 2014-04-14 MED ORDER — METOPROLOL TARTRATE 1 MG/ML IV SOLN
10.0000 mg | Freq: Once | INTRAVENOUS | Status: AC
  Administered 2014-04-14: 10 mg via INTRAVENOUS
  Filled 2014-04-14: qty 10

## 2014-04-14 MED ORDER — METOPROLOL TARTRATE 1 MG/ML IV SOLN
10.0000 mg | Freq: Four times a day (QID) | INTRAVENOUS | Status: DC | PRN
  Administered 2014-04-15 – 2014-04-29 (×5): 10 mg via INTRAVENOUS
  Filled 2014-04-14 (×8): qty 10

## 2014-04-14 MED ORDER — LACTATED RINGERS IV SOLN
INTRAVENOUS | Status: DC | PRN
  Administered 2014-04-14 (×2): via INTRAVENOUS

## 2014-04-14 MED ORDER — METHYLENE BLUE 1 % INJ SOLN
INTRAMUSCULAR | Status: AC
  Filled 2014-04-14: qty 10

## 2014-04-14 MED ORDER — FENTANYL CITRATE 0.05 MG/ML IJ SOLN
INTRAMUSCULAR | Status: AC
  Filled 2014-04-14: qty 5

## 2014-04-14 MED ORDER — VANCOMYCIN HCL IN DEXTROSE 1-5 GM/200ML-% IV SOLN
1000.0000 mg | Freq: Two times a day (BID) | INTRAVENOUS | Status: DC
  Administered 2014-04-14 – 2014-04-15 (×4): 1000 mg via INTRAVENOUS
  Filled 2014-04-14 (×6): qty 200

## 2014-04-14 MED ORDER — ROCURONIUM BROMIDE 100 MG/10ML IV SOLN
INTRAVENOUS | Status: DC | PRN
  Administered 2014-04-14: 50 mg via INTRAVENOUS
  Administered 2014-04-14: 30 mg via INTRAVENOUS

## 2014-04-14 MED ORDER — SODIUM CHLORIDE 0.9 % IV SOLN
INTRAVENOUS | Status: DC | PRN
  Administered 2014-04-14: 13:00:00 via INTRAVENOUS

## 2014-04-14 MED ORDER — PROPOFOL 10 MG/ML IV BOLUS
INTRAVENOUS | Status: DC | PRN
  Administered 2014-04-14 (×2): 50 mg via INTRAVENOUS
  Administered 2014-04-14: 100 mg via INTRAVENOUS

## 2014-04-14 MED ORDER — MIDAZOLAM HCL 5 MG/5ML IJ SOLN
INTRAMUSCULAR | Status: DC | PRN
  Administered 2014-04-14: 2 mg via INTRAVENOUS

## 2014-04-14 MED ORDER — MIDAZOLAM HCL 2 MG/2ML IJ SOLN
INTRAMUSCULAR | Status: AC
  Filled 2014-04-14: qty 2

## 2014-04-14 MED ORDER — STERILE WATER FOR IRRIGATION IR SOLN
Status: DC | PRN
  Administered 2014-04-14: 200 mL

## 2014-04-14 MED ORDER — SUCCINYLCHOLINE CHLORIDE 20 MG/ML IJ SOLN
INTRAMUSCULAR | Status: AC
  Filled 2014-04-14: qty 1

## 2014-04-14 MED ORDER — 0.9 % SODIUM CHLORIDE (POUR BTL) OPTIME
TOPICAL | Status: DC | PRN
  Administered 2014-04-14 (×3): 1000 mL

## 2014-04-14 SURGICAL SUPPLY — 76 items
BAG URINE DRAINAGE (UROLOGICAL SUPPLIES) ×4 IMPLANT
BLADE 10 SAFETY STRL DISP (BLADE) ×3 IMPLANT
BLADE SURG ROTATE 9660 (MISCELLANEOUS) IMPLANT
BNDG GAUZE ELAST 4 BULKY (GAUZE/BANDAGES/DRESSINGS) ×2 IMPLANT
BRR ADH 5X3 SEPRAFILM 6 SHT (MISCELLANEOUS)
CANISTER SUCTION 2500CC (MISCELLANEOUS) ×3 IMPLANT
CATH FOLEY 2WAY SLVR  5CC 12FR (CATHETERS) ×2
CATH FOLEY 2WAY SLVR  5CC 14FR (CATHETERS) ×2
CATH FOLEY 2WAY SLVR  5CC 20FR (CATHETERS) ×2
CATH FOLEY 2WAY SLVR 5CC 12FR (CATHETERS) IMPLANT
CATH FOLEY 2WAY SLVR 5CC 14FR (CATHETERS) IMPLANT
CATH FOLEY 2WAY SLVR 5CC 20FR (CATHETERS) IMPLANT
CATH URET 5FR 28IN OPEN ENDED (CATHETERS) ×4 IMPLANT
CHLORAPREP W/TINT 26ML (MISCELLANEOUS) ×3 IMPLANT
COVER MAYO STAND STRL (DRAPES) IMPLANT
COVER SURGICAL LIGHT HANDLE (MISCELLANEOUS) ×5 IMPLANT
DRAIN CHANNEL 19F RND (DRAIN) ×2 IMPLANT
DRAPE LAPAROSCOPIC ABDOMINAL (DRAPES) ×3 IMPLANT
DRAPE PROXIMA HALF (DRAPES) IMPLANT
DRAPE UTILITY XL STRL (DRAPES) ×8 IMPLANT
DRAPE WARM FLUID 44X44 (DRAPE) ×3 IMPLANT
DRSG OPSITE POSTOP 4X10 (GAUZE/BANDAGES/DRESSINGS) IMPLANT
DRSG OPSITE POSTOP 4X8 (GAUZE/BANDAGES/DRESSINGS) IMPLANT
ELECT BLADE 6.5 EXT (BLADE) IMPLANT
ELECT CAUTERY BLADE 6.4 (BLADE) ×8 IMPLANT
ELECT REM PT RETURN 9FT ADLT (ELECTROSURGICAL) ×3
ELECTRODE REM PT RTRN 9FT ADLT (ELECTROSURGICAL) ×1 IMPLANT
EVACUATOR SILICONE 100CC (DRAIN) ×2 IMPLANT
GAUZE SPONGE 4X4 16PLY XRAY LF (GAUZE/BANDAGES/DRESSINGS) ×2 IMPLANT
GLOVE BIO SURGEON STRL SZ7 (GLOVE) ×2 IMPLANT
GLOVE BIOGEL PI IND STRL 7.0 (GLOVE) IMPLANT
GLOVE BIOGEL PI IND STRL 7.5 (GLOVE) IMPLANT
GLOVE BIOGEL PI IND STRL 8 (GLOVE) ×1 IMPLANT
GLOVE BIOGEL PI INDICATOR 7.0 (GLOVE) ×4
GLOVE BIOGEL PI INDICATOR 7.5 (GLOVE) ×2
GLOVE BIOGEL PI INDICATOR 8 (GLOVE) ×2
GLOVE ECLIPSE 7.5 STRL STRAW (GLOVE) ×3 IMPLANT
GLOVE SURG SS PI 7.0 STRL IVOR (GLOVE) ×2 IMPLANT
GOWN STRL REUS W/ TWL LRG LVL3 (GOWN DISPOSABLE) ×3 IMPLANT
GOWN STRL REUS W/TWL LRG LVL3 (GOWN DISPOSABLE) ×15
GUIDEWIRE STR DUAL SENSOR (WIRE) ×2 IMPLANT
KIT BASIN OR (CUSTOM PROCEDURE TRAY) ×3 IMPLANT
KIT COLOSTOMY ILEOSTOMY 4 (WOUND CARE) ×2 IMPLANT
KIT ROOM TURNOVER OR (KITS) ×6 IMPLANT
LIGASURE IMPACT 36 18CM CVD LR (INSTRUMENTS) IMPLANT
NS IRRIG 1000ML POUR BTL (IV SOLUTION) ×6 IMPLANT
PACK GENERAL/GYN (CUSTOM PROCEDURE TRAY) ×3 IMPLANT
PAD ABD 8X10 STRL (GAUZE/BANDAGES/DRESSINGS) ×4 IMPLANT
PAD ARMBOARD 7.5X6 YLW CONV (MISCELLANEOUS) ×9 IMPLANT
PENCIL BUTTON HOLSTER BLD 10FT (ELECTRODE) IMPLANT
SEPRAFILM PROCEDURAL PACK 3X5 (MISCELLANEOUS) IMPLANT
SPECIMEN JAR LARGE (MISCELLANEOUS) IMPLANT
SPONGE GAUZE 4X4 12PLY STER LF (GAUZE/BANDAGES/DRESSINGS) ×2 IMPLANT
SPONGE LAP 18X18 X RAY DECT (DISPOSABLE) ×2 IMPLANT
STAPLER VISISTAT 35W (STAPLE) ×3 IMPLANT
SUCTION POOLE TIP (SUCTIONS) ×3 IMPLANT
SUT ETHILON 2 0 FS 18 (SUTURE) ×2 IMPLANT
SUT NOVA 1 T20/GS 25DT (SUTURE) IMPLANT
SUT PDS AB 1 TP1 96 (SUTURE) ×6 IMPLANT
SUT SILK 2 0 FS (SUTURE) ×2 IMPLANT
SUT SILK 2 0 SH CR/8 (SUTURE) ×3 IMPLANT
SUT SILK 2 0 TIES 10X30 (SUTURE) ×5 IMPLANT
SUT SILK 3 0 SH CR/8 (SUTURE) ×3 IMPLANT
SUT SILK 3 0 TIES 10X30 (SUTURE) ×3 IMPLANT
SUT VIC AB 2-0 SH 18 (SUTURE) ×2 IMPLANT
SUT VIC AB 2-0 SH 27 (SUTURE) ×54
SUT VIC AB 2-0 SH 27X BRD (SUTURE) IMPLANT
SUT VIC AB 3-0 SH 18 (SUTURE) ×2 IMPLANT
SYRINGE 10CC LL (SYRINGE) ×2 IMPLANT
TAPE CLOTH SURG 6X10 WHT LF (GAUZE/BANDAGES/DRESSINGS) ×2 IMPLANT
TOWEL OR 17X24 6PK STRL BLUE (TOWEL DISPOSABLE) ×2 IMPLANT
TOWEL OR 17X26 10 PK STRL BLUE (TOWEL DISPOSABLE) ×3 IMPLANT
TRAY FOLEY CATH 16FRSI W/METER (SET/KITS/TRAYS/PACK) IMPLANT
TUBE CONNECTING 12'X1/4 (SUCTIONS)
TUBE CONNECTING 12X1/4 (SUCTIONS) IMPLANT
YANKAUER SUCT BULB TIP NO VENT (SUCTIONS) IMPLANT

## 2014-04-14 NOTE — Anesthesia Postprocedure Evaluation (Signed)
  Anesthesia Post-op Note  Patient: Carlos Lawrence  Procedure(s) Performed: Procedure(s): EXPLORATORY LAPAROTOMY (N/A) Repair Lacerated Bladder (N/A) INSERTION OF SUPRAPUBIC CATHETER (N/A)  colostomy creation (N/A)  Patient Location: SICU  Anesthesia Type:General  Level of Consciousness: sedated and Patient remains intubated per anesthesia plan  Airway and Oxygen Therapy: Patient remains intubated per anesthesia plan and Patient placed on Ventilator (see vital sign flow sheet for setting)  Post-op Pain: none  Post-op Assessment: Post-op Vital signs reviewed, Patient's Cardiovascular Status Stable, Respiratory Function Stable, Patent Airway and Pain level controlled  Post-op Vital Signs: stable  Last Vitals:  Filed Vitals:   04/14/14 1700  BP: 149/106  Pulse: 88  Temp:   Resp: 16    Complications: No apparent anesthesia complications

## 2014-04-14 NOTE — Progress Notes (Signed)
Pt back from OR to 2M08.

## 2014-04-14 NOTE — Progress Notes (Signed)
Patient ID: Carlos Lawrence, male   DOB: [birth year], 28 y.o.   MRN: [id number] Right hand stable Continue to observe D/w Tra Surg yesterday Rec cont ABX Will watch with you Events and challenging abdominal issues noted Kaziyah Parkison MD
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
## 2014-04-14 NOTE — Transfer of Care (Signed)
Immediate Anesthesia Transfer of Care Note  Patient: Carlos Lawrence  Procedure(s) Performed: Procedure(s): EXPLORATORY LAPAROTOMY (N/A) Repair Lacerated Bladder (N/A) INSERTION OF SUPRAPUBIC CATHETER (N/A)  colostomy creation (N/A)  Patient Location: SICU  Anesthesia Type:General  Level of Consciousness: sedated and Patient remains intubated per anesthesia plan  Airway & Oxygen Therapy: Patient remains intubated per anesthesia plan and Patient placed on Ventilator (see vital sign flow sheet for setting)  Post-op Assessment: Report given to PACU RN and Post -op Vital signs reviewed and stable  Post vital signs: Reviewed and stable  Complications: No apparent anesthesia complications
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
## 2014-04-14 NOTE — Progress Notes (Signed)
Pt to OR with CRNA

## 2014-04-14 NOTE — Anesthesia Preprocedure Evaluation (Signed)
Anesthesia Evaluation  Patient identified by MRN, date of birth, ID band Patient unresponsive    Reviewed: Allergy & Precautions, H&P , Patient's Chart, lab work & pertinent test results  Airway        Dental   Pulmonary  Intubated on vent, FIO2 35%         Cardiovascular     Neuro/Psych Responds when sedation less    GI/Hepatic negative GI ROS, Neg liver ROS,   Endo/Other    Renal/GU      Musculoskeletal   Abdominal   Peds  Hematology H/H 9/27, plts 68K,INR 1.15, PT 14.9   Anesthesia Other Findings   Reproductive/Obstetrics                             Anesthesia Physical Anesthesia Plan  ASA: III and emergent  Anesthesia Plan: General   Post-op Pain Management:    Induction: Intravenous  Airway Management Planned:   Additional Equipment:   Intra-op Plan:   Post-operative Plan: Post-operative intubation/ventilation  Informed Consent: I have reviewed the patients History and Physical, chart, labs and discussed the procedure including the risks, benefits and alternatives for the proposed anesthesia with the patient or authorized representative who has indicated his/her understanding and acceptance.     Plan Discussed with:   Anesthesia Plan Comments:         Anesthesia Quick Evaluation
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
## 2014-04-14 NOTE — Progress Notes (Signed)
Pt in OR no vent check completed at this time.

## 2014-04-14 NOTE — OR Nursing (Signed)
Patient arrives to OR with 18x18 sponges packed. 4 laps removed from bladder.

## 2014-04-14 NOTE — Anesthesia Procedure Notes (Signed)
Procedure Name: Intubation Date/Time: 04/14/2014 11:16 AM Performed by: Judeth Cornfield T Pre-anesthesia Checklist: Patient identified, Emergency Drugs available, Suction available, Patient being monitored and Timeout performed Patient Re-evaluated:Patient Re-evaluated prior to inductionOxygen Delivery Method: Circle system utilized Preoxygenation: Pre-oxygenation with 100% oxygen Intubation Type: IV induction Grade View: Grade I Tube size: 8.5 mm Number of attempts: 2 Airway Equipment and Method: Stylet and Video-laryngoscopy Placement Confirmation: ETT inserted through vocal cords under direct vision,  positive ETCO2 and breath sounds checked- equal and bilateral Secured at: 23 cm Tube secured with: Tape Dental Injury: Teeth and Oropharynx as per pre-operative assessment  Comments: Attempted ETT change over cook catheter per ICU request for continuous problems with air leaks despite adequate tracheal cuff pressures, New ETT would not pass over [place], [place] X1 by Dr. Carrie Mew, NGT through cords, NGT removed, DL X1 with Glidescope by [place], CRNA, 8.5ETT passed, + ETCO2, BBS=, VSS, ETT secured at 23 cm at the teeth

## 2014-04-14 NOTE — Progress Notes (Signed)
Pt back to 2S08 from OR and placed on vent. Pt had CXR in OR prior to coming to unit. RT will verify ETT placement.

## 2014-04-15 ENCOUNTER — Encounter (HOSPITAL_COMMUNITY): Payer: Self-pay | Admitting: General Surgery

## 2014-04-15 ENCOUNTER — Encounter (HOSPITAL_COMMUNITY): Payer: Self-pay | Admitting: Anesthesiology

## 2014-04-15 ENCOUNTER — Inpatient Hospital Stay (HOSPITAL_COMMUNITY): Payer: Self-pay

## 2014-04-15 LAB — PREPARE FRESH FROZEN PLASMA
UNIT DIVISION: 0
UNIT DIVISION: 0
UNIT DIVISION: 0
UNIT DIVISION: 0
UNIT DIVISION: 0
UNIT DIVISION: 0
UNIT DIVISION: 0
UNIT DIVISION: 0
UNIT DIVISION: 0
UNIT DIVISION: 0
UNIT DIVISION: 0
Unit division: 0
Unit division: 0
Unit division: 0
Unit division: 0
Unit division: 0
Unit division: 0
Unit division: 0
Unit division: 0
Unit division: 0
Unit division: 0
Unit division: 0
Unit division: 0
Unit division: 0
Unit division: 0

## 2014-04-15 LAB — TYPE AND SCREEN
ABO/RH(D): B POS
Antibody Screen: NEGATIVE
UNIT DIVISION: 0
UNIT DIVISION: 0
UNIT DIVISION: 0
UNIT DIVISION: 0
UNIT DIVISION: 0
UNIT DIVISION: 0
UNIT DIVISION: 0
UNIT DIVISION: 0
Unit division: 0
Unit division: 0
Unit division: 0
Unit division: 0
Unit division: 0
Unit division: 0
Unit division: 0
Unit division: 0
Unit division: 0
Unit division: 0
Unit division: 0
Unit division: 0
Unit division: 0
Unit division: 0
Unit division: 0
Unit division: 0

## 2014-04-15 LAB — POCT I-STAT 3, ART BLOOD GAS (G3+)
ACID-BASE DEFICIT: 2 mmol/L (ref 0.0–2.0)
Bicarbonate: 23.1 meq/L (ref 20.0–24.0)
O2 SAT: 98 %
PCO2 ART: 39.8 mmHg (ref 35.0–45.0)
PH ART: 7.376 (ref 7.350–7.450)
Patient temperature: 100.3
TCO2: 24 mmol/L (ref 0–100)
pO2, Arterial: 106 mmHg — ABNORMAL HIGH (ref 80.0–100.0)

## 2014-04-15 LAB — CBC WITH DIFFERENTIAL/PLATELET
Basophils Absolute: 0 10*3/uL (ref 0.0–0.1)
Basophils Relative: 0 % (ref 0–1)
EOS ABS: 0.2 10*3/uL (ref 0.0–0.7)
Eosinophils Relative: 1 % (ref 0–5)
HCT: 33.2 % — ABNORMAL LOW (ref 39.0–52.0)
Hemoglobin: 11.8 g/dL — ABNORMAL LOW (ref 13.0–17.0)
Lymphocytes Relative: 5 % — ABNORMAL LOW (ref 12–46)
Lymphs Abs: 0.6 10*3/uL — ABNORMAL LOW (ref 0.7–4.0)
MCH: 29.3 pg (ref 26.0–34.0)
MCHC: 35.5 g/dL (ref 30.0–36.0)
MCV: 82.4 fL (ref 78.0–100.0)
MONOS PCT: 5 % (ref 3–12)
Monocytes Absolute: 0.6 10*3/uL (ref 0.1–1.0)
NEUTROS ABS: 10.8 10*3/uL — AB (ref 1.7–7.7)
Neutrophils Relative %: 89 % — ABNORMAL HIGH (ref 43–77)
Platelets: 73 10*3/uL — ABNORMAL LOW (ref 150–400)
RBC: 4.03 MIL/uL — ABNORMAL LOW (ref 4.22–5.81)
RDW: 13.9 % (ref 11.5–15.5)
WBC: 12.1 10*3/uL — ABNORMAL HIGH (ref 4.0–10.5)

## 2014-04-15 LAB — BASIC METABOLIC PANEL
Anion gap: 6 (ref 5–15)
BUN: 8 mg/dL (ref 6–23)
CALCIUM: 7.4 mg/dL — AB (ref 8.4–10.5)
CO2: 24 mmol/L (ref 19–32)
Chloride: 106 mEq/L (ref 96–112)
Creatinine, Ser: 1.31 mg/dL (ref 0.50–1.35)
GFR, EST AFRICAN AMERICAN: 85 mL/min — AB (ref >90)
GFR, EST NON AFRICAN AMERICAN: 73 mL/min — AB (ref >90)
Glucose, Bld: 141 mg/dL — ABNORMAL HIGH (ref 70–99)
Potassium: 3.6 mmol/L (ref 3.5–5.1)
Sodium: 136 mmol/L (ref 135–145)

## 2014-04-15 LAB — TRIGLYCERIDES: Triglycerides: 897 mg/dL — ABNORMAL HIGH (ref <150)

## 2014-04-15 MED ORDER — HYDROMORPHONE HCL 1 MG/ML IJ SOLN
1.0000 mg | INTRAMUSCULAR | Status: DC | PRN
  Administered 2014-04-15 (×3): 2 mg via INTRAVENOUS
  Administered 2014-04-15 – 2014-04-16 (×2): 1 mg via INTRAVENOUS
  Administered 2014-04-16: 2 mg via INTRAVENOUS
  Administered 2014-04-16: 1 mg via INTRAVENOUS
  Administered 2014-04-16: 2 mg via INTRAVENOUS
  Administered 2014-04-16 (×2): 1 mg via INTRAVENOUS
  Administered 2014-04-16: 2 mg via INTRAVENOUS
  Administered 2014-04-16 – 2014-04-18 (×17): 1 mg via INTRAVENOUS
  Administered 2014-04-18 (×3): 2 mg via INTRAVENOUS
  Administered 2014-04-18: 1 mg via INTRAVENOUS
  Administered 2014-04-18 (×2): 2 mg via INTRAVENOUS
  Administered 2014-04-18 (×3): 1 mg via INTRAVENOUS
  Administered 2014-04-18: 2 mg via INTRAVENOUS
  Administered 2014-04-18: 1 mg via INTRAVENOUS
  Administered 2014-04-19 – 2014-04-21 (×29): 2 mg via INTRAVENOUS
  Filled 2014-04-15: qty 1
  Filled 2014-04-15: qty 2
  Filled 2014-04-15 (×2): qty 1
  Filled 2014-04-15 (×6): qty 2
  Filled 2014-04-15: qty 1
  Filled 2014-04-15 (×3): qty 2
  Filled 2014-04-15: qty 1
  Filled 2014-04-15 (×2): qty 2
  Filled 2014-04-15: qty 1
  Filled 2014-04-15: qty 2
  Filled 2014-04-15: qty 1
  Filled 2014-04-15: qty 2
  Filled 2014-04-15: qty 1
  Filled 2014-04-15 (×2): qty 2
  Filled 2014-04-15 (×2): qty 1
  Filled 2014-04-15 (×2): qty 2
  Filled 2014-04-15: qty 1
  Filled 2014-04-15: qty 2
  Filled 2014-04-15: qty 1
  Filled 2014-04-15: qty 2
  Filled 2014-04-15 (×2): qty 1
  Filled 2014-04-15 (×2): qty 2
  Filled 2014-04-15 (×3): qty 1
  Filled 2014-04-15 (×2): qty 2
  Filled 2014-04-15 (×2): qty 1
  Filled 2014-04-15 (×3): qty 2
  Filled 2014-04-15: qty 1
  Filled 2014-04-15: qty 2
  Filled 2014-04-15: qty 1
  Filled 2014-04-15 (×6): qty 2
  Filled 2014-04-15 (×2): qty 1
  Filled 2014-04-15: qty 2
  Filled 2014-04-15: qty 1
  Filled 2014-04-15: qty 2
  Filled 2014-04-15: qty 1
  Filled 2014-04-15: qty 2
  Filled 2014-04-15: qty 1
  Filled 2014-04-15 (×6): qty 2

## 2014-04-15 MED ORDER — SODIUM CHLORIDE 0.9 % IV SOLN: INTRAVENOUS | Status: DC

## 2014-04-15 MED ORDER — ALBUMIN HUMAN 5 % IV SOLN: 12.5000 g | Freq: Once | INTRAVENOUS | Status: DC

## 2014-04-15 MED ORDER — CETYLPYRIDINIUM CHLORIDE 0.05 % MT LIQD
7.0000 mL | Freq: Two times a day (BID) | OROMUCOSAL | Status: DC
  Administered 2014-04-15 – 2014-04-21 (×12): 7 mL via OROMUCOSAL

## 2014-04-15 MED ORDER — PHENYLEPHRINE HCL 10 MG/ML IJ SOLN: 10.0000 ug/min | INTRAVENOUS | Status: DC

## 2014-04-15 NOTE — Progress Notes (Signed)
75 mL of Fentanyl wasted in sink with Molli Hazard, RN. Richardean Sale, RN
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
## 2014-04-15 NOTE — Procedures (Signed)
Extubation Procedure Note  Patient Details:   Name: Tarry Fountain DOB: 1985/10/18 MRN: [id number]   Airway Documentation:     Evaluation  O2 sats: stable throughout and currently acceptable Complications: No apparent complications Patient did tolerate procedure well. Bilateral Breath Sounds: Clear, Diminished Suctioning: Oral, Airway Yes  Miquel Dunn 04/15/2014, 11:01 AM
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
## 2014-04-15 NOTE — Consult Note (Signed)
WOC wound consult note Reason for Consult: Consult requested to apply Vac dressing to abd wound. Wound type: Full thickness post-op wound Measurement: 25X3X3cm Wound bed: Beefy red Drainage (amount, consistency, odor) Small amt yellow drainage, no odor Periwound: Intact skin surrounding Dressing procedure/placement/frequency: Applied one piece of black foam to 140mm cont suction.  Pt tolerated with mod amt discomfort.  Plan for bedside nurse to change dressing Q Tues/Thurs/Sat.   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 Education provided:  Applied one piece pouch with barrier ring to maintain seal.  No family at bedside during pouch change and pt is intubated.  Will plan on performing further pouch change demonstrations when stable and out of ICU.  Supplies ordered to bedside for staff nurse use.  Educational materials left at bedside.    Julien Girt MSN, RN, [place], [place], [place]

## 2014-04-16 ENCOUNTER — Inpatient Hospital Stay (HOSPITAL_COMMUNITY): Payer: Self-pay

## 2014-04-16 DIAGNOSIS — D62 Acute posthemorrhagic anemia: Secondary | ICD-10-CM

## 2014-04-16 DIAGNOSIS — S32810B Multiple fractures of pelvis with stable disruption of pelvic ring, initial encounter for open fracture: Principal | ICD-10-CM

## 2014-04-16 DIAGNOSIS — W3400XA Accidental discharge from unspecified firearms or gun, initial encounter: Secondary | ICD-10-CM

## 2014-04-16 DIAGNOSIS — S36509S Unspecified injury of unspecified part of colon, sequela: Secondary | ICD-10-CM

## 2014-04-16 DIAGNOSIS — S3720XS Unspecified injury of bladder, sequela: Secondary | ICD-10-CM

## 2014-04-16 DIAGNOSIS — T148 Other injury of unspecified body region: Secondary | ICD-10-CM

## 2014-04-16 DIAGNOSIS — S32401B Unspecified fracture of right acetabulum, initial encounter for open fracture: Secondary | ICD-10-CM

## 2014-04-16 LAB — POCT I-STAT 7, (LYTES, BLD GAS, ICA,H+H)
Acid-Base Excess: 4 mmol/L — ABNORMAL HIGH (ref 0.0–2.0)
Acid-Base Excess: 2 mmol/L (ref 0.0–2.0)
BICARBONATE: 28.5 meq/L — AB (ref 20.0–24.0)
Bicarbonate: 27.5 meq/L — ABNORMAL HIGH (ref 20.0–24.0)
Calcium, Ion: 1.07 mmol/L — ABNORMAL LOW (ref 1.12–1.23)
Calcium, Ion: 1.03 mmol/L — ABNORMAL LOW (ref 1.12–1.23)
HCT: 20 % — ABNORMAL LOW (ref 39.0–52.0)
HEMATOCRIT: 28 % — AB (ref 39.0–52.0)
HEMOGLOBIN: 9.5 g/dL — AB (ref 13.0–17.0)
Hemoglobin: 6.8 g/dL — CL (ref 13.0–17.0)
O2 SAT: 100 %
O2 Saturation: 100 %
PCO2 ART: 42.4 mmHg (ref 35.0–45.0)
PH ART: 7.447 (ref 7.350–7.450)
PO2 ART: 533 mmHg — AB (ref 80.0–100.0)
POTASSIUM: 3.8 mmol/L (ref 3.5–5.1)
POTASSIUM: 4 mmol/L (ref 3.5–5.1)
Patient temperature: 36.5
SODIUM: 134 mmol/L — AB (ref 135–145)
Sodium: 133 mmol/L — ABNORMAL LOW (ref 135–145)
TCO2: 30 mmol/L (ref 0–100)
TCO2: 29 mmol/L (ref 0–100)
pCO2 arterial: 40.9 mmHg (ref 35.0–45.0)
pH, Arterial: 7.417 (ref 7.350–7.450)
pO2, Arterial: 486 mmHg — ABNORMAL HIGH (ref 80.0–100.0)

## 2014-04-16 LAB — BASIC METABOLIC PANEL
Anion gap: 6 (ref 5–15)
BUN: 6 mg/dL (ref 6–23)
CALCIUM: 7.7 mg/dL — AB (ref 8.4–10.5)
CO2: 28 mmol/L (ref 19–32)
Chloride: 107 mEq/L (ref 96–112)
Creatinine, Ser: 1.13 mg/dL (ref 0.50–1.35)
GFR calc Af Amer: >90 mL/min (ref >90)
GFR calc non Af Amer: 88 mL/min — ABNORMAL LOW (ref >90)
GLUCOSE: 145 mg/dL — AB (ref 70–99)
Potassium: 4 mmol/L (ref 3.5–5.1)
Sodium: 141 mmol/L (ref 135–145)

## 2014-04-16 LAB — CBC WITH DIFFERENTIAL/PLATELET
BASOS ABS: 0 10*3/uL (ref 0.0–0.1)
Basophils Relative: 0 % (ref 0–1)
Eosinophils Absolute: 0.2 10*3/uL (ref 0.0–0.7)
Eosinophils Relative: 2 % (ref 0–5)
HCT: 29.5 % — ABNORMAL LOW (ref 39.0–52.0)
Hemoglobin: 10 g/dL — ABNORMAL LOW (ref 13.0–17.0)
Lymphocytes Relative: 4 % — ABNORMAL LOW (ref 12–46)
Lymphs Abs: 0.5 10*3/uL — ABNORMAL LOW (ref 0.7–4.0)
MCH: 29.4 pg (ref 26.0–34.0)
MCHC: 33.9 g/dL (ref 30.0–36.0)
MCV: 86.8 fL (ref 78.0–100.0)
MONO ABS: 0.9 10*3/uL (ref 0.1–1.0)
MONOS PCT: 8 % (ref 3–12)
NEUTROS PCT: 85 % — AB (ref 43–77)
Neutro Abs: 9.2 10*3/uL — ABNORMAL HIGH (ref 1.7–7.7)
PLATELETS: 107 10*3/uL — AB (ref 150–400)
RBC: 3.4 MIL/uL — AB (ref 4.22–5.81)
RDW: 14.2 % (ref 11.5–15.5)
WBC: 10.8 10*3/uL — ABNORMAL HIGH (ref 4.0–10.5)

## 2014-04-16 LAB — BLOOD PRODUCT ORDER (VERBAL) VERIFICATION
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
## 2014-04-16 NOTE — Progress Notes (Signed)
Rehab Admissions Coordinator Note:  Patient was screened by Cleatrice Burke for appropriateness for an Inpatient Acute Rehab Consult per PT recommendation. At this time, we are recommending Inpatient Rehab consult.  Cleatrice Burke 04/16/2014, 1:24 PM  I can be reached at [phone]

## 2014-04-17 DIAGNOSIS — R571 Hypovolemic shock: Secondary | ICD-10-CM | POA: Diagnosis present

## 2014-04-17 DIAGNOSIS — S3282XA Multiple fractures of pelvis without disruption of pelvic ring, initial encounter for closed fracture: Secondary | ICD-10-CM | POA: Diagnosis present

## 2014-04-17 DIAGNOSIS — IMO0002 Reserved for concepts with insufficient information to code with codable children: Secondary | ICD-10-CM

## 2014-04-17 DIAGNOSIS — D62 Acute posthemorrhagic anemia: Secondary | ICD-10-CM | POA: Diagnosis not present

## 2014-04-17 DIAGNOSIS — S3720XA Unspecified injury of bladder, initial encounter: Secondary | ICD-10-CM | POA: Diagnosis present

## 2014-04-17 DIAGNOSIS — S62306A Unspecified fracture of fifth metacarpal bone, right hand, initial encounter for closed fracture: Secondary | ICD-10-CM | POA: Diagnosis present

## 2014-04-17 DIAGNOSIS — J9600 Acute respiratory failure, unspecified whether with hypoxia or hypercapnia: Secondary | ICD-10-CM | POA: Diagnosis present

## 2014-04-17 LAB — CBC WITH DIFFERENTIAL/PLATELET
Basophils Absolute: 0 10*3/uL (ref 0.0–0.1)
Basophils Relative: 0 % (ref 0–1)
Eosinophils Absolute: 0.1 10*3/uL (ref 0.0–0.7)
Eosinophils Relative: 2 % (ref 0–5)
HCT: 27 % — ABNORMAL LOW (ref 39.0–52.0)
Hemoglobin: 9.1 g/dL — ABNORMAL LOW (ref 13.0–17.0)
Lymphocytes Relative: 9 % — ABNORMAL LOW (ref 12–46)
Lymphs Abs: 0.8 10*3/uL (ref 0.7–4.0)
MCH: 28.6 pg (ref 26.0–34.0)
MCHC: 33.7 g/dL (ref 30.0–36.0)
MCV: 84.9 fL (ref 78.0–100.0)
Monocytes Absolute: 0.8 10*3/uL (ref 0.1–1.0)
Monocytes Relative: 10 % (ref 3–12)
Neutro Abs: 6.7 10*3/uL (ref 1.7–7.7)
Neutrophils Relative %: 80 % — ABNORMAL HIGH (ref 43–77)
Platelets: 144 10*3/uL — ABNORMAL LOW (ref 150–400)
RBC: 3.18 MIL/uL — AB (ref 4.22–5.81)
RDW: 14 % (ref 11.5–15.5)
WBC: 8.4 10*3/uL (ref 4.0–10.5)

## 2014-04-17 LAB — BASIC METABOLIC PANEL
Anion gap: 7 (ref 5–15)
BUN: 10 mg/dL (ref 6–23)
CO2: 26 mmol/L (ref 19–32)
CREATININE: 0.93 mg/dL (ref 0.50–1.35)
Calcium: 7.9 mg/dL — ABNORMAL LOW (ref 8.4–10.5)
Chloride: 107 mEq/L (ref 96–112)
GFR calc Af Amer: >90 mL/min (ref >90)
GFR calc non Af Amer: >90 mL/min (ref >90)
GLUCOSE: 135 mg/dL — AB (ref 70–99)
POTASSIUM: 3.5 mmol/L (ref 3.5–5.1)
Sodium: 140 mmol/L (ref 135–145)

## 2014-04-17 MED ORDER — BACITRACIN ZINC 500 UNIT/GM EX OINT
TOPICAL_OINTMENT | Freq: Two times a day (BID) | CUTANEOUS | Status: DC
  Administered 2014-04-17 – 2014-04-22 (×11): via TOPICAL
  Administered 2014-04-22: 1 via TOPICAL
  Administered 2014-04-23 – 2014-05-09 (×33): via TOPICAL
  Administered 2014-05-10: 1 via TOPICAL
  Administered 2014-05-10: 22:00:00 via TOPICAL
  Administered 2014-05-11: 1 via TOPICAL
  Administered 2014-05-11 – 2014-05-12 (×2): via TOPICAL
  Filled 2014-04-17 (×3): qty 28.35
  Filled 2014-04-17: qty 15
  Filled 2014-04-17: qty 28.35
  Filled 2014-04-17: qty 15
  Filled 2014-04-17 (×3): qty 28.35

## 2014-04-17 MED ORDER — ENOXAPARIN SODIUM 40 MG/0.4ML ~~LOC~~ SOLN
40.0000 mg | SUBCUTANEOUS | Status: DC
  Administered 2014-04-17 – 2014-04-27 (×11): 40 mg via SUBCUTANEOUS
  Filled 2014-04-17 (×15): qty 0.4
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
## 2014-04-17 NOTE — Progress Notes (Signed)
UR completed.  Rubena Roseman, RN BSN [facility] CCM Trauma/Neuro ICU Case Manager [phone]  

## 2014-04-17 NOTE — Progress Notes (Signed)
Pt currently not at a level to be able to tolerate intense inpt rehab program. We will follow up next week. [phone]
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
## 2014-04-17 NOTE — Consult Note (Signed)
WOC wound consult note Reason for Consult: Vac dressing change to midline abd Wound type: Full thickness post-op wound Wound bed: Beefy red Drainage (amount, consistency, odor) Small amt yellow drainage in cannister, no odor Periwound: Intact skin surrounding Dressing procedure/placement/frequency: Applied one piece of black foam to 179mm cont suction. Pt tolerated with mod amt discomfort. Plan to change on Mon with ostomy pouch teaching session. WOC ostomy consult note Stoma type/location: Colostomy stoma from 12/21 intact to LLQ Stomal assessment/size: Stoma red and viable, flush with skin level 1 3/4 inches Peristomal assessment: Intact skin surrounding Output 100 cc pink drainage in pouch, no stool or flatus Ostomy pouching: 1pc. Education provided: Applied one piece pouch with barrier ring to maintain seal. No family at bedside during pouch change; pt watched procedure but did not ask questions or participate. Will plan on performing further pouch change demonstrations when stable and out of ICU. Supplies ordered to bedside for staff nurse use. Educational materials left at bedside.   Julien Girt MSN, RN, [place], [place], [place]
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
## 2014-04-18 LAB — CBC
HCT: 27.4 % — ABNORMAL LOW (ref 39.0–52.0)
Hemoglobin: 9.2 g/dL — ABNORMAL LOW (ref 13.0–17.0)
MCH: 28.5 pg (ref 26.0–34.0)
MCHC: 33.6 g/dL (ref 30.0–36.0)
MCV: 84.8 fL (ref 78.0–100.0)
PLATELETS: 220 10*3/uL (ref 150–400)
RBC: 3.23 MIL/uL — ABNORMAL LOW (ref 4.22–5.81)
RDW: 14.1 % (ref 11.5–15.5)
WBC: 9.8 10*3/uL (ref 4.0–10.5)

## 2014-04-18 NOTE — Plan of Care (Signed)
Problem: Phase II Progression Outcomes Goal: Pain controlled Outcome: Progressing Better control with Dilaudid 2 mg.

## 2014-04-18 NOTE — Plan of Care (Signed)
Problem: Phase I Progression Outcomes Goal: Tubes/drains patent Outcome: Progressing Carlos Lawrence emptied often today- 380cc total output.
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
## 2014-04-18 NOTE — Plan of Care (Signed)
Problem: Phase I Progression Outcomes Goal: OOB as tolerated unless otherwise ordered Outcome: Progressing Up in recliner and on side of bed today

## 2014-04-18 NOTE — Plan of Care (Signed)
Problem: Phase I Progression Outcomes Goal: Incision/dressings dry and intact Outcome: Progressing Dressings changed.

## 2014-04-18 NOTE — Plan of Care (Signed)
Problem: Phase I Progression Outcomes Goal: Sutures/staples intact Outcome: Completed/Met Date Met:  04/18/14 Incision lines clean and approximated.

## 2014-04-19 LAB — BASIC METABOLIC PANEL
ANION GAP: 6 (ref 5–15)
BUN: 12 mg/dL (ref 6–23)
CO2: 24 mmol/L (ref 19–32)
CREATININE: 1.14 mg/dL (ref 0.50–1.35)
Calcium: 7.9 mg/dL — ABNORMAL LOW (ref 8.4–10.5)
Chloride: 106 mEq/L (ref 96–112)
GFR calc Af Amer: >90 mL/min (ref >90)
GFR calc non Af Amer: 87 mL/min — ABNORMAL LOW (ref >90)
GLUCOSE: 119 mg/dL — AB (ref 70–99)
POTASSIUM: 3.7 mmol/L (ref 3.5–5.1)
Sodium: 136 mmol/L (ref 135–145)

## 2014-04-19 LAB — CBC
HEMATOCRIT: 27.4 % — AB (ref 39.0–52.0)
Hemoglobin: 9.1 g/dL — ABNORMAL LOW (ref 13.0–17.0)
MCH: 28.3 pg (ref 26.0–34.0)
MCHC: 33.2 g/dL (ref 30.0–36.0)
MCV: 85.4 fL (ref 78.0–100.0)
Platelets: 261 10*3/uL (ref 150–400)
RBC: 3.21 MIL/uL — ABNORMAL LOW (ref 4.22–5.81)
RDW: 14.1 % (ref 11.5–15.5)
WBC: 12.5 10*3/uL — AB (ref 4.0–10.5)

## 2014-04-19 LAB — CREATININE, FLUID (PLEURAL, PERITONEAL, JP DRAINAGE): Creat, Fluid: 63.8 mg/dL
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
## 2014-04-19 NOTE — Progress Notes (Signed)
5 Days Post-Op Subjective: Patient reports no new issues/ complaints.  Objective: Vital signs in last 24 hours: Temp:  [98.5 F (36.9 C)-99.9 F (37.7 C)] 98.6 F (37 C) (12/26 0612) Pulse Rate:  [88-94] 88 (12/26 0612) Resp:  [18-19] 18 (12/26 0612) BP: (140-167)/(89-103) 167/98 mmHg (12/26 0612) SpO2:  [92 %-95 %] 92 % (12/26 0612)  Intake/Output from previous day: 12/25 0701 - 12/26 0700 In: 2022 [P.O.:470; I.V.:1552] Out: 2040 [Urine:1500; Drains:540] Intake/Output this shift: Total I/O In: -  Out: 80 [Drains:80]  Physical Exam:  Constitutional: Vital signs reviewed. WD WN in NAD   Eyes: PERRL, No scleral icterus.   Cardiovascular: RRR Pulmonary/Chest: Normal effort Abdominal: Soft. Non-tender, non-distended, bowel sounds are normal, no masses, organomegaly, or guarding present.  Genitourinary: SP and Foley catheter draining appropriately Extremities: No cyanosis or edema   Lab Results:  Recent Labs  04/17/14 0236 04/18/14 0755 04/19/14 0430  HGB 9.1* 9.2* 9.1*  HCT 27.0* 27.4* 27.4*   BMET  Recent Labs  04/17/14 0236 04/19/14 0430  NA 140 136  K 3.5 3.7  CL 107 106  CO2 26 24  GLUCOSE 135* 119*  BUN 10 12  CREATININE 0.93 1.14  CALCIUM 7.9* 7.9*   No results for input(s): LABPT, INR in the last 72 hours. No results for input(s): LABURIN in the last 72 hours. Results for orders placed or performed during the hospital encounter of 04/11/14  MRSA PCR Screening     Status: None   Collection Time: 04/12/14  2:27 AM  Result Value Ref Range Status   MRSA by PCR NEGATIVE NEGATIVE Final    Comment:        The GeneXpert MRSA Assay (FDA approved for NASAL specimens only), is one component of a comprehensive MRSA colonization surveillance program. It is not intended to diagnose MRSA infection nor to guide or monitor treatment for MRSA infections.     Studies/Results: No results found.  Assessment/Plan:   No significant clinical change. Both  catheters appear to be draining well. JP drainage appears moderate. We'll go ahead and send JP drainage for creatinine level.   LOS: 8 days   Jaycob Mcclenton S 04/19/2014, 10:20 AM

## 2014-04-20 MED ORDER — FENTANYL 50 MCG/HR TD PT72
50.0000 ug | MEDICATED_PATCH | TRANSDERMAL | Status: DC
Start: 2014-04-20 — End: 2014-04-22
  Administered 2014-04-20: 50 ug via TRANSDERMAL
  Filled 2014-04-20: qty 1
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
## 2014-04-20 NOTE — Progress Notes (Signed)
Patient ID: Carlos Lawrence, male   DOB: 06-24-1985, 28 y.o.   MRN: [id number]   No significant clinical change. Serum creatinine remains within normal limits. JP output remains elevated. JP creatinine consistent with urine   Given the extent of bladder injury as it would not be unusual to have some leakage. Obviously consideration has to be given for a unrecognized ureteral injury. Patient may require additional CT imaging with contrast as well as contrast study through the suprapubic tube/Foley catheter. His would be able to better determine if the leakage is coming from the bladder repair or again and unrecognized ureteral injury. Will discuss with his attending urologist for consideration for early next week. No need for any urgent change in management.

## 2014-04-21 LAB — BASIC METABOLIC PANEL
ANION GAP: 7 (ref 5–15)
BUN: 9 mg/dL (ref 6–23)
CHLORIDE: 102 meq/L (ref 96–112)
CO2: 21 mmol/L (ref 19–32)
CREATININE: 0.71 mg/dL (ref 0.50–1.35)
Calcium: 7.9 mg/dL — ABNORMAL LOW (ref 8.4–10.5)
Glucose, Bld: 141 mg/dL — ABNORMAL HIGH (ref 70–99)
Potassium: 4.2 mmol/L (ref 3.5–5.1)
SODIUM: 130 mmol/L — AB (ref 135–145)

## 2014-04-21 LAB — CBC
HCT: 27.1 % — ABNORMAL LOW (ref 39.0–52.0)
Hemoglobin: 9.1 g/dL — ABNORMAL LOW (ref 13.0–17.0)
MCH: 29 pg (ref 26.0–34.0)
MCHC: 33.6 g/dL (ref 30.0–36.0)
MCV: 86.3 fL (ref 78.0–100.0)
Platelets: 294 10*3/uL (ref 150–400)
RBC: 3.14 MIL/uL — ABNORMAL LOW (ref 4.22–5.81)
RDW: 14.2 % (ref 11.5–15.5)
WBC: 18.2 10*3/uL — AB (ref 4.0–10.5)

## 2014-04-21 MED ORDER — HYDROMORPHONE HCL 1 MG/ML IJ SOLN
1.0000 mg | INTRAMUSCULAR | Status: DC | PRN
  Administered 2014-04-21 – 2014-04-26 (×27): 1 mg via INTRAVENOUS
  Filled 2014-04-21 (×30): qty 1

## 2014-04-21 MED ORDER — DOCUSATE SODIUM 100 MG PO CAPS
100.0000 mg | ORAL_CAPSULE | Freq: Two times a day (BID) | ORAL | Status: DC
  Administered 2014-04-21 – 2014-04-30 (×19): 100 mg via ORAL
  Filled 2014-04-21 (×19): qty 1

## 2014-04-21 MED ORDER — SODIUM CHLORIDE 1 G PO TABS
1.0000 g | ORAL_TABLET | Freq: Three times a day (TID) | ORAL | Status: DC
  Administered 2014-04-21 – 2014-04-25 (×9): 1 g via ORAL
  Filled 2014-04-21 (×15): qty 1

## 2014-04-21 MED ORDER — ENSURE COMPLETE PO LIQD
237.0000 mL | Freq: Two times a day (BID) | ORAL | Status: DC
  Administered 2014-04-21 – 2014-05-11 (×23): 237 mL via ORAL

## 2014-04-21 MED ORDER — POLYETHYLENE GLYCOL 3350 17 G PO PACK
17.0000 g | PACK | Freq: Every day | ORAL | Status: DC
  Administered 2014-04-21 – 2014-05-08 (×10): 17 g via ORAL
  Filled 2014-04-21 (×26): qty 1

## 2014-04-21 MED ORDER — OXYCODONE HCL 5 MG PO TABS
10.0000 mg | ORAL_TABLET | ORAL | Status: DC | PRN
  Administered 2014-04-21 (×4): 20 mg via ORAL
  Administered 2014-04-22 (×4): 10 mg via ORAL
  Administered 2014-04-22: 15 mg via ORAL
  Administered 2014-04-23 (×4): 10 mg via ORAL
  Administered 2014-04-24: 20 mg via ORAL
  Administered 2014-04-24 (×4): 10 mg via ORAL
  Administered 2014-04-25: 15 mg via ORAL
  Administered 2014-04-25: 10 mg via ORAL
  Administered 2014-04-25 (×2): 15 mg via ORAL
  Administered 2014-04-25: 10 mg via ORAL
  Administered 2014-04-26: 15 mg via ORAL
  Administered 2014-04-26 – 2014-04-28 (×11): 20 mg via ORAL
  Filled 2014-04-21: qty 4
  Filled 2014-04-21 (×2): qty 2
  Filled 2014-04-21: qty 4
  Filled 2014-04-21: qty 2
  Filled 2014-04-21: qty 3
  Filled 2014-04-21: qty 2
  Filled 2014-04-21 (×3): qty 4
  Filled 2014-04-21: qty 2
  Filled 2014-04-21: qty 4
  Filled 2014-04-21 (×2): qty 3
  Filled 2014-04-21: qty 4
  Filled 2014-04-21: qty 2
  Filled 2014-04-21: qty 3
  Filled 2014-04-21: qty 2
  Filled 2014-04-21 (×4): qty 4
  Filled 2014-04-21: qty 2
  Filled 2014-04-21 (×4): qty 4
  Filled 2014-04-21 (×2): qty 2
  Filled 2014-04-21: qty 4
  Filled 2014-04-21: qty 2
  Filled 2014-04-21: qty 4
  Filled 2014-04-21 (×2): qty 2
  Filled 2014-04-21: qty 4

## 2014-04-21 NOTE — Progress Notes (Signed)
Rehab admissions - Evaluated for possible admission.  I met with patient.  He lives with his fiance and 3 children.  His mother was at the bedside.  Mom tells me that she, her sister, and patient's brothers can rotate care after inpatient rehab stay.  I will follow along and await medical readiness for inpatient rehab admission.  Call me for questions.  #[phone]
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
## 2014-04-21 NOTE — Progress Notes (Signed)
PT Cancellation Note  Patient Details Name: Carlos Lawrence MRN: [id number] DOB: 01/23/1986   Cancelled Treatment:    Reason Eval/Treat Not Completed: Pain limiting ability to participate;Fatigue/lethargy limiting ability to participate.  Attempted to see patient x2.  First had just finished working with OT and was going to eat.  On second attempt, RN has assisted patient back to bed.  Patient reports too much pain and fatigue. Declined PT.  Will return in am.   Despina Pole 04/21/2014, 3:02 PM Carita Pian. Sanjuana Kava, [place] Pager [phone]

## 2014-04-21 NOTE — Progress Notes (Signed)
Reviewed chart It appears that JP drain total output is low- unlikely a ureteral injury but still possible Serum Cr Ok but mild bump recent and recheck today S/p tube draining re 700 ml per shift It is OK that foley is not draining well No reported flank pain Will review labs again today and consider Ct scan (no need for cystogram yet) to look for hydro and ureteral injury
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
## 2014-04-21 NOTE — Consult Note (Addendum)
WOC ostomy follow up Stoma type/location:  Colostomy stoma from 12/21 to LLQ Stomal assessment/size:  Red and viable, 11/2 inches, flush with skin level Peristomal assessment:  Intact skin surrounding Output  50cc brown drainage, no stool or flatus at this time Ostomy pouching: 1pc. Education provided:  Demonstrated pouch change using barrier ring and one piece pouch.  Pt watched procedure and asked appropriate questions.  He is able to open and close velcro to empty.  Supplies at bedside for staff nurses.  Educational materials left at bedside, no family members present for teaching session.   Enrolled patient in [place] Start Discharge program: Yes  WOC wound follow-up consult note Reason for Consult: Vac changed and trauma PA in to assess wound and provide orders. Wound type: Full thickness post-op abd wound Measurement:25X3X3cm Wound bed: Beefy red with granulation visible. Drainage (amount, consistency, odor)  Small amt pink drainage, no odor Periwound: Intact skin surrounding Dressing procedure/placement/frequency: Vac discontinued and moist gauze packing applied; bedside nurse to change BID. Julien Girt MSN, RN, [place], [place], [place]
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
## 2014-04-22 ENCOUNTER — Inpatient Hospital Stay (HOSPITAL_COMMUNITY): Payer: Self-pay

## 2014-04-22 ENCOUNTER — Encounter (HOSPITAL_COMMUNITY): Payer: Self-pay | Admitting: *Deleted

## 2014-04-22 LAB — BASIC METABOLIC PANEL
Anion gap: 9 (ref 5–15)
BUN: 10 mg/dL (ref 6–23)
CALCIUM: 8.1 mg/dL — AB (ref 8.4–10.5)
CO2: 23 mmol/L (ref 19–32)
Chloride: 96 mEq/L (ref 96–112)
Creatinine, Ser: 0.7 mg/dL (ref 0.50–1.35)
GFR calc Af Amer: >90 mL/min (ref >90)
GFR calc non Af Amer: >90 mL/min (ref >90)
GLUCOSE: 135 mg/dL — AB (ref 70–99)
Potassium: 3.7 mmol/L (ref 3.5–5.1)
SODIUM: 128 mmol/L — AB (ref 135–145)

## 2014-04-22 LAB — CBC
HCT: 27.1 % — ABNORMAL LOW (ref 39.0–52.0)
Hemoglobin: 9.2 g/dL — ABNORMAL LOW (ref 13.0–17.0)
MCH: 28.8 pg (ref 26.0–34.0)
MCHC: 33.9 g/dL (ref 30.0–36.0)
MCV: 84.7 fL (ref 78.0–100.0)
Platelets: 317 10*3/uL (ref 150–400)
RBC: 3.2 MIL/uL — ABNORMAL LOW (ref 4.22–5.81)
RDW: 14 % (ref 11.5–15.5)
WBC: 18.2 10*3/uL — AB (ref 4.0–10.5)

## 2014-04-22 MED ORDER — TRAMADOL HCL 50 MG PO TABS
100.0000 mg | ORAL_TABLET | Freq: Four times a day (QID) | ORAL | Status: DC
  Administered 2014-04-22: 100 mg via ORAL
  Administered 2014-04-22: 50 mg via ORAL
  Administered 2014-04-23 – 2014-04-28 (×14): 100 mg via ORAL
  Filled 2014-04-22 (×18): qty 2

## 2014-04-22 MED ORDER — IOHEXOL 300 MG/ML  SOLN
25.0000 mL | INTRAMUSCULAR | Status: AC
  Administered 2014-04-22 (×2): 25 mL via ORAL

## 2014-04-22 MED ORDER — FENTANYL 50 MCG/HR TD PT72
100.0000 ug | MEDICATED_PATCH | TRANSDERMAL | Status: DC
  Administered 2014-04-22: 100 ug via TRANSDERMAL
  Filled 2014-04-22: qty 2

## 2014-04-22 MED ORDER — IOHEXOL 300 MG/ML  SOLN
100.0000 mL | Freq: Once | INTRAMUSCULAR | Status: AC | PRN
  Administered 2014-04-22: 100 mL via INTRAVENOUS

## 2014-04-22 MED ORDER — SODIUM CHLORIDE 0.9 % IV SOLN
INTRAVENOUS | Status: DC
  Administered 2014-04-22 (×2): via INTRAVENOUS
  Filled 2014-04-22 (×2): qty 1000

## 2014-04-22 MED ORDER — HYDROMORPHONE HCL 1 MG/ML IJ SOLN
2.0000 mg | Freq: Once | INTRAMUSCULAR | Status: AC
  Administered 2014-04-22: 2 mg via INTRAVENOUS
  Filled 2014-04-22: qty 2
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
## 2014-04-22 NOTE — Progress Notes (Signed)
Pt still complaining of severe surgical pain despite dilaudid 1 mg IV given at 0313 and 10 mg of Oxycodone, Md on call notified with new order received.
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
## 2014-04-22 NOTE — Progress Notes (Signed)
Rehab admissions - Not medically ready today for acute inpatient rehab admission.  Will follow up again tomorrow.  Call me for questions.  #[phone]

## 2014-04-22 NOTE — Progress Notes (Signed)
Patient ID: Carlos Lawrence, male   DOB: [birth year], 28 y.o.   MRN: [id number] Patient seen and examined Dressing changed at bedside No signs of infection or vascular compromise Wounds cleaned Discussed all right hand issues with him Will begin daily dressing changes at bedside by NS(nursing staff) Plan to check xrays in a week [name] MD
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
## 2014-04-22 NOTE — Progress Notes (Signed)
Agree with Ct scan to assess fever Hopefully they can rule out urinoma or left ueteral injury Drain output very reasonable under the circumstances and output good thru s/p and foley Will follow

## 2014-04-23 ENCOUNTER — Encounter (HOSPITAL_COMMUNITY): Payer: Self-pay

## 2014-04-23 DIAGNOSIS — M7989 Other specified soft tissue disorders: Secondary | ICD-10-CM

## 2014-04-23 DIAGNOSIS — E871 Hypo-osmolality and hyponatremia: Secondary | ICD-10-CM | POA: Diagnosis not present

## 2014-04-23 LAB — CBC
HEMATOCRIT: 27.5 % — AB (ref 39.0–52.0)
Hemoglobin: 9.3 g/dL — ABNORMAL LOW (ref 13.0–17.0)
MCH: 28.4 pg (ref 26.0–34.0)
MCHC: 33.8 g/dL (ref 30.0–36.0)
MCV: 84.1 fL (ref 78.0–100.0)
PLATELETS: 365 10*3/uL (ref 150–400)
RBC: 3.27 MIL/uL — AB (ref 4.22–5.81)
RDW: 13.6 % (ref 11.5–15.5)
WBC: 17.8 10*3/uL — ABNORMAL HIGH (ref 4.0–10.5)

## 2014-04-23 LAB — BASIC METABOLIC PANEL
Anion gap: 7 (ref 5–15)
BUN: 9 mg/dL (ref 6–23)
CO2: 23 mmol/L (ref 19–32)
Calcium: 7.9 mg/dL — ABNORMAL LOW (ref 8.4–10.5)
Chloride: 98 mEq/L (ref 96–112)
Creatinine, Ser: 0.76 mg/dL (ref 0.50–1.35)
GFR calc Af Amer: >90 mL/min (ref >90)
GFR calc non Af Amer: >90 mL/min (ref >90)
GLUCOSE: 118 mg/dL — AB (ref 70–99)
POTASSIUM: 4.4 mmol/L (ref 3.5–5.1)
Sodium: 128 mmol/L — ABNORMAL LOW (ref 135–145)

## 2014-04-23 MED ORDER — FLEET ENEMA 7-19 GM/118ML RE ENEM
1.0000 | ENEMA | Freq: Every day | RECTAL | Status: DC
  Administered 2014-04-25: 1 via RECTAL
  Filled 2014-04-23 (×9): qty 1

## 2014-04-23 NOTE — Progress Notes (Signed)
Rehab admissions - I spoke with trauma PA this am.  Patient not ready for acute inpatient rehab today.  Call me for questions.  #[phone]
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
## 2014-04-23 NOTE — Progress Notes (Signed)
VASCULAR LAB PRELIMINARY  PRELIMINARY  PRELIMINARY  PRELIMINARY  Bilateral lower extremity venous duplex  completed.    Preliminary report: The right common femoral vein was not compressible.  The patient was very guarding and quite tense with compression. The pulse Doppler and color flow Doppler were normal in the area. Unable to completely clear this area DVT free.  All other veins were normal bilaterally with out evidence of deep vein thrombosis.Marland Kitchen  Artemus Romanoff, RVT 04/23/2014, 3:20 PM
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
## 2014-04-23 NOTE — Progress Notes (Signed)
PT Cancellation Note  Patient Details Name: Carlos Lawrence MRN: [id number] DOB: 11-15-1985   Cancelled Treatment:    Reason Eval/Treat Not Completed: Patient at procedure or test/unavailable. Pt off floor. Will re-attempt to see pt at next available time.    Elie Confer [place], [place] 04/23/2014, 2:45 PM

## 2014-04-24 ENCOUNTER — Inpatient Hospital Stay (HOSPITAL_COMMUNITY): Payer: Self-pay

## 2014-04-24 LAB — BASIC METABOLIC PANEL
Anion gap: 8 (ref 5–15)
BUN: 8 mg/dL (ref 6–23)
CALCIUM: 7.9 mg/dL — AB (ref 8.4–10.5)
CO2: 23 mmol/L (ref 19–32)
Chloride: 94 mEq/L — ABNORMAL LOW (ref 96–112)
Creatinine, Ser: 0.74 mg/dL (ref 0.50–1.35)
GFR calc non Af Amer: >90 mL/min (ref >90)
Glucose, Bld: 138 mg/dL — ABNORMAL HIGH (ref 70–99)
POTASSIUM: 3.9 mmol/L (ref 3.5–5.1)
SODIUM: 125 mmol/L — AB (ref 135–145)

## 2014-04-24 LAB — CBC
HCT: 26 % — ABNORMAL LOW (ref 39.0–52.0)
Hemoglobin: 8.8 g/dL — ABNORMAL LOW (ref 13.0–17.0)
MCH: 28.1 pg (ref 26.0–34.0)
MCHC: 33.8 g/dL (ref 30.0–36.0)
MCV: 83.1 fL (ref 78.0–100.0)
Platelets: 357 10*3/uL (ref 150–400)
RBC: 3.13 MIL/uL — AB (ref 4.22–5.81)
RDW: 13.8 % (ref 11.5–15.5)
WBC: 13.1 10*3/uL — ABNORMAL HIGH (ref 4.0–10.5)

## 2014-04-24 MED ORDER — ATROPINE SULFATE 0.1 MG/ML IJ SOLN
0.5000 mg | INTRAMUSCULAR | Status: DC | PRN
  Filled 2014-04-24: qty 10

## 2014-04-24 MED ORDER — NAPROXEN 500 MG PO TABS
500.0000 mg | ORAL_TABLET | Freq: Two times a day (BID) | ORAL | Status: DC
  Administered 2014-04-24: 500 mg via ORAL
  Filled 2014-04-24 (×3): qty 1
  Filled 2014-04-24: qty 2
  Filled 2014-04-24 (×5): qty 1
  Filled 2014-04-24: qty 2
  Filled 2014-04-24: qty 1

## 2014-04-24 MED ORDER — FENTANYL 50 MCG/HR TD PT72
100.0000 ug | MEDICATED_PATCH | TRANSDERMAL | Status: DC
  Administered 2014-04-24: 100 ug via TRANSDERMAL
  Filled 2014-04-24: qty 2

## 2014-04-24 MED ORDER — NEOSTIGMINE METHYLSULFATE 10 MG/10ML IV SOLN
2.0000 mg | Freq: Once | INTRAVENOUS | Status: AC
  Administered 2014-04-24: 2 mg via INTRAVENOUS
  Filled 2014-04-24 (×2): qty 2

## 2014-04-24 MED ORDER — FENTANYL 50 MCG/HR TD PT72
150.0000 ug | MEDICATED_PATCH | TRANSDERMAL | Status: DC
  Administered 2014-04-27: 150 ug via TRANSDERMAL
  Filled 2014-04-24: qty 3

## 2014-04-24 MED ORDER — NAPROXEN 500 MG PO TABS
500.0000 mg | ORAL_TABLET | Freq: Two times a day (BID) | ORAL | Status: DC
  Filled 2014-04-24 (×2): qty 1
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
## 2014-04-25 MED ORDER — SODIUM CHLORIDE 0.9 % IV SOLN
INTRAVENOUS | Status: DC
  Administered 2014-04-25 – 2014-04-27 (×5): via INTRAVENOUS
  Administered 2014-04-28: 1 mL via INTRAVENOUS
  Administered 2014-04-28 – 2014-05-02 (×5): via INTRAVENOUS

## 2014-04-25 MED ORDER — SODIUM CHLORIDE 1 G PO TABS
2.0000 g | ORAL_TABLET | Freq: Three times a day (TID) | ORAL | Status: DC
  Administered 2014-04-25 – 2014-05-04 (×25): 2 g via ORAL
  Administered 2014-05-05: 1 g via ORAL
  Administered 2014-05-05: 2 g via ORAL
  Administered 2014-05-06: 1 g via ORAL
  Administered 2014-05-07 – 2014-05-09 (×7): 2 g via ORAL
  Administered 2014-05-09: 1 g via ORAL
  Administered 2014-05-09 – 2014-05-13 (×7): 2 g via ORAL
  Filled 2014-04-25 (×59): qty 2
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
## 2014-04-26 ENCOUNTER — Inpatient Hospital Stay (HOSPITAL_COMMUNITY): Payer: Self-pay

## 2014-04-26 LAB — BASIC METABOLIC PANEL
Anion gap: 10 (ref 5–15)
BUN: <5 mg/dL — ABNORMAL LOW (ref 6–23)
CALCIUM: 7.9 mg/dL — AB (ref 8.4–10.5)
CHLORIDE: 94 meq/L — AB (ref 96–112)
CO2: 22 mmol/L (ref 19–32)
CREATININE: 0.72 mg/dL (ref 0.50–1.35)
GFR calc non Af Amer: >90 mL/min (ref >90)
GLUCOSE: 147 mg/dL — AB (ref 70–99)
Potassium: 4.1 mmol/L (ref 3.5–5.1)
Sodium: 126 mmol/L — ABNORMAL LOW (ref 135–145)

## 2014-04-26 LAB — CBC
HCT: 23.4 % — ABNORMAL LOW (ref 39.0–52.0)
Hemoglobin: 8.1 g/dL — ABNORMAL LOW (ref 13.0–17.0)
MCH: 29.1 pg (ref 26.0–34.0)
MCHC: 34.6 g/dL (ref 30.0–36.0)
MCV: 84.2 fL (ref 78.0–100.0)
PLATELETS: 469 10*3/uL — AB (ref 150–400)
RBC: 2.78 MIL/uL — ABNORMAL LOW (ref 4.22–5.81)
RDW: 14 % (ref 11.5–15.5)
WBC: 13.3 10*3/uL — ABNORMAL HIGH (ref 4.0–10.5)

## 2014-04-26 LAB — CREATININE, FLUID (PLEURAL, PERITONEAL, JP DRAINAGE): Creat, Fluid: 48.8 mg/dL

## 2014-04-26 MED ORDER — HYDROMORPHONE HCL 1 MG/ML IJ SOLN
1.0000 mg | INTRAMUSCULAR | Status: DC | PRN
  Administered 2014-04-26 – 2014-04-28 (×19): 1 mg via INTRAVENOUS
  Filled 2014-04-26 (×19): qty 1

## 2014-04-26 MED ORDER — HYDROMORPHONE HCL 1 MG/ML IJ SOLN
1.0000 mg | Freq: Once | INTRAMUSCULAR | Status: AC
  Administered 2014-04-26: 1 mg via INTRAVENOUS
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
## 2014-04-26 NOTE — Progress Notes (Signed)
Pt assisted back to the bed from the chair for portable hip xray.

## 2014-04-26 NOTE — Progress Notes (Signed)
Pt c/o severe pain in the R hip area.  Dr. Grandville Silos informed.

## 2014-04-26 NOTE — Progress Notes (Signed)
Patient ID: Carlos Lawrence, male   DOB: 06-25-1985, 29 y.o.   MRN: [id number] Hand is reviewed and examined Patient remains stable and improving I discussed with the patient and Nursing staff the plans and the recs of daily dressing changes and close observation Overall stable Prestina Raigoza MD
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
## 2014-04-27 ENCOUNTER — Encounter (HOSPITAL_COMMUNITY): Payer: Self-pay | Admitting: Radiology

## 2014-04-27 ENCOUNTER — Inpatient Hospital Stay (HOSPITAL_COMMUNITY): Payer: Self-pay

## 2014-04-27 LAB — OSMOLALITY, URINE: OSMOLALITY UR: 799 mosm/kg (ref 390–1090)

## 2014-04-27 LAB — BASIC METABOLIC PANEL
Anion gap: 8 (ref 5–15)
CHLORIDE: 98 meq/L (ref 96–112)
CO2: 23 mmol/L (ref 19–32)
Calcium: 7.9 mg/dL — ABNORMAL LOW (ref 8.4–10.5)
Creatinine, Ser: 0.65 mg/dL (ref 0.50–1.35)
GFR calc Af Amer: >90 mL/min (ref >90)
Glucose, Bld: 114 mg/dL — ABNORMAL HIGH (ref 70–99)
Potassium: 3.8 mmol/L (ref 3.5–5.1)
Sodium: 129 mmol/L — ABNORMAL LOW (ref 135–145)

## 2014-04-27 LAB — URINE CULTURE: SPECIAL REQUESTS: NORMAL

## 2014-04-27 LAB — CBC
HEMATOCRIT: 24.4 % — AB (ref 39.0–52.0)
HEMOGLOBIN: 8.4 g/dL — AB (ref 13.0–17.0)
MCH: 29.1 pg (ref 26.0–34.0)
MCHC: 34.4 g/dL (ref 30.0–36.0)
MCV: 84.4 fL (ref 78.0–100.0)
Platelets: 482 10*3/uL — ABNORMAL HIGH (ref 150–400)
RBC: 2.89 MIL/uL — ABNORMAL LOW (ref 4.22–5.81)
RDW: 14.2 % (ref 11.5–15.5)
WBC: 10.7 10*3/uL — ABNORMAL HIGH (ref 4.0–10.5)

## 2014-04-27 LAB — OSMOLALITY: Osmolality: 261 mOsm/kg — ABNORMAL LOW (ref 275–300)

## 2014-04-27 MED ORDER — METHOCARBAMOL 500 MG PO TABS
500.0000 mg | ORAL_TABLET | Freq: Four times a day (QID) | ORAL | Status: DC | PRN
  Administered 2014-04-27 – 2014-05-01 (×13): 500 mg via ORAL
  Filled 2014-04-27 (×13): qty 1

## 2014-04-27 MED ORDER — SODIUM CHLORIDE 0.9 % IV SOLN
12.5000 mg | Freq: Four times a day (QID) | INTRAVENOUS | Status: DC | PRN
  Administered 2014-04-27 – 2014-04-28 (×3): 12.5 mg via INTRAVENOUS
  Filled 2014-04-27 (×5): qty 0.5

## 2014-04-27 MED ORDER — IOHEXOL 300 MG/ML  SOLN
100.0000 mL | Freq: Once | INTRAMUSCULAR | Status: AC | PRN
  Administered 2014-04-27: 100 mL via INTRAVENOUS
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
## 2014-04-28 DIAGNOSIS — I82419 Acute embolism and thrombosis of unspecified femoral vein: Secondary | ICD-10-CM | POA: Diagnosis not present

## 2014-04-28 LAB — COMPREHENSIVE METABOLIC PANEL
ALBUMIN: 2 g/dL — AB (ref 3.5–5.2)
ALT: 21 U/L (ref 0–53)
ANION GAP: 10 (ref 5–15)
AST: 22 U/L (ref 0–37)
Alkaline Phosphatase: 145 U/L — ABNORMAL HIGH (ref 39–117)
BILIRUBIN TOTAL: 0.7 mg/dL (ref 0.3–1.2)
BUN: <5 mg/dL — ABNORMAL LOW (ref 6–23)
CHLORIDE: 97 meq/L (ref 96–112)
CO2: 22 mmol/L (ref 19–32)
CREATININE: 0.68 mg/dL (ref 0.50–1.35)
Calcium: 7.9 mg/dL — ABNORMAL LOW (ref 8.4–10.5)
GFR calc Af Amer: >90 mL/min (ref >90)
Glucose, Bld: 118 mg/dL — ABNORMAL HIGH (ref 70–99)
Potassium: 3.9 mmol/L (ref 3.5–5.1)
Sodium: 129 mmol/L — ABNORMAL LOW (ref 135–145)
Total Protein: 6.4 g/dL (ref 6.0–8.3)

## 2014-04-28 LAB — GLUCOSE, CAPILLARY
GLUCOSE-CAPILLARY: 133 mg/dL — AB (ref 70–99)
Glucose-Capillary: 131 mg/dL — ABNORMAL HIGH (ref 70–99)
Glucose-Capillary: 148 mg/dL — ABNORMAL HIGH (ref 70–99)

## 2014-04-28 MED ORDER — INSULIN ASPART 100 UNIT/ML ~~LOC~~ SOLN
0.0000 [IU] | SUBCUTANEOUS | Status: DC
  Administered 2014-04-28 – 2014-04-29 (×5): 1 [IU] via SUBCUTANEOUS
  Administered 2014-04-29: 2 [IU] via SUBCUTANEOUS
  Administered 2014-04-29 (×2): 1 [IU] via SUBCUTANEOUS
  Administered 2014-04-30 (×2): 2 [IU] via SUBCUTANEOUS
  Administered 2014-04-30: 1 [IU] via SUBCUTANEOUS
  Administered 2014-04-30: 2 [IU] via SUBCUTANEOUS
  Administered 2014-04-30 – 2014-05-02 (×13): 1 [IU] via SUBCUTANEOUS

## 2014-04-28 MED ORDER — ENOXAPARIN SODIUM 80 MG/0.8ML ~~LOC~~ SOLN
1.0000 mg/kg | Freq: Two times a day (BID) | SUBCUTANEOUS | Status: DC
  Administered 2014-04-28 – 2014-05-05 (×13): 80 mg via SUBCUTANEOUS
  Filled 2014-04-28 (×19): qty 0.8

## 2014-04-28 MED ORDER — POTASSIUM CHLORIDE CRYS ER 20 MEQ PO TBCR
20.0000 meq | EXTENDED_RELEASE_TABLET | Freq: Once | ORAL | Status: AC
  Administered 2014-04-28: 20 meq via ORAL
  Filled 2014-04-28: qty 1

## 2014-04-28 MED ORDER — TRACE MINERALS CR-CU-F-FE-I-MN-MO-SE-ZN IV SOLN
INTRAVENOUS | Status: AC
  Administered 2014-04-28 (×2): via INTRAVENOUS
  Filled 2014-04-28: qty 960

## 2014-04-28 MED ORDER — HYDROMORPHONE 0.3 MG/ML IV SOLN
INTRAVENOUS | Status: DC
  Administered 2014-04-28: 6.17 mg via INTRAVENOUS
  Administered 2014-04-28: 2.1 mg via INTRAVENOUS
  Administered 2014-04-28: 4.9 mg via INTRAVENOUS
  Administered 2014-04-28: 5.2 mg via INTRAVENOUS
  Administered 2014-04-28 (×3): via INTRAVENOUS
  Administered 2014-04-29: 6.49 mg via INTRAVENOUS
  Administered 2014-04-29: 6.06 mg via INTRAVENOUS
  Administered 2014-04-29: 7.67 mg via INTRAVENOUS
  Administered 2014-04-29 (×5): via INTRAVENOUS
  Administered 2014-04-29: 4.99 mg via INTRAVENOUS
  Administered 2014-04-30: 3.6 mg via INTRAVENOUS
  Administered 2014-04-30: 7.2 mg via INTRAVENOUS
  Administered 2014-04-30: 2.2 mg via INTRAVENOUS
  Administered 2014-04-30: 7.67 mg via INTRAVENOUS
  Administered 2014-04-30: 1.42 mg via INTRAVENOUS
  Administered 2014-05-01: 5.1 mg via INTRAVENOUS
  Administered 2014-05-01: 21:00:00 via INTRAVENOUS
  Administered 2014-05-01: 7.58 mg via INTRAVENOUS
  Administered 2014-05-01: 2.55 mg via INTRAVENOUS
  Administered 2014-05-01: 0.716 mg via INTRAVENOUS
  Administered 2014-05-01: 3.3 mg via INTRAVENOUS
  Administered 2014-05-02: 6.6 mg via INTRAVENOUS
  Administered 2014-05-02: 7.4 mg via INTRAVENOUS
  Administered 2014-05-02: 4.36 mg via INTRAVENOUS
  Administered 2014-05-02: 5.4 mg via INTRAVENOUS
  Administered 2014-05-02 (×3): via INTRAVENOUS
  Administered 2014-05-03: 3 mg via INTRAVENOUS
  Administered 2014-05-03: 1.8 mg via INTRAVENOUS
  Administered 2014-05-03: 06:00:00 via INTRAVENOUS
  Administered 2014-05-03: 4.4 mg via INTRAVENOUS
  Administered 2014-05-03: 6 mg via INTRAVENOUS
  Administered 2014-05-03: 6.6 mg via INTRAVENOUS
  Filled 2014-04-28 (×21): qty 25

## 2014-04-28 MED ORDER — DEXTROSE 5 % IV SOLN
1.0000 g | Freq: Three times a day (TID) | INTRAVENOUS | Status: DC
  Administered 2014-04-28 – 2014-05-04 (×19): 1 g via INTRAVENOUS
  Filled 2014-04-28 (×21): qty 1

## 2014-04-28 MED ORDER — SODIUM CHLORIDE 0.9 % IJ SOLN: 9.0000 mL | INTRAMUSCULAR | Status: DC | PRN

## 2014-04-28 MED ORDER — FAT EMULSION 20 % IV EMUL
240.0000 mL | INTRAVENOUS | Status: AC
  Administered 2014-04-28 (×2): 240 mL via INTRAVENOUS
  Filled 2014-04-28: qty 250

## 2014-04-28 MED ORDER — DIPHENHYDRAMINE HCL 50 MG/ML IJ SOLN: 12.5000 mg | Freq: Four times a day (QID) | INTRAMUSCULAR | Status: DC | PRN

## 2014-04-28 MED ORDER — HYDROMORPHONE HCL 1 MG/ML IJ SOLN
1.0000 mg | Freq: Once | INTRAMUSCULAR | Status: AC
  Administered 2014-04-28: 1 mg via INTRAVENOUS
  Filled 2014-04-28: qty 1

## 2014-04-28 MED ORDER — NALOXONE HCL 0.4 MG/ML IJ SOLN: 0.4000 mg | INTRAMUSCULAR | Status: DC | PRN

## 2014-04-28 MED ORDER — FLUCONAZOLE IN SODIUM CHLORIDE 400-0.9 MG/200ML-% IV SOLN
400.0000 mg | INTRAVENOUS | Status: DC
  Administered 2014-04-28 – 2014-05-04 (×7): 400 mg via INTRAVENOUS
  Filled 2014-04-28 (×7): qty 200

## 2014-04-28 MED ORDER — SODIUM CHLORIDE 0.9 % IJ SOLN
10.0000 mL | INTRAMUSCULAR | Status: DC | PRN
  Administered 2014-04-29 – 2014-05-13 (×13): 10 mL
  Filled 2014-04-28 (×13): qty 40

## 2014-04-28 MED ORDER — ACETAMINOPHEN 325 MG PO TABS
650.0000 mg | ORAL_TABLET | ORAL | Status: DC | PRN
  Administered 2014-04-28 – 2014-05-06 (×7): 650 mg via ORAL
  Filled 2014-04-28 (×8): qty 2

## 2014-04-28 MED ORDER — VANCOMYCIN HCL 10 G IV SOLR
1500.0000 mg | Freq: Two times a day (BID) | INTRAVENOUS | Status: DC
  Administered 2014-04-28 – 2014-05-02 (×9): 1500 mg via INTRAVENOUS
  Filled 2014-04-28 (×10): qty 1500

## 2014-04-28 MED ORDER — DIPHENHYDRAMINE HCL 12.5 MG/5ML PO ELIX: 12.5000 mg | ORAL_SOLUTION | Freq: Four times a day (QID) | ORAL | Status: DC | PRN
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
## 2014-04-28 NOTE — Progress Notes (Signed)
Reviewed SP drain volumes as well- variable with possible downward trend May take weeks for bladder to heal= pt aware
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
## 2014-04-28 NOTE — Progress Notes (Signed)
Reviewed chart and spoke with Dr Louis Meckel Still has bladder leak with non-expanding urinoma/collection ? Distal left ureter ok but no behaving like it is disrupted No cystogram/retrograde/cysto at this time Not the cause of rt groin pain Will follow
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
## 2014-04-28 NOTE — Progress Notes (Signed)
Peripherally Inserted Central Catheter/Midline Placement  The IV Nurse has discussed with the patient and/or persons authorized to consent for the patient, the purpose of this procedure and the potential benefits and risks involved with this procedure.  The benefits include less needle sticks, lab draws from the catheter and patient may be discharged home with the catheter.  Risks include, but not limited to, infection, bleeding, blood clot (thrombus formation), and puncture of an artery; nerve damage and irregular heat beat.  Alternatives to this procedure were also discussed.  PICC/Midline Placement Documentation  PICC / Midline Double Lumen 38/75/64 PICC Left Basilic 43 cm 2 cm (Active)  Indication for Insertion or Continuance of Line Administration of hyperosmolar/irritating solutions (i.e. TPN, Vancomycin, etc.) 04/28/2014  9:00 AM  Exposed Catheter (cm) 2 cm 04/28/2014  9:00 AM  Dressing Change Due 05/05/14 04/28/2014  9:00 AM       Jule Economy Horton 04/28/2014, 10:00 AM
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
## 2014-04-28 NOTE — Consult Note (Addendum)
WOC ostomy consult note Stoma type/location:  Colostomy stoma to LLQ Stomal assessment/size: Stoma red and viable, flush with skin level, 1 1/2 inches Peristomal assessment:  Intact skin surrounding Output 50cc liquid brown stool in pouch Ostomy pouching: 1pc Education provided: Pt assisted with pouch application and is able to open and close velcro to empty.  Applied one piece pouch and barrier ring to maintain seal.  Supplies at bedside for staff use.  Pt participates with pouch change and asks appropriate questions.  WOC wound consult note Reason for Consult: Requested to assess abd wound for possible Vac application.  This wound previously had Vac applied; refer to progress notes on 12/28 when Vac was discontinued by trauma team after wound assessment. No decline in wound has occurred since that time. Wound type: Full thickness post-op wound Measurement:23X3X3cm Wound bed: 100% beefy red and granulating Drainage (amount, consistency, odor) Small amt yellow drainage, no odor Periwound: Intact skin surrounding. Dressing procedure/placement/frequency: Continue present plan of care with moist fluffed gauze to promote healing. Julien Girt MSN, RN, [place], [place], [place]

## 2014-04-29 ENCOUNTER — Encounter (HOSPITAL_COMMUNITY): Admission: EM | Disposition: A | Discharge status: Home Health | Payer: Self-pay | Source: Home / Self Care

## 2014-04-29 ENCOUNTER — Inpatient Hospital Stay (HOSPITAL_COMMUNITY): Payer: Self-pay | Admitting: Anesthesiology

## 2014-04-29 ENCOUNTER — Encounter (HOSPITAL_COMMUNITY): Payer: Self-pay | Admitting: Anesthesiology

## 2014-04-29 DIAGNOSIS — L02214 Cutaneous abscess of groin: Secondary | ICD-10-CM | POA: Diagnosis not present

## 2014-04-29 HISTORY — PX: IRRIGATION AND DEBRIDEMENT ABSCESS: SHX5252

## 2014-04-29 LAB — DIFFERENTIAL
BASOS PCT: 0 % (ref 0–1)
Basophils Absolute: 0 10*3/uL (ref 0.0–0.1)
Eosinophils Absolute: 0.1 10*3/uL (ref 0.0–0.7)
Eosinophils Relative: 1 % (ref 0–5)
Lymphocytes Relative: 11 % — ABNORMAL LOW (ref 12–46)
Lymphs Abs: 1.3 10*3/uL (ref 0.7–4.0)
Monocytes Absolute: 0.8 10*3/uL (ref 0.1–1.0)
Monocytes Relative: 7 % (ref 3–12)
NEUTROS PCT: 81 % — AB (ref 43–77)
Neutro Abs: 9.8 10*3/uL — ABNORMAL HIGH (ref 1.7–7.7)

## 2014-04-29 LAB — CBC
HEMATOCRIT: 24.5 % — AB (ref 39.0–52.0)
Hemoglobin: 8.4 g/dL — ABNORMAL LOW (ref 13.0–17.0)
MCH: 28.7 pg (ref 26.0–34.0)
MCHC: 34.3 g/dL (ref 30.0–36.0)
MCV: 83.6 fL (ref 78.0–100.0)
Platelets: 482 10*3/uL — ABNORMAL HIGH (ref 150–400)
RBC: 2.93 MIL/uL — ABNORMAL LOW (ref 4.22–5.81)
RDW: 14.9 % (ref 11.5–15.5)
WBC: 12.1 10*3/uL — ABNORMAL HIGH (ref 4.0–10.5)

## 2014-04-29 LAB — COMPREHENSIVE METABOLIC PANEL
ALT: 26 U/L (ref 0–53)
ANION GAP: 12 (ref 5–15)
AST: 30 U/L (ref 0–37)
Albumin: 2.1 g/dL — ABNORMAL LOW (ref 3.5–5.2)
Alkaline Phosphatase: 162 U/L — ABNORMAL HIGH (ref 39–117)
CALCIUM: 8 mg/dL — AB (ref 8.4–10.5)
CO2: 20 mmol/L (ref 19–32)
Chloride: 97 meq/L (ref 96–112)
Creatinine, Ser: 0.61 mg/dL (ref 0.50–1.35)
GFR calc Af Amer: >90 mL/min (ref >90)
GFR calc non Af Amer: >90 mL/min (ref >90)
Glucose, Bld: 140 mg/dL — ABNORMAL HIGH (ref 70–99)
POTASSIUM: 4 mmol/L (ref 3.5–5.1)
SODIUM: 129 mmol/L — AB (ref 135–145)
TOTAL PROTEIN: 6.4 g/dL (ref 6.0–8.3)
Total Bilirubin: 0.9 mg/dL (ref 0.3–1.2)

## 2014-04-29 LAB — GLUCOSE, CAPILLARY
GLUCOSE-CAPILLARY: 143 mg/dL — AB (ref 70–99)
GLUCOSE-CAPILLARY: 135 mg/dL — AB (ref 70–99)
Glucose-Capillary: 152 mg/dL — ABNORMAL HIGH (ref 70–99)
Glucose-Capillary: 148 mg/dL — ABNORMAL HIGH (ref 70–99)
Glucose-Capillary: 128 mg/dL — ABNORMAL HIGH (ref 70–99)

## 2014-04-29 LAB — TRIGLYCERIDES: TRIGLYCERIDES: 83 mg/dL (ref <150)

## 2014-04-29 LAB — MAGNESIUM: Magnesium: 1.9 mg/dL (ref 1.5–2.5)

## 2014-04-29 LAB — PREALBUMIN: Prealbumin: 7.5 mg/dL — ABNORMAL LOW (ref 17.0–34.0)

## 2014-04-29 LAB — PHOSPHORUS: PHOSPHORUS: 3.9 mg/dL (ref 2.3–4.6)

## 2014-04-29 SURGERY — IRRIGATION AND DEBRIDEMENT ABSCESS
Anesthesia: General | Site: Groin | Laterality: Right

## 2014-04-29 MED ORDER — ONDANSETRON HCL 4 MG/2ML IJ SOLN: 4.0000 mg | Freq: Once | INTRAMUSCULAR | Status: DC | PRN

## 2014-04-29 MED ORDER — ACETAMINOPHEN 325 MG PO TABS
650.0000 mg | ORAL_TABLET | Freq: Once | ORAL | Status: AC
  Administered 2014-04-29: 650 mg via ORAL

## 2014-04-29 MED ORDER — FENTANYL CITRATE 0.05 MG/ML IJ SOLN
INTRAMUSCULAR | Status: AC
  Filled 2014-04-29: qty 5

## 2014-04-29 MED ORDER — ONDANSETRON HCL 4 MG/2ML IJ SOLN
INTRAMUSCULAR | Status: DC | PRN
  Administered 2014-04-29: 4 mg via INTRAVENOUS

## 2014-04-29 MED ORDER — LIDOCAINE HCL (CARDIAC) 20 MG/ML IV SOLN
INTRAVENOUS | Status: DC | PRN
  Administered 2014-04-29: 100 mg via INTRAVENOUS

## 2014-04-29 MED ORDER — TRACE MINERALS CR-CU-F-FE-I-MN-MO-SE-ZN IV SOLN
INTRAVENOUS | Status: AC
  Administered 2014-04-29: 18:00:00 via INTRAVENOUS
  Filled 2014-04-29: qty 1440

## 2014-04-29 MED ORDER — 0.9 % SODIUM CHLORIDE (POUR BTL) OPTIME
TOPICAL | Status: DC | PRN
  Administered 2014-04-29: 1000 mL

## 2014-04-29 MED ORDER — ARTIFICIAL TEARS OP OINT
TOPICAL_OINTMENT | OPHTHALMIC | Status: DC | PRN
  Administered 2014-04-29: 1 via OPHTHALMIC

## 2014-04-29 MED ORDER — PROPOFOL 10 MG/ML IV BOLUS
INTRAVENOUS | Status: AC
  Filled 2014-04-29: qty 20

## 2014-04-29 MED ORDER — LACTATED RINGERS IV SOLN
INTRAVENOUS | Status: DC | PRN
  Administered 2014-04-29: 13:00:00 via INTRAVENOUS

## 2014-04-29 MED ORDER — LACTATED RINGERS IV SOLN
INTRAVENOUS | Status: DC
Start: 2014-04-29 — End: 2014-05-01
  Administered 2014-04-29: 13:00:00 via INTRAVENOUS

## 2014-04-29 MED ORDER — ARTIFICIAL TEARS OP OINT
TOPICAL_OINTMENT | OPHTHALMIC | Status: AC
  Filled 2014-04-29: qty 3.5

## 2014-04-29 MED ORDER — MIDAZOLAM HCL 5 MG/5ML IJ SOLN
INTRAMUSCULAR | Status: DC | PRN
  Administered 2014-04-29: 1 mg via INTRAVENOUS

## 2014-04-29 MED ORDER — SUCCINYLCHOLINE CHLORIDE 20 MG/ML IJ SOLN
INTRAMUSCULAR | Status: AC
Start: 2014-04-29 — End: 2014-04-29
  Filled 2014-04-29: qty 1

## 2014-04-29 MED ORDER — HYDROMORPHONE HCL 1 MG/ML IJ SOLN
0.2500 mg | INTRAMUSCULAR | Status: DC | PRN
  Administered 2014-04-29: 1 mg via INTRAVENOUS

## 2014-04-29 MED ORDER — CALCIUM CARBONATE 1250 (500 CA) MG PO TABS
1.0000 | ORAL_TABLET | Freq: Two times a day (BID) | ORAL | Status: DC
  Administered 2014-04-29 – 2014-05-13 (×25): 500 mg via ORAL
  Filled 2014-04-29 (×37): qty 1

## 2014-04-29 MED ORDER — FENTANYL CITRATE 0.05 MG/ML IJ SOLN
INTRAMUSCULAR | Status: DC | PRN
  Administered 2014-04-29: 100 ug via INTRAVENOUS
  Administered 2014-04-29 (×2): 50 ug via INTRAVENOUS

## 2014-04-29 MED ORDER — LIDOCAINE HCL (CARDIAC) 20 MG/ML IV SOLN
INTRAVENOUS | Status: AC
  Filled 2014-04-29: qty 5

## 2014-04-29 MED ORDER — PROPOFOL 10 MG/ML IV BOLUS
INTRAVENOUS | Status: DC | PRN
  Administered 2014-04-29: 200 mg via INTRAVENOUS

## 2014-04-29 MED ORDER — MIDAZOLAM HCL 2 MG/2ML IJ SOLN
INTRAMUSCULAR | Status: AC
  Filled 2014-04-29: qty 2

## 2014-04-29 MED ORDER — HYDROMORPHONE HCL 1 MG/ML IJ SOLN
INTRAMUSCULAR | Status: AC
  Filled 2014-04-29: qty 1

## 2014-04-29 MED ORDER — ONDANSETRON HCL 4 MG/2ML IJ SOLN
INTRAMUSCULAR | Status: AC
  Filled 2014-04-29: qty 2

## 2014-04-29 MED ORDER — FAT EMULSION 20 % IV EMUL
240.0000 mL | INTRAVENOUS | Status: AC
  Administered 2014-04-29: 240 mL via INTRAVENOUS
  Filled 2014-04-29: qty 250

## 2014-04-29 MED ORDER — METOPROLOL TARTRATE 1 MG/ML IV SOLN
INTRAVENOUS | Status: AC
  Filled 2014-04-29: qty 10

## 2014-04-29 MED ORDER — SUCCINYLCHOLINE CHLORIDE 20 MG/ML IJ SOLN
INTRAMUSCULAR | Status: DC | PRN
  Administered 2014-04-29: 100 mg via INTRAVENOUS

## 2014-04-29 SURGICAL SUPPLY — 38 items
BNDG GAUZE ELAST 4 BULKY (GAUZE/BANDAGES/DRESSINGS) ×2 IMPLANT
CANISTER SUCTION 2500CC (MISCELLANEOUS) ×3 IMPLANT
COVER SURGICAL LIGHT HANDLE (MISCELLANEOUS) ×3 IMPLANT
DRAIN JACKSON PRT FLT 10 (DRAIN) ×2 IMPLANT
DRAPE LAPAROSCOPIC ABDOMINAL (DRAPES) ×3 IMPLANT
DRAPE UTILITY XL STRL (DRAPES) ×6 IMPLANT
DRSG PAD ABDOMINAL 8X10 ST (GAUZE/BANDAGES/DRESSINGS) ×5 IMPLANT
ELECT CAUTERY BLADE 6.4 (BLADE) ×3 IMPLANT
ELECT REM PT RETURN 9FT ADLT (ELECTROSURGICAL) ×3
ELECTRODE REM PT RTRN 9FT ADLT (ELECTROSURGICAL) ×1 IMPLANT
EVACUATOR SILICONE 100CC (DRAIN) ×2 IMPLANT
GAUZE PACKING IODOFORM 1/4X15 (GAUZE/BANDAGES/DRESSINGS) ×2 IMPLANT
GAUZE SPONGE 4X4 12PLY STRL (GAUZE/BANDAGES/DRESSINGS) ×3 IMPLANT
GLOVE BIO SURGEON STRL SZ7 (GLOVE) ×4 IMPLANT
GLOVE BIO SURGEON STRL SZ8 (GLOVE) ×3 IMPLANT
GLOVE BIOGEL PI IND STRL 6.5 (GLOVE) IMPLANT
GLOVE BIOGEL PI IND STRL 7.5 (GLOVE) IMPLANT
GLOVE BIOGEL PI IND STRL 8 (GLOVE) ×1 IMPLANT
GLOVE BIOGEL PI INDICATOR 6.5 (GLOVE) ×2
GLOVE BIOGEL PI INDICATOR 7.5 (GLOVE) ×2
GLOVE BIOGEL PI INDICATOR 8 (GLOVE) ×2
GLOVE ECLIPSE 6.5 STRL STRAW (GLOVE) ×2 IMPLANT
GLOVE SURG SS PI 7.5 STRL IVOR (GLOVE) ×2 IMPLANT
GOWN STRL REUS W/ TWL LRG LVL3 (GOWN DISPOSABLE) ×1 IMPLANT
GOWN STRL REUS W/ TWL XL LVL3 (GOWN DISPOSABLE) ×1 IMPLANT
GOWN STRL REUS W/TWL LRG LVL3 (GOWN DISPOSABLE) ×3
GOWN STRL REUS W/TWL XL LVL3 (GOWN DISPOSABLE) ×3
KIT BASIN OR (CUSTOM PROCEDURE TRAY) ×3 IMPLANT
KIT ROOM TURNOVER OR (KITS) ×3 IMPLANT
NS IRRIG 1000ML POUR BTL (IV SOLUTION) ×3 IMPLANT
PACK GENERAL/GYN (CUSTOM PROCEDURE TRAY) ×3 IMPLANT
PAD ARMBOARD 7.5X6 YLW CONV (MISCELLANEOUS) ×3 IMPLANT
SPONGE GAUZE 4X4 12PLY STER LF (GAUZE/BANDAGES/DRESSINGS) ×2 IMPLANT
SUT ETHILON 2 0 FS 18 (SUTURE) ×2 IMPLANT
SWAB COLLECTION DEVICE MRSA (MISCELLANEOUS) ×2 IMPLANT
TOWEL OR 17X24 6PK STRL BLUE (TOWEL DISPOSABLE) ×3 IMPLANT
TOWEL OR 17X26 10 PK STRL BLUE (TOWEL DISPOSABLE) ×3 IMPLANT
TUBE ANAEROBIC SPECIMEN COL (MISCELLANEOUS) ×2 IMPLANT

## 2014-04-29 NOTE — Progress Notes (Signed)
   04/29/14 1500  Clinical Encounter Type  Visited With Patient;Health care provider  Visit Type Initial;Spiritual support;Social support   Chaplain visited with patient briefly today while rounding the unit. Patient said that he was doing fine. Patient also explained that he had a surgery today and that that went fine as well. Patient did not have anything that he wanted to talk about today. Chaplain will continue to provide emotional and spiritual support for patient as needed. Gar Ponto, Chaplain  3:43 PM
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
## 2014-04-29 NOTE — Progress Notes (Signed)
Patient complaining of headache despite tylenol given, MD on call notified and new order noted.

## 2014-04-29 NOTE — Progress Notes (Signed)
UR completed.  Tentative plan is for CIR when medically stable.   Sandi Mariscal, RN BSN [place] CCM Trauma/Neuro ICU Case Manager [phone]
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
## 2014-04-29 NOTE — Progress Notes (Signed)
Will continue to follow Urology
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
## 2014-04-29 NOTE — Progress Notes (Signed)
PT Cancellation Note  Patient Details Name: Jaylene Schrom MRN: [id number] DOB: 09-08-1985   Cancelled Treatment:    Reason Eval/Treat Not Completed: Patient at procedure or test/unavailable. Patient for I&D tomorrow. Will follow up tomorrow   Jacqualyn Posey 04/29/2014, 11:17 AM

## 2014-04-30 LAB — BASIC METABOLIC PANEL
Anion gap: 10 (ref 5–15)
BUN: <5 mg/dL — ABNORMAL LOW (ref 6–23)
CALCIUM: 7.7 mg/dL — AB (ref 8.4–10.5)
CO2: 22 mmol/L (ref 19–32)
Chloride: 97 mEq/L (ref 96–112)
Creatinine, Ser: 0.64 mg/dL (ref 0.50–1.35)
GFR calc Af Amer: >90 mL/min (ref >90)
Glucose, Bld: 166 mg/dL — ABNORMAL HIGH (ref 70–99)
Potassium: 3.8 mmol/L (ref 3.5–5.1)
Sodium: 129 mmol/L — ABNORMAL LOW (ref 135–145)

## 2014-04-30 LAB — CBC
HCT: 23.7 % — ABNORMAL LOW (ref 39.0–52.0)
Hemoglobin: 8 g/dL — ABNORMAL LOW (ref 13.0–17.0)
MCH: 27.5 pg (ref 26.0–34.0)
MCHC: 33.8 g/dL (ref 30.0–36.0)
MCV: 81.4 fL (ref 78.0–100.0)
Platelets: 428 10*3/uL — ABNORMAL HIGH (ref 150–400)
RBC: 2.91 MIL/uL — ABNORMAL LOW (ref 4.22–5.81)
RDW: 15.7 % — ABNORMAL HIGH (ref 11.5–15.5)
WBC: 15.7 10*3/uL — ABNORMAL HIGH (ref 4.0–10.5)

## 2014-04-30 LAB — GLUCOSE, CAPILLARY
Glucose-Capillary: 123 mg/dL — ABNORMAL HIGH (ref 70–99)
Glucose-Capillary: 129 mg/dL — ABNORMAL HIGH (ref 70–99)
Glucose-Capillary: 143 mg/dL — ABNORMAL HIGH (ref 70–99)
Glucose-Capillary: 151 mg/dL — ABNORMAL HIGH (ref 70–99)
Glucose-Capillary: 154 mg/dL — ABNORMAL HIGH (ref 70–99)
Glucose-Capillary: 185 mg/dL — ABNORMAL HIGH (ref 70–99)

## 2014-04-30 MED ORDER — WARFARIN SODIUM 10 MG PO TABS
10.0000 mg | ORAL_TABLET | Freq: Once | ORAL | Status: AC
  Administered 2014-04-30: 10 mg via ORAL
  Filled 2014-04-30: qty 1

## 2014-04-30 MED ORDER — WARFARIN VIDEO
Freq: Once | Status: AC
  Administered 2014-04-30: 1

## 2014-04-30 MED ORDER — COUMADIN BOOK
Freq: Once | Status: AC
  Administered 2014-04-30: 1
  Filled 2014-04-30: qty 1

## 2014-04-30 MED ORDER — FAT EMULSION 20 % IV EMUL
240.0000 mL | INTRAVENOUS | Status: AC
  Administered 2014-04-30: 240 mL via INTRAVENOUS
  Filled 2014-04-30: qty 250

## 2014-04-30 MED ORDER — WARFARIN - PHARMACIST DOSING INPATIENT
Freq: Every day | Status: DC
  Administered 2014-05-03 – 2014-05-08 (×2)

## 2014-04-30 MED ORDER — TRACE MINERALS CR-CU-F-FE-I-MN-MO-SE-ZN IV SOLN
INTRAVENOUS | Status: AC
  Administered 2014-04-30: 18:00:00 via INTRAVENOUS
  Filled 2014-04-30: qty 1992

## 2014-04-30 NOTE — Progress Notes (Signed)
Drain output decreasing Good sign- will follow
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
## 2014-04-30 NOTE — Progress Notes (Signed)
OT Cancellation Note  Patient Details Name: Carlos Lawrence MRN: [id number] DOB: [birth year]   Cancelled Treatment:    Reason Eval/Treat Not Completed: Other (comment) Pt refusing to get up with OT, but asked about UE exercises. Left MD a note about clarification for exercises with RUE.   Benito Mccreedy OTR/L [phone] 04/30/2014, 3:59 PM
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
## 2014-05-01 ENCOUNTER — Encounter (HOSPITAL_COMMUNITY): Payer: Self-pay | Admitting: General Surgery

## 2014-05-01 LAB — GLUCOSE, CAPILLARY
GLUCOSE-CAPILLARY: 126 mg/dL — AB (ref 70–99)
GLUCOSE-CAPILLARY: 127 mg/dL — AB (ref 70–99)
GLUCOSE-CAPILLARY: 138 mg/dL — AB (ref 70–99)
GLUCOSE-CAPILLARY: 149 mg/dL — AB (ref 70–99)
Glucose-Capillary: 121 mg/dL — ABNORMAL HIGH (ref 70–99)
Glucose-Capillary: 132 mg/dL — ABNORMAL HIGH (ref 70–99)
Glucose-Capillary: 142 mg/dL — ABNORMAL HIGH (ref 70–99)

## 2014-05-01 LAB — COMPREHENSIVE METABOLIC PANEL
ALBUMIN: 1.6 g/dL — AB (ref 3.5–5.2)
ALT: 22 U/L (ref 0–53)
AST: 26 U/L (ref 0–37)
Alkaline Phosphatase: 142 U/L — ABNORMAL HIGH (ref 39–117)
Anion gap: 6 (ref 5–15)
BUN: 6 mg/dL (ref 6–23)
CALCIUM: 7.7 mg/dL — AB (ref 8.4–10.5)
CO2: 26 mmol/L (ref 19–32)
CREATININE: 0.62 mg/dL (ref 0.50–1.35)
Chloride: 99 mEq/L (ref 96–112)
GFR calc Af Amer: >90 mL/min (ref >90)
Glucose, Bld: 120 mg/dL — ABNORMAL HIGH (ref 70–99)
Potassium: 3.8 mmol/L (ref 3.5–5.1)
SODIUM: 131 mmol/L — AB (ref 135–145)
Total Bilirubin: 0.5 mg/dL (ref 0.3–1.2)
Total Protein: 5.7 g/dL — ABNORMAL LOW (ref 6.0–8.3)

## 2014-05-01 LAB — PROTIME-INR
INR: 1.17 (ref 0.00–1.49)
PROTHROMBIN TIME: 15.1 s (ref 11.6–15.2)

## 2014-05-01 LAB — PHOSPHORUS: Phosphorus: 3.5 mg/dL (ref 2.3–4.6)

## 2014-05-01 LAB — MAGNESIUM: MAGNESIUM: 1.9 mg/dL (ref 1.5–2.5)

## 2014-05-01 MED ORDER — DOCUSATE SODIUM 100 MG PO CAPS
200.0000 mg | ORAL_CAPSULE | Freq: Two times a day (BID) | ORAL | Status: DC
Start: 2014-05-01 — End: 2014-05-13
  Administered 2014-05-01 – 2014-05-13 (×23): 200 mg via ORAL
  Filled 2014-05-01 (×26): qty 2

## 2014-05-01 MED ORDER — FAT EMULSION 20 % IV EMUL
240.0000 mL | INTRAVENOUS | Status: DC
  Administered 2014-05-01: 240 mL via INTRAVENOUS
  Filled 2014-05-01: qty 250

## 2014-05-01 MED ORDER — METHOCARBAMOL 500 MG PO TABS
1000.0000 mg | ORAL_TABLET | Freq: Four times a day (QID) | ORAL | Status: DC | PRN
  Administered 2014-05-01 – 2014-05-04 (×10): 1000 mg via ORAL
  Filled 2014-05-01 (×10): qty 2

## 2014-05-01 MED ORDER — WARFARIN SODIUM 10 MG PO TABS
10.0000 mg | ORAL_TABLET | Freq: Once | ORAL | Status: AC
  Administered 2014-05-01: 10 mg via ORAL
  Filled 2014-05-01: qty 1

## 2014-05-01 MED ORDER — TRACE MINERALS CR-CU-F-FE-I-MN-MO-SE-ZN IV SOLN
INTRAVENOUS | Status: DC
  Administered 2014-05-01: 17:00:00 via INTRAVENOUS
  Filled 2014-05-01: qty 2400
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
## 2014-05-01 NOTE — Progress Notes (Signed)
Rehab admissions - Patient not medically ready yet for acute inpatient rehab admission.  Noted OT note from today.  I will follow up for progress and plans on Monday next week.  Call me for questions.  #[phone]

## 2014-05-01 NOTE — Discharge Instructions (Addendum)
Wash wounds daily with soap and water. Do not soak. Apply antibiotic ointment (e.g. Neosporin) twice daily and as needed to keep moist. Cover with dry dressing.  No driving.

## 2014-05-01 NOTE — Progress Notes (Signed)
OT Cancellation Note  Patient Details Name: Carlos Lawrence MRN: [id number] DOB: 1986-02-07   Cancelled Treatment:    Reason Eval/Treat Not Completed: Pain limiting ability to participate Upon entering room, patient found supine in bed. Therapist explained the role of OT and patient refused any therapy at this time secondary to "I hurt too much". Even with encouragement for UE exercises, patient refused. Therapist encouraged patient to work with therapist as much as possible and patient stated "y'all push too much". Left patient supine in bed with all needs within reach and mother present at bedside.   [name] 05/01/2014, 1:36 PM
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
## 2014-05-02 LAB — GLUCOSE, CAPILLARY
Glucose-Capillary: 133 mg/dL — ABNORMAL HIGH (ref 70–99)
Glucose-Capillary: 149 mg/dL — ABNORMAL HIGH (ref 70–99)
Glucose-Capillary: 147 mg/dL — ABNORMAL HIGH (ref 70–99)
Glucose-Capillary: 140 mg/dL — ABNORMAL HIGH (ref 70–99)

## 2014-05-02 LAB — BASIC METABOLIC PANEL
ANION GAP: 6 (ref 5–15)
BUN: 7 mg/dL (ref 6–23)
CO2: 25 mmol/L (ref 19–32)
CREATININE: 0.59 mg/dL (ref 0.50–1.35)
Calcium: 7.6 mg/dL — ABNORMAL LOW (ref 8.4–10.5)
Chloride: 98 mEq/L (ref 96–112)
GFR calc Af Amer: >90 mL/min (ref >90)
GFR calc non Af Amer: >90 mL/min (ref >90)
Glucose, Bld: 157 mg/dL — ABNORMAL HIGH (ref 70–99)
Potassium: 3.8 mmol/L (ref 3.5–5.1)
Sodium: 129 mmol/L — ABNORMAL LOW (ref 135–145)

## 2014-05-02 LAB — CREATININE, FLUID (PLEURAL, PERITONEAL, JP DRAINAGE): CREAT FL: 24.9 mg/dL

## 2014-05-02 LAB — CBC
HCT: 22.4 % — ABNORMAL LOW (ref 39.0–52.0)
HEMOGLOBIN: 7.4 g/dL — AB (ref 13.0–17.0)
MCH: 26.5 pg (ref 26.0–34.0)
MCHC: 33 g/dL (ref 30.0–36.0)
MCV: 80.3 fL (ref 78.0–100.0)
Platelets: 384 10*3/uL (ref 150–400)
RBC: 2.79 MIL/uL — ABNORMAL LOW (ref 4.22–5.81)
RDW: 16.1 % — ABNORMAL HIGH (ref 11.5–15.5)
WBC: 14.4 10*3/uL — ABNORMAL HIGH (ref 4.0–10.5)

## 2014-05-02 LAB — PROTIME-INR
INR: 1.17 (ref 0.00–1.49)
PROTHROMBIN TIME: 15.1 s (ref 11.6–15.2)

## 2014-05-02 LAB — VANCOMYCIN, TROUGH: VANCOMYCIN TR: 9.6 ug/mL — AB (ref 10.0–20.0)

## 2014-05-02 MED ORDER — WARFARIN SODIUM 2.5 MG PO TABS
12.5000 mg | ORAL_TABLET | Freq: Once | ORAL | Status: AC
  Administered 2014-05-02: 12.5 mg via ORAL
  Filled 2014-05-02: qty 1

## 2014-05-02 MED ORDER — VANCOMYCIN HCL 10 G IV SOLR
1750.0000 mg | Freq: Two times a day (BID) | INTRAVENOUS | Status: DC
  Administered 2014-05-02 – 2014-05-12 (×22): 1750 mg via INTRAVENOUS
  Filled 2014-05-02 (×24): qty 1750
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
## 2014-05-02 NOTE — Progress Notes (Signed)
JP drain volumes low Send for Cr DO NOT REMOVE DRAIN without my clearance even if no urine in JP drain
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
## 2014-05-03 LAB — BASIC METABOLIC PANEL
Anion gap: 7 (ref 5–15)
BUN: 6 mg/dL (ref 6–23)
CALCIUM: 8 mg/dL — AB (ref 8.4–10.5)
CO2: 24 mmol/L (ref 19–32)
CREATININE: 0.61 mg/dL (ref 0.50–1.35)
Chloride: 97 mEq/L (ref 96–112)
GFR calc non Af Amer: >90 mL/min (ref >90)
GLUCOSE: 100 mg/dL — AB (ref 70–99)
Potassium: 4.1 mmol/L (ref 3.5–5.1)
Sodium: 128 mmol/L — ABNORMAL LOW (ref 135–145)

## 2014-05-03 LAB — CULTURE, ROUTINE-ABSCESS

## 2014-05-03 LAB — CBC
HEMATOCRIT: 22.3 % — AB (ref 39.0–52.0)
HEMOGLOBIN: 7.4 g/dL — AB (ref 13.0–17.0)
MCH: 26.7 pg (ref 26.0–34.0)
MCHC: 33.2 g/dL (ref 30.0–36.0)
MCV: 80.5 fL (ref 78.0–100.0)
Platelets: 444 10*3/uL — ABNORMAL HIGH (ref 150–400)
RBC: 2.77 MIL/uL — ABNORMAL LOW (ref 4.22–5.81)
RDW: 16.4 % — AB (ref 11.5–15.5)
WBC: 15.2 10*3/uL — ABNORMAL HIGH (ref 4.0–10.5)

## 2014-05-03 LAB — PROTIME-INR
INR: 1.41 (ref 0.00–1.49)
Prothrombin Time: 17.4 s — ABNORMAL HIGH (ref 11.6–15.2)

## 2014-05-03 MED ORDER — OXYCODONE HCL 5 MG PO TABS
5.0000 mg | ORAL_TABLET | ORAL | Status: DC | PRN
  Administered 2014-05-03 – 2014-05-05 (×9): 15 mg via ORAL
  Filled 2014-05-03 (×9): qty 3

## 2014-05-03 MED ORDER — WARFARIN SODIUM 2.5 MG PO TABS
12.5000 mg | ORAL_TABLET | Freq: Once | ORAL | Status: AC
  Administered 2014-05-03: 12.5 mg via ORAL
  Filled 2014-05-03: qty 1

## 2014-05-03 MED ORDER — HYDROMORPHONE HCL 1 MG/ML IJ SOLN
0.5000 mg | INTRAMUSCULAR | Status: DC | PRN
  Administered 2014-05-03 – 2014-05-13 (×50): 1 mg via INTRAVENOUS
  Filled 2014-05-03 (×54): qty 1
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
## 2014-05-04 LAB — ANAEROBIC CULTURE

## 2014-05-04 LAB — PROTIME-INR
INR: 1.76 — AB (ref 0.00–1.49)
Prothrombin Time: 20.7 s — ABNORMAL HIGH (ref 11.6–15.2)

## 2014-05-04 MED ORDER — TRAMADOL HCL 50 MG PO TABS
50.0000 mg | ORAL_TABLET | Freq: Four times a day (QID) | ORAL | Status: DC
Start: 2014-05-04 — End: 2014-05-05
  Administered 2014-05-04 – 2014-05-05 (×3): 100 mg via ORAL
  Filled 2014-05-04 (×3): qty 2

## 2014-05-04 MED ORDER — METHOCARBAMOL 500 MG PO TABS
1000.0000 mg | ORAL_TABLET | Freq: Four times a day (QID) | ORAL | Status: DC
  Administered 2014-05-04 – 2014-05-13 (×36): 1000 mg via ORAL
  Filled 2014-05-04 (×55): qty 2

## 2014-05-04 MED ORDER — TRAMADOL HCL 50 MG PO TABS
50.0000 mg | ORAL_TABLET | Freq: Four times a day (QID) | ORAL | Status: DC | PRN
Start: 2014-05-04 — End: 2014-05-04

## 2014-05-04 MED ORDER — WARFARIN SODIUM 10 MG PO TABS
12.5000 mg | ORAL_TABLET | Freq: Once | ORAL | Status: AC
  Administered 2014-05-04: 12.5 mg via ORAL
  Filled 2014-05-04 (×2): qty 1
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
## 2014-05-04 NOTE — Progress Notes (Signed)
5 Days Post-Op Subjective: Patient complains of increased pain/pain management issues status post discontinuation of his PCA. There has been a distinct increase in JP drainage starting yesterday afternoon. No other significant clinical change. He put out approximately 800 mL from the drain on the last shift and has over 400 mL early in this shift. Drainage fluid is consistent with urine. Creatinine on the fluid remains consistent with urine.  Objective: Vital signs in last 24 hours: Temp:  [98.7 F (37.1 C)-100.7 F (38.2 C)] 99.1 F (37.3 C) (01/10 0510) Pulse Rate:  [79-103] 84 (01/10 0510) Resp:  [16-20] 17 (01/10 0510) BP: (141-161)/(81-103) 141/88 mmHg (01/10 0510) SpO2:  [100 %] 100 % (01/10 0510)  Intake/Output from previous day: 01/09 0701 - 01/10 0700 In: 1550 [P.O.:840; I.V.:160; IV Piggyback:550] Out: 4765 [Urine:3200; Drains:935] Intake/Output this shift: Total I/O In: -  Out: 950 [Urine:550; Drains:400]  Physical Exam:  Constitutional: Vital signs reviewed. WD WN in NAD   Eyes: PERRL, No scleral icterus.   Cardiovascular: RRR Pulmonary/Chest: Normal effort Abdominal: Soft. Genitourinary: SP tube draining clear urine. Foley catheter not draining Extremities: No cyanosis or edema   Lab Results:  Recent Labs  05/02/14 0500 05/03/14 0440  HGB 7.4* 7.4*  HCT 22.4* 22.3*   BMET  Recent Labs  05/02/14 0500 05/03/14 0440  NA 129* 128*  K 3.8 4.1  CL 98 97  CO2 25 24  GLUCOSE 157* 100*  BUN 7 6  CREATININE 0.59 0.61  CALCIUM 7.6* 8.0*    Recent Labs  05/02/14 0500 05/03/14 0440 05/04/14 0501  INR 1.17 1.41 1.76*   No results for input(s): LABURIN in the last 72 hours. Results for orders placed or performed during the hospital encounter of 04/11/14  MRSA PCR Screening     Status: None   Collection Time: 04/12/14  2:27 AM  Result Value Ref Range Status   MRSA by PCR NEGATIVE NEGATIVE Final    Comment:        The GeneXpert MRSA Assay  (FDA approved for NASAL specimens only), is one component of a comprehensive MRSA colonization surveillance program. It is not intended to diagnose MRSA infection nor to guide or monitor treatment for MRSA infections.   Urine culture     Status: None   Collection Time: 04/26/14 11:32 AM  Result Value Ref Range Status   Specimen Description URINE, SUPRAPUBIC  Final   Special Requests Normal  Final   Colony Count   Final    95,000 COLONIES/ML Performed at [facility] Performed at [facility]   Final   Report Status 04/27/2014 FINAL  Final  Anaerobic culture     Status: None   Collection Time: 04/29/14  1:35 PM  Result Value Ref Range Status   Specimen Description ABSCESS GROIN RIGHT  Final   Special Requests PT ON VANCOMYCIN  Final   Gram Stain   Final    RARE WBC PRESENT,BOTH PMN AND MONONUCLEAR NO SQUAMOUS EPITHELIAL CELLS SEEN NO ORGANISMS SEEN Performed at [facility]    Culture   Final    NO ANAEROBES ISOLATED Performed at [facility]    Report Status 05/04/2014 FINAL  Final  Culture, routine-abscess     Status: None   Collection Time: 04/29/14  1:35 PM  Result Value Ref Range Status   Specimen Description ABSCESS GROIN RIGHT  Final   Special Requests PT ON VANCOMYCIN  Final   Gram Stain  Final    RARE WBC PRESENT,BOTH PMN AND MONONUCLEAR NO SQUAMOUS EPITHELIAL CELLS SEEN NO ORGANISMS SEEN Performed at [facility]    Culture   Final    METHICILLIN RESISTANT STAPHYLOCOCCUS AUREUS Note: RIFAMPIN AND GENTAMICIN SHOULD NOT BE USED AS SINGLE DRUGS FOR TREATMENT OF STAPH INFECTIONS. This organism DOES NOT demonstrate inducible Clindamycin resistance in vitro. CRITICAL RESULT CALLED TO, READ BACK BY AND VERIFIED WITH: BRENDA HALL @  10:11AM 05/03/14 BY DWEEKS Performed at [facility]    Report Status 05/03/2014 FINAL  Final   Organism ID, Bacteria METHICILLIN RESISTANT STAPHYLOCOCCUS AUREUS   Final      Susceptibility   Methicillin resistant staphylococcus aureus - MIC*    CLINDAMYCIN <=0.25 SENSITIVE Sensitive     ERYTHROMYCIN >=8 RESISTANT Resistant     GENTAMICIN <=0.5 SENSITIVE Sensitive     LEVOFLOXACIN >=8 RESISTANT Resistant     OXACILLIN >=4 RESISTANT Resistant     PENICILLIN >=0.5 RESISTANT Resistant     RIFAMPIN <=0.5 SENSITIVE Sensitive     TRIMETH/SULFA <=10 SENSITIVE Sensitive     VANCOMYCIN <=0.5 SENSITIVE Sensitive     TETRACYCLINE <=1 SENSITIVE Sensitive     * METHICILLIN RESISTANT STAPHYLOCOCCUS AUREUS    Studies/Results: No results found.  Assessment/Plan:   Marked increase in JP fluid output with fluid creatinine consistent with ongoing urine leak. The patient had had substantial improvement in his JP output but now it is picked up dramatically. Previous imaging studies have failed to reveal any obvious ureteral leakage/ureteral injury but this is probably not been 100% ruled out. Leakage is more likely to be coming from ongoing bladder leakage. Patient may need bilateral percutaneous nephrostomy tubes to try to reduce urinary output through that bladder. Antegrade study could then be done of the left ureter. Dr. Ila Mcgill to reassess early next week.  Nothing required more urgently at this time.   LOS: 23 days   Kamelia Lampkins S 05/04/2014, 10:31 AM

## 2014-05-05 ENCOUNTER — Inpatient Hospital Stay (HOSPITAL_COMMUNITY): Payer: Self-pay

## 2014-05-05 DIAGNOSIS — N399 Disorder of urinary system, unspecified: Secondary | ICD-10-CM | POA: Insufficient documentation

## 2014-05-05 LAB — MAGNESIUM: Magnesium: 1.9 mg/dL (ref 1.5–2.5)

## 2014-05-05 LAB — DIFFERENTIAL
BASOS ABS: 0 10*3/uL (ref 0.0–0.1)
Basophils Relative: 0 % (ref 0–1)
EOS ABS: 0.2 10*3/uL (ref 0.0–0.7)
Eosinophils Relative: 2 % (ref 0–5)
Lymphocytes Relative: 9 % — ABNORMAL LOW (ref 12–46)
Lymphs Abs: 1 10*3/uL (ref 0.7–4.0)
MONO ABS: 1 10*3/uL (ref 0.1–1.0)
MONOS PCT: 9 % (ref 3–12)
Neutro Abs: 8.7 10*3/uL — ABNORMAL HIGH (ref 1.7–7.7)
Neutrophils Relative %: 80 % — ABNORMAL HIGH (ref 43–77)

## 2014-05-05 LAB — CBC
HCT: 21.9 % — ABNORMAL LOW (ref 39.0–52.0)
Hemoglobin: 7.2 g/dL — ABNORMAL LOW (ref 13.0–17.0)
MCH: 26.5 pg (ref 26.0–34.0)
MCHC: 32.9 g/dL (ref 30.0–36.0)
MCV: 80.5 fL (ref 78.0–100.0)
Platelets: 478 K/uL — ABNORMAL HIGH (ref 150–400)
RBC: 2.72 MIL/uL — ABNORMAL LOW (ref 4.22–5.81)
RDW: 16.6 % — ABNORMAL HIGH (ref 11.5–15.5)
WBC: 10.9 K/uL — ABNORMAL HIGH (ref 4.0–10.5)

## 2014-05-05 LAB — COMPREHENSIVE METABOLIC PANEL
ALT: 53 U/L (ref 0–53)
AST: 37 U/L (ref 0–37)
Albumin: 1.7 g/dL — ABNORMAL LOW (ref 3.5–5.2)
Alkaline Phosphatase: 149 U/L — ABNORMAL HIGH (ref 39–117)
Anion gap: 7 (ref 5–15)
BUN: 6 mg/dL (ref 6–23)
CO2: 25 mmol/L (ref 19–32)
Calcium: 8.2 mg/dL — ABNORMAL LOW (ref 8.4–10.5)
Chloride: 98 mEq/L (ref 96–112)
Creatinine, Ser: 0.6 mg/dL (ref 0.50–1.35)
GFR calc non Af Amer: >90 mL/min (ref >90)
Glucose, Bld: 89 mg/dL (ref 70–99)
Potassium: 3.9 mmol/L (ref 3.5–5.1)
SODIUM: 130 mmol/L — AB (ref 135–145)
TOTAL PROTEIN: 6.5 g/dL (ref 6.0–8.3)
Total Bilirubin: 0.7 mg/dL (ref 0.3–1.2)

## 2014-05-05 LAB — PREALBUMIN: Prealbumin: 8.5 mg/dL — ABNORMAL LOW (ref 17.0–34.0)

## 2014-05-05 LAB — PROTIME-INR
INR: 2.75 — AB (ref 0.00–1.49)
PROTHROMBIN TIME: 29.3 s — AB (ref 11.6–15.2)

## 2014-05-05 LAB — TRIGLYCERIDES: TRIGLYCERIDES: 172 mg/dL — AB (ref <150)

## 2014-05-05 LAB — PHOSPHORUS: Phosphorus: 4.4 mg/dL (ref 2.3–4.6)

## 2014-05-05 MED ORDER — TRAMADOL HCL 50 MG PO TABS
100.0000 mg | ORAL_TABLET | Freq: Four times a day (QID) | ORAL | Status: DC
  Administered 2014-05-05 – 2014-05-13 (×32): 100 mg via ORAL
  Filled 2014-05-05 (×32): qty 2

## 2014-05-05 MED ORDER — OXYCODONE HCL 5 MG PO TABS
10.0000 mg | ORAL_TABLET | ORAL | Status: DC | PRN
  Administered 2014-05-05 – 2014-05-10 (×28): 20 mg via ORAL
  Administered 2014-05-11: 15 mg via ORAL
  Administered 2014-05-11 – 2014-05-13 (×12): 20 mg via ORAL
  Filled 2014-05-05 (×4): qty 4
  Filled 2014-05-05 (×2): qty 2
  Filled 2014-05-05 (×17): qty 4
  Filled 2014-05-05: qty 2
  Filled 2014-05-05 (×5): qty 4
  Filled 2014-05-05: qty 3
  Filled 2014-05-05 (×9): qty 4
  Filled 2014-05-05: qty 2
  Filled 2014-05-05 (×3): qty 4

## 2014-05-05 MED ORDER — IOHEXOL 350 MG/ML SOLN
150.0000 mL | Freq: Once | INTRAVENOUS | Status: AC | PRN
  Administered 2014-05-05: 150 mL via INTRAVENOUS

## 2014-05-05 MED ORDER — MORPHINE SULFATE ER 30 MG PO TBCR
30.0000 mg | EXTENDED_RELEASE_TABLET | Freq: Two times a day (BID) | ORAL | Status: DC
  Administered 2014-05-05 (×2): 30 mg via ORAL
  Filled 2014-05-05 (×2): qty 1

## 2014-05-05 MED ORDER — ENOXAPARIN SODIUM 80 MG/0.8ML ~~LOC~~ SOLN
1.0000 mg/kg | Freq: Two times a day (BID) | SUBCUTANEOUS | Status: DC
  Administered 2014-05-06 – 2014-05-08 (×5): 80 mg via SUBCUTANEOUS
  Filled 2014-05-05 (×12): qty 0.8

## 2014-05-05 MED ORDER — VITAMIN K1 10 MG/ML IJ SOLN
10.0000 mg | Freq: Once | INTRAVENOUS | Status: AC
  Administered 2014-05-05: 10 mg via INTRAVENOUS
  Filled 2014-05-05: qty 1

## 2014-05-05 MED ORDER — CEFAZOLIN SODIUM-DEXTROSE 2-3 GM-% IV SOLR
2.0000 g | INTRAVENOUS | Status: AC
Start: 2014-05-06 — End: 2014-05-06
  Administered 2014-05-06: 2 g via INTRAVENOUS
  Filled 2014-05-05: qty 50
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
## 2014-05-05 NOTE — Progress Notes (Signed)
I reviewed chart and Dr Cy Blamer notes Dramatic change in urine drainage with majority of urine coming out of JP drain consistent with urine as noted Urethral foley has minimal output for many days Not having pain currently attributable to bladder- more incisional and rt hip Clinically not infected Drain and s/p tube in good position One could argue the foley might be a bit more distal but I could not palpate balloon in urethra Patient says tubes have not been pulled or changed position and he does appear quite particular about them Spent many minutes with him and his fiance He understands that something has changed and i need to reimage to look for leak/ureteral integrity Explained about perhaps foley/tube was pulled? Vs. Blast effect vs. Delayed ischemia etc Explained course of events up to this point with diminishing JP drainage Expained he may need perc tubes Understands that acute reconstructive surgery is not appropriate currently Will return today and draw picture post CT scan - will speak to mother hopefully today

## 2014-05-05 NOTE — Progress Notes (Signed)
Picture drawn and explained fully
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
## 2014-05-05 NOTE — Progress Notes (Signed)
Patient understands that ALL tubes will likely need to stay in for weeks/months Answered many questions Understands that a bladder injury usually heals around catheter Understands that a ureteral injury would need big operation in months JP drainage looks to be a lot less since this am and it is empty now Spoke with nursing and i think we should still proceed with plan  Clarified with pt bilateral vs unilateral Bit disappointed pt continues to say staff/docs are not being clear with him when i have spent a lot of time educating etc Continues to c/o pain management

## 2014-05-06 ENCOUNTER — Inpatient Hospital Stay (HOSPITAL_COMMUNITY): Payer: Self-pay

## 2014-05-06 ENCOUNTER — Inpatient Hospital Stay (HOSPITAL_COMMUNITY): Payer: Self-pay | Admitting: Anesthesiology

## 2014-05-06 ENCOUNTER — Encounter (HOSPITAL_COMMUNITY): Payer: Self-pay | Admitting: Certified Registered"

## 2014-05-06 ENCOUNTER — Encounter (HOSPITAL_COMMUNITY): Admission: EM | Disposition: A | Discharge status: Home Health | Payer: Self-pay | Source: Home / Self Care

## 2014-05-06 HISTORY — PX: RADIOLOGY WITH ANESTHESIA: SHX6223

## 2014-05-06 LAB — PROTIME-INR
INR: 1.34 (ref 0.00–1.49)
PROTHROMBIN TIME: 16.8 s — AB (ref 11.6–15.2)

## 2014-05-06 LAB — URINE CULTURE
Colony Count: NO GROWTH
Culture: NO GROWTH

## 2014-05-06 LAB — TYPE AND SCREEN
ABO/RH(D): B POS
ANTIBODY SCREEN: NEGATIVE

## 2014-05-06 SURGERY — RADIOLOGY WITH ANESTHESIA
Anesthesia: General | Laterality: Bilateral

## 2014-05-06 MED ORDER — FENTANYL CITRATE 0.05 MG/ML IJ SOLN
INTRAMUSCULAR | Status: AC
  Filled 2014-05-06: qty 4

## 2014-05-06 MED ORDER — GLYCOPYRROLATE 0.2 MG/ML IJ SOLN
INTRAMUSCULAR | Status: DC | PRN
  Administered 2014-05-06: .8 mg via INTRAVENOUS

## 2014-05-06 MED ORDER — MIDAZOLAM HCL 2 MG/2ML IJ SOLN
INTRAMUSCULAR | Status: AC
  Filled 2014-05-06: qty 4

## 2014-05-06 MED ORDER — METHOCARBAMOL 500 MG PO TABS
ORAL_TABLET | ORAL | Status: AC
  Administered 2014-05-06: 1000 mg via ORAL
  Filled 2014-05-06: qty 2

## 2014-05-06 MED ORDER — HYDROMORPHONE HCL 1 MG/ML IJ SOLN
1.5000 mg | Freq: Once | INTRAMUSCULAR | Status: AC
  Administered 2014-05-06: 1.5 mg via INTRAVENOUS

## 2014-05-06 MED ORDER — OXYCODONE HCL 5 MG PO TABS
ORAL_TABLET | ORAL | Status: AC
Start: 2014-05-06 — End: 2014-05-06
  Administered 2014-05-06: 20 mg via ORAL
  Filled 2014-05-06: qty 4

## 2014-05-06 MED ORDER — OXYCODONE HCL 5 MG PO TABS: 5.0000 mg | ORAL_TABLET | Freq: Once | ORAL | Status: DC | PRN

## 2014-05-06 MED ORDER — ONDANSETRON HCL 4 MG/2ML IJ SOLN
INTRAMUSCULAR | Status: AC
  Filled 2014-05-06: qty 2

## 2014-05-06 MED ORDER — ROCURONIUM BROMIDE 100 MG/10ML IV SOLN
INTRAVENOUS | Status: DC | PRN
  Administered 2014-05-06 (×2): 10 mg via INTRAVENOUS
  Administered 2014-05-06: 15 mg via INTRAVENOUS
  Administered 2014-05-06: 10 mg via INTRAVENOUS
  Administered 2014-05-06: 35 mg via INTRAVENOUS

## 2014-05-06 MED ORDER — OXYCODONE HCL 5 MG/5ML PO SOLN: 5.0000 mg | Freq: Once | ORAL | Status: DC | PRN

## 2014-05-06 MED ORDER — HYDROMORPHONE HCL 1 MG/ML IJ SOLN
INTRAMUSCULAR | Status: AC
  Administered 2014-05-06: 0.5 mg via INTRAVENOUS
  Filled 2014-05-06: qty 2

## 2014-05-06 MED ORDER — CEFAZOLIN SODIUM-DEXTROSE 2-3 GM-% IV SOLR
INTRAVENOUS | Status: AC
  Administered 2014-05-06: 2 g via INTRAVENOUS
  Filled 2014-05-06: qty 50

## 2014-05-06 MED ORDER — FENTANYL CITRATE 0.05 MG/ML IJ SOLN
INTRAMUSCULAR | Status: AC | PRN
  Administered 2014-05-06 (×2): 50 ug via INTRAVENOUS

## 2014-05-06 MED ORDER — LIDOCAINE HCL (CARDIAC) 20 MG/ML IV SOLN
INTRAVENOUS | Status: DC | PRN
  Administered 2014-05-06: 50 mg via INTRAVENOUS

## 2014-05-06 MED ORDER — PROPOFOL 10 MG/ML IV BOLUS
INTRAVENOUS | Status: DC | PRN
  Administered 2014-05-06: 100 mg via INTRAVENOUS
  Administered 2014-05-06: 50 mg via INTRAVENOUS

## 2014-05-06 MED ORDER — HYDROMORPHONE HCL 1 MG/ML IJ SOLN
0.5000 mg | INTRAMUSCULAR | Status: AC | PRN
  Administered 2014-05-06 (×4): 0.5 mg via INTRAVENOUS

## 2014-05-06 MED ORDER — IOHEXOL 300 MG/ML  SOLN
100.0000 mL | Freq: Once | INTRAMUSCULAR | Status: AC | PRN
  Administered 2014-05-06: 180 mL

## 2014-05-06 MED ORDER — PHENYLEPHRINE HCL 10 MG/ML IJ SOLN
INTRAMUSCULAR | Status: DC | PRN
  Administered 2014-05-06: 120 ug via INTRAVENOUS
  Administered 2014-05-06: 80 ug via INTRAVENOUS

## 2014-05-06 MED ORDER — NEOSTIGMINE METHYLSULFATE 10 MG/10ML IV SOLN
INTRAVENOUS | Status: DC | PRN
  Administered 2014-05-06: 5 mg via INTRAVENOUS

## 2014-05-06 MED ORDER — MIDAZOLAM HCL 5 MG/5ML IJ SOLN
INTRAMUSCULAR | Status: DC | PRN
  Administered 2014-05-06: 2 mg via INTRAVENOUS

## 2014-05-06 MED ORDER — SODIUM CHLORIDE 0.9 % IV SOLN
INTRAVENOUS | Status: AC | PRN
  Administered 2014-05-06: 1000 mL via INTRAVENOUS

## 2014-05-06 MED ORDER — MORPHINE SULFATE ER 30 MG PO TBCR
45.0000 mg | EXTENDED_RELEASE_TABLET | Freq: Two times a day (BID) | ORAL | Status: DC
  Administered 2014-05-06 – 2014-05-07 (×2): 45 mg via ORAL
  Filled 2014-05-06 (×4): qty 1

## 2014-05-06 MED ORDER — ONDANSETRON HCL 4 MG/2ML IJ SOLN
INTRAMUSCULAR | Status: DC | PRN
  Administered 2014-05-06: 4 mg via INTRAVENOUS

## 2014-05-06 MED ORDER — SODIUM CHLORIDE 0.9 % IV SOLN
INTRAVENOUS | Status: DC | PRN
  Administered 2014-05-06 (×2): via INTRAVENOUS

## 2014-05-06 MED ORDER — FENTANYL CITRATE 0.05 MG/ML IJ SOLN
INTRAMUSCULAR | Status: DC | PRN
  Administered 2014-05-06 (×4): 50 ug via INTRAVENOUS
  Administered 2014-05-06 (×2): 100 ug via INTRAVENOUS

## 2014-05-06 MED ORDER — MIDAZOLAM HCL 2 MG/2ML IJ SOLN
INTRAMUSCULAR | Status: AC | PRN
  Administered 2014-05-06: 2 mg via INTRAVENOUS

## 2014-05-06 NOTE — Sedation Documentation (Signed)
Given additional 50 mcg of Fentanyl under direction of Dr Laurence Ferrari as patient crying with pain esp right hip
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
## 2014-05-06 NOTE — Progress Notes (Signed)
UR completed.  Pt agreeable to CIR as discharge plan.   Sandi Mariscal, RN BSN [place] CCM Trauma/Neuro ICU Case Manager [phone]

## 2014-05-06 NOTE — Procedures (Signed)
Interventional Radiology Procedure Note  Procedure: Successful placement of bilateral 35F percutaneous nephrostomy tubes.  Limited LEFT nephroureterogram shows a distal left ureteral injury.   Complications: None immediate Recommendations: - Keep tubes to gravity drainage - Monitor output and hematuria  Signed,  Criselda Peaches, MD

## 2014-05-06 NOTE — Sedation Documentation (Signed)
Unable to position on right side due to pain. Patient crying. Dr Laurence Ferrari present and has decided to do case under anesthesia.

## 2014-05-06 NOTE — Sedation Documentation (Signed)
VS stable after runs of VT. Patient remained alert throughout.

## 2014-05-06 NOTE — Transfer of Care (Signed)
Immediate Anesthesia Transfer of Care Note  Patient: Carlos Lawrence  Procedure(s) Performed: Procedure(s): RADIOLOGY WITH ANESTHESIA (Bilateral)  Patient Location: PACU  Anesthesia Type:General  Level of Consciousness: awake and oriented  Airway & Oxygen Therapy: Patient Spontanous Breathing and Patient connected to nasal cannula oxygen  Post-op Assessment: Report given to PACU RN  Post vital signs: Reviewed and stable  Complications: No apparent anesthesia complications
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
## 2014-05-06 NOTE — Progress Notes (Signed)
Occupational Therapy Cancellation Note  Pt gone for surgery.  Will reattempt.   Lucille Passy, OTR/L [phone]

## 2014-05-06 NOTE — Sedation Documentation (Signed)
Trying to get patient positioned on stomach, despite sedation patient having too much pain, crying moaning. Had approx a 7 beat run of VT and then 20 beat run. Dr Jeannine Kitten aware. Will try to position on left side to do left nephrostomy tube 1st.

## 2014-05-06 NOTE — Anesthesia Procedure Notes (Signed)
Procedure Name: Intubation Date/Time: 05/06/2014 1:07 PM Performed by: Sampson Si E Pre-anesthesia Checklist: Patient identified, Emergency Drugs available, Suction available, Patient being monitored and Timeout performed Patient Re-evaluated:Patient Re-evaluated prior to inductionOxygen Delivery Method: Circle system utilized Preoxygenation: Pre-oxygenation with 100% oxygen Intubation Type: IV induction Ventilation: Mask ventilation without difficulty Laryngoscope Size: Mac and 4 Grade View: Grade I Tube type: Oral Tube size: 7.5 mm Number of attempts: 1 Airway Equipment and Method: Stylet Placement Confirmation: ETT inserted through vocal cords under direct vision,  positive ETCO2 and breath sounds checked- equal and bilateral Secured at: 22 cm Tube secured with: Tape Dental Injury: Teeth and Oropharynx as per pre-operative assessment

## 2014-05-07 ENCOUNTER — Encounter (HOSPITAL_COMMUNITY): Payer: Self-pay | Admitting: Interventional Radiology

## 2014-05-07 DIAGNOSIS — S3710XA Unspecified injury of ureter, initial encounter: Secondary | ICD-10-CM | POA: Diagnosis present

## 2014-05-07 LAB — BASIC METABOLIC PANEL
ANION GAP: 4 — AB (ref 5–15)
BUN: <5 mg/dL — ABNORMAL LOW (ref 6–23)
CALCIUM: 8.2 mg/dL — AB (ref 8.4–10.5)
CHLORIDE: 98 meq/L (ref 96–112)
CO2: 27 mmol/L (ref 19–32)
Creatinine, Ser: 0.74 mg/dL (ref 0.50–1.35)
GFR calc Af Amer: >90 mL/min (ref >90)
GFR calc non Af Amer: >90 mL/min (ref >90)
Glucose, Bld: 108 mg/dL — ABNORMAL HIGH (ref 70–99)
Potassium: 3.9 mmol/L (ref 3.5–5.1)
SODIUM: 129 mmol/L — AB (ref 135–145)

## 2014-05-07 LAB — CBC
HCT: 22.6 % — ABNORMAL LOW (ref 39.0–52.0)
Hemoglobin: 7.4 g/dL — ABNORMAL LOW (ref 13.0–17.0)
MCH: 26.3 pg (ref 26.0–34.0)
MCHC: 32.7 g/dL (ref 30.0–36.0)
MCV: 80.4 fL (ref 78.0–100.0)
PLATELETS: 519 10*3/uL — AB (ref 150–400)
RBC: 2.81 MIL/uL — AB (ref 4.22–5.81)
RDW: 17.1 % — AB (ref 11.5–15.5)
WBC: 10.9 10*3/uL — AB (ref 4.0–10.5)

## 2014-05-07 LAB — VANCOMYCIN, TROUGH: VANCOMYCIN TR: 12 ug/mL (ref 10.0–20.0)

## 2014-05-07 LAB — PROTIME-INR
INR: 1.31 (ref 0.00–1.49)
Prothrombin Time: 16.4 s — ABNORMAL HIGH (ref 11.6–15.2)

## 2014-05-07 MED ORDER — MORPHINE SULFATE ER 30 MG PO TBCR
60.0000 mg | EXTENDED_RELEASE_TABLET | Freq: Two times a day (BID) | ORAL | Status: DC
Start: 2014-05-07 — End: 2014-05-08
  Administered 2014-05-07 (×2): 60 mg via ORAL
  Filled 2014-05-07 (×2): qty 2

## 2014-05-07 MED ORDER — WARFARIN SODIUM 2.5 MG PO TABS
12.5000 mg | ORAL_TABLET | Freq: Once | ORAL | Status: AC
  Administered 2014-05-07: 12.5 mg via ORAL
  Filled 2014-05-07: qty 1

## 2014-05-07 MED ORDER — MORPHINE SULFATE ER 15 MG PO TBCR
EXTENDED_RELEASE_TABLET | ORAL | Status: AC
  Filled 2014-05-07: qty 3

## 2014-05-07 MED ORDER — DIAZEPAM 5 MG PO TABS
10.0000 mg | ORAL_TABLET | Freq: Three times a day (TID) | ORAL | Status: DC
  Administered 2014-05-07 – 2014-05-13 (×19): 10 mg via ORAL
  Filled 2014-05-07 (×19): qty 2
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
## 2014-05-07 NOTE — Addendum Note (Signed)
Addendum  created 05/07/14 1407 by Laurie Panda, MD   Modules edited: Anesthesia Responsible Staff

## 2014-05-07 NOTE — Anesthesia Postprocedure Evaluation (Signed)
  Anesthesia Post-op Note  Patient: Carlos Lawrence  Procedure(s) Performed: Procedure(s): RADIOLOGY WITH ANESTHESIA (Bilateral)  Patient Location: PACU  Anesthesia Type:General  Level of Consciousness: awake and alert   Airway and Oxygen Therapy: Patient Spontanous Breathing and Patient connected to nasal cannula oxygen  Post-op Pain: mild  Post-op Assessment: Post-op Vital signs reviewed, Patient's Cardiovascular Status Stable, Respiratory Function Stable, Patent Airway, No signs of Nausea or vomiting and Pain level controlled  Post-op Vital Signs: Reviewed and stable  Last Vitals:  Filed Vitals:   05/07/14 0515  BP: 145/89  Pulse: 94  Temp: 37.2 C  Resp: 15    Complications: No apparent anesthesia complications

## 2014-05-07 NOTE — Progress Notes (Signed)
PT Cancellation Note  Patient Details Name: Natnael Biederman MRN: [id number] DOB: December 15, 1985   Cancelled Treatment:    Reason Eval/Treat Not Completed: Pain limiting ability to participate Holding PT tx today as pt just finished working with OT and having increased pain in back. Will follow up next available time.   Candy Sledge A 05/07/2014, 3:02 PM  Candy Sledge, [place], DPT [phone]

## 2014-05-07 NOTE — Consult Note (Signed)
WOC ostomy consult note Trauma team following for assessment and plan of care to abd wound. Stoma type/location: Colostomy stoma to LLQ Stomal assessment/size: Stoma red and viable, flush with skin level, 1 1/2 inches Peristomal assessment: Intact skin surrounding Output 50cc liquid brown stool in pouch Ostomy pouching: 1pc Education provided: Pt states he assisted with application of one piece pouch and barrier ring to maintain seal yesterday.He plans to transfer to [facility] and feels comfortable with pouch application and emptying. Supplies at bedside for pt or staff nurse use. Julien Girt MSN, RN, [place], [place], [place]
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
## 2014-05-08 ENCOUNTER — Inpatient Hospital Stay (HOSPITAL_COMMUNITY): Payer: Self-pay

## 2014-05-08 LAB — PROTIME-INR
INR: 1.5 — AB (ref 0.00–1.49)
Prothrombin Time: 18.3 s — ABNORMAL HIGH (ref 11.6–15.2)

## 2014-05-08 MED ORDER — WARFARIN SODIUM 10 MG PO TABS
10.0000 mg | ORAL_TABLET | Freq: Once | ORAL | Status: AC
  Administered 2014-05-08: 10 mg via ORAL
  Filled 2014-05-08: qty 1

## 2014-05-08 MED ORDER — HYDROMORPHONE HCL 1 MG/ML IJ SOLN
1.0000 mg | Freq: Once | INTRAMUSCULAR | Status: AC
  Administered 2014-05-08: 1 mg via INTRAVENOUS

## 2014-05-08 MED ORDER — MORPHINE SULFATE ER 30 MG PO TBCR
75.0000 mg | EXTENDED_RELEASE_TABLET | Freq: Two times a day (BID) | ORAL | Status: DC
  Administered 2014-05-08 (×2): 75 mg via ORAL
  Filled 2014-05-08 (×2): qty 2
  Filled 2014-05-08 (×2): qty 1
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
## 2014-05-09 LAB — BASIC METABOLIC PANEL
Anion gap: 9 (ref 5–15)
BUN: <5 mg/dL — ABNORMAL LOW (ref 6–23)
CALCIUM: 8.6 mg/dL (ref 8.4–10.5)
CHLORIDE: 98 meq/L (ref 96–112)
CO2: 27 mmol/L (ref 19–32)
CREATININE: 0.64 mg/dL (ref 0.50–1.35)
Glucose, Bld: 109 mg/dL — ABNORMAL HIGH (ref 70–99)
POTASSIUM: 3.7 mmol/L (ref 3.5–5.1)
Sodium: 134 mmol/L — ABNORMAL LOW (ref 135–145)

## 2014-05-09 LAB — CBC
HCT: 21.8 % — ABNORMAL LOW (ref 39.0–52.0)
HEMOGLOBIN: 7.2 g/dL — AB (ref 13.0–17.0)
MCH: 26.6 pg (ref 26.0–34.0)
MCHC: 33 g/dL (ref 30.0–36.0)
MCV: 80.4 fL (ref 78.0–100.0)
PLATELETS: 472 10*3/uL — AB (ref 150–400)
RBC: 2.71 MIL/uL — AB (ref 4.22–5.81)
RDW: 17.2 % — AB (ref 11.5–15.5)
WBC: 8.3 10*3/uL (ref 4.0–10.5)

## 2014-05-09 LAB — PROTIME-INR
INR: 1.79 — ABNORMAL HIGH (ref 0.00–1.49)
Prothrombin Time: 21 seconds — ABNORMAL HIGH (ref 11.6–15.2)

## 2014-05-09 MED ORDER — HYDROMORPHONE HCL 1 MG/ML IJ SOLN
1.0000 mg | Freq: Once | INTRAMUSCULAR | Status: AC
  Administered 2014-05-09: 1 mg via INTRAVENOUS
  Filled 2014-05-09: qty 1

## 2014-05-09 MED ORDER — ENOXAPARIN SODIUM 80 MG/0.8ML ~~LOC~~ SOLN
1.0000 mg/kg | Freq: Two times a day (BID) | SUBCUTANEOUS | Status: DC
  Administered 2014-05-09 – 2014-05-10 (×4): 80 mg via SUBCUTANEOUS
  Filled 2014-05-09 (×5): qty 0.8

## 2014-05-09 MED ORDER — CEFAZOLIN SODIUM-DEXTROSE 2-3 GM-% IV SOLR
2.0000 g | INTRAVENOUS | Status: AC
  Administered 2014-05-12: 2 g via INTRAVENOUS
  Filled 2014-05-09: qty 50

## 2014-05-09 MED ORDER — MORPHINE SULFATE ER 100 MG PO TBCR
100.0000 mg | EXTENDED_RELEASE_TABLET | Freq: Two times a day (BID) | ORAL | Status: DC
  Administered 2014-05-09 – 2014-05-11 (×5): 100 mg via ORAL
  Filled 2014-05-09 (×5): qty 1

## 2014-05-09 MED ORDER — WARFARIN SODIUM 7.5 MG PO TABS
7.5000 mg | ORAL_TABLET | Freq: Once | ORAL | Status: AC
  Administered 2014-05-09: 7.5 mg via ORAL
  Filled 2014-05-09: qty 1

## 2014-05-09 MED ORDER — MORPHINE SULFATE ER 100 MG PO TBCR
100.0000 mg | EXTENDED_RELEASE_TABLET | Freq: Two times a day (BID) | ORAL | Status: DC
  Administered 2014-05-09: 100 mg via ORAL
  Filled 2014-05-09: qty 1
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
## 2014-05-09 NOTE — Progress Notes (Signed)
UR completed.  Claudene Gatliff, RN BSN [facility] CCM Trauma/Neuro ICU Case Manager [phone]  
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
## 2014-05-09 NOTE — Progress Notes (Signed)
Reviewed notes Plan for xrays etc on Monday JP drain minimal
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
## 2014-05-10 LAB — PROTIME-INR
INR: 2.11 — ABNORMAL HIGH (ref 0.00–1.49)
Prothrombin Time: 23.8 s — ABNORMAL HIGH (ref 11.6–15.2)

## 2014-05-10 MED ORDER — WARFARIN SODIUM 10 MG PO TABS
10.0000 mg | ORAL_TABLET | Freq: Once | ORAL | Status: AC
  Administered 2014-05-10: 10 mg via ORAL
  Filled 2014-05-10: qty 1

## 2014-05-10 MED ORDER — HYDROMORPHONE HCL 1 MG/ML IJ SOLN
1.0000 mg | Freq: Once | INTRAMUSCULAR | Status: AC
Start: 2014-05-10 — End: 2014-05-10
  Administered 2014-05-10: 1 mg via INTRAVENOUS
  Filled 2014-05-10: qty 1
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
## 2014-05-10 NOTE — Progress Notes (Signed)
Patient ID: Carlos Lawrence, male   DOB: 1985/08/02, 29 y.o.   MRN: [id number] Patient seen at bedside  Patient's right hand looks excellent.  He understands to use his splint when he is active.  Otherwise he can be out of the splint.  His wound is healing nicely there is no comp cutting features. I would expect some loss of extension but his flexion looks great. We'll watch him at a distance at this juncture as he is quite stable  Lonie Newsham M.D.

## 2014-05-11 DIAGNOSIS — Z9889 Other specified postprocedural states: Secondary | ICD-10-CM

## 2014-05-11 LAB — PROTIME-INR
INR: 2.14 — AB (ref 0.00–1.49)
PROTHROMBIN TIME: 24.1 s — AB (ref 11.6–15.2)

## 2014-05-11 MED ORDER — BISACODYL 10 MG RE SUPP
10.0000 mg | Freq: Once | RECTAL | Status: AC
  Administered 2014-05-11: 10 mg via RECTAL
  Filled 2014-05-11: qty 1

## 2014-05-11 NOTE — Progress Notes (Signed)
VASCULAR LAB PRELIMINARY  PRELIMINARY  PRELIMINARY  PRELIMINARY  Right lower extremity venous duplex completed.    Preliminary report:  Right:  Right CFV partially compresses.  Patient guarding from pain may interfere with complete coaptation of the vein.  There is complete color fill and respiratory phasicity.  All other veins patent with complete compressibility.  Kendell Sagraves, RVT 05/11/2014, 2:16 PM
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
## 2014-05-11 NOTE — Progress Notes (Signed)
Patient ID: Carlos Lawrence, male   DOB: December 20, 1985, 29 y.o.   MRN: [id number] 5 Days Post-Op  Subjective: Carlos Lawrence is doing well today.   He is afebrile.  His JP drainage has stopped and he has good output from the nephrostomy tube and SP tube.   He is for a cystogram tomorrow and hopefully will be able to get rid of the foley.   He is also for a left nephrostogram and possible antegrade stent placement.  ROS:  Review of Systems  Constitutional: Negative for fever.  Genitourinary: Negative for hematuria.    Anti-infectives: Anti-infectives    Start     Dose/Rate Route Frequency Ordered Stop   05/12/14 0000  ceFAZolin (ANCEF) IVPB 2 g/50 mL premix    Comments:  Hang ON CALL to xray 1/18   2 g100 mL/hr over 30 Minutes Intravenous On call 05/09/14 0919 05/13/14 0000   05/06/14 0600  ceFAZolin (ANCEF) IVPB 2 g/50 mL premix     2 g100 mL/hr over 30 Minutes Intravenous On call 05/05/14 1539 05/06/14 1033   05/02/14 2300  vancomycin (VANCOCIN) 1,750 mg in sodium chloride 0.9 % 500 mL IVPB     1,750 mg250 mL/hr over 120 Minutes Intravenous Every 12 hours 05/02/14 2212     04/28/14 1000  vancomycin (VANCOCIN) 1,500 mg in sodium chloride 0.9 % 500 mL IVPB  Status:  Discontinued     1,500 mg250 mL/hr over 120 Minutes Intravenous Every 12 hours 04/28/14 0838 05/02/14 2215   04/28/14 1000  fluconazole (DIFLUCAN) IVPB 400 mg  Status:  Discontinued     400 mg100 mL/hr over 120 Minutes Intravenous Every 24 hours 04/28/14 0838 05/04/14 1547   04/28/14 0930  ceFEPIme (MAXIPIME) 1 g in dextrose 5 % 50 mL IVPB  Status:  Discontinued     1 g100 mL/hr over 30 Minutes Intravenous Every 8 hours 04/28/14 0838 05/04/14 1547   04/14/14 0900  vancomycin (VANCOCIN) IVPB 1000 mg/200 mL premix  Status:  Discontinued     1,000 mg200 mL/hr over 60 Minutes Intravenous Every 12 hours 04/14/14 0847 04/16/14 0814   04/13/14 2200  vancomycin (VANCOCIN) IVPB 750 mg/150 ml premix  Status:  Discontinued     750 mg150 mL/hr  over 60 Minutes Intravenous Every 12 hours 04/13/14 1428 04/14/14 0846   04/12/14 2300  piperacillin-tazobactam (ZOSYN) IVPB 3.375 g  Status:  Discontinued     3.375 g12.5 mL/hr over 240 Minutes Intravenous 3 times per day 04/12/14 2117 04/16/14 0814   04/12/14 2200  vancomycin (VANCOCIN) IVPB 1000 mg/200 mL premix  Status:  Discontinued     1,000 mg200 mL/hr over 60 Minutes Intravenous Every 12 hours 04/12/14 2117 04/13/14 1428   04/12/14 1130  ciprofloxacin (CIPRO) IVPB 400 mg     400 mg200 mL/hr over 60 Minutes Intravenous On call 04/12/14 1059 04/12/14 1240      Current Facility-Administered Medications  Medication Dose Route Frequency Provider Last Rate Last Dose  . acetaminophen (TYLENOL) tablet 650 mg  650 mg Oral Q4H PRN Rolm Bookbinder, MD   650 mg at 05/06/14 1050  . bacitracin ointment   Topical BID [name], PA-C   1 application at 77/41/28 7867  . calcium carbonate (OS-CAL - dosed in mg of elemental calcium) tablet 500 mg of elemental calcium  1 tablet Oral BID WC [name], PA-C   500 mg of elemental calcium at 05/11/14 0926  . [START ON 05/12/2014] ceFAZolin (ANCEF) IVPB 2 g/50 mL premix  2 g Intravenous On Call Lavonia Drafts, PA-C      . chlorproMAZINE (THORAZINE) 12.5 mg in sodium chloride 0.9 % 25 mL IVPB  12.5 mg Intravenous Q6H PRN Erroll Luna, MD   12.5 mg at 04/28/14 1732  . diazepam (VALIUM) tablet 10 mg  10 mg Oral TID [name], PA-C   10 mg at 05/11/14 7026  . docusate sodium (COLACE) capsule 200 mg  200 mg Oral BID [name], PA-C   200 mg at 05/11/14 3785  . feeding supplement (ENSURE COMPLETE) (ENSURE COMPLETE) liquid 237 mL  237 mL Oral BID BM Jenifer A Williams, RD   237 mL at 05/11/14 0923  . hydrALAZINE (APRESOLINE) injection 10 mg  10 mg Intravenous Q6H PRN Georganna Skeans, MD   10 mg at 04/29/14 1852  . HYDROmorphone (DILAUDID) injection 0.5-1 mg  0.5-1 mg Intravenous Q4H PRN Megan N Dort, PA-C   1 mg at 05/11/14 1055  .  methocarbamol (ROBAXIN) tablet 1,000 mg  1,000 mg Oral QID Nehemiah Massed Dort, PA-C   1,000 mg at 05/11/14 8850  . morphine (MS CONTIN) 12 hr tablet 100 mg  100 mg Oral Q12H Tad Moore, RPH   100 mg at 05/11/14 2774  . ondansetron (ZOFRAN) tablet 4 mg  4 mg Oral Q6H PRN Rolm Bookbinder, MD       Or  . ondansetron [facility]) injection 4 mg  4 mg Intravenous Q6H PRN Rolm Bookbinder, MD   4 mg at 04/26/14 1952  . oxyCODONE (Oxy IR/ROXICODONE) immediate release tablet 10-20 mg  10-20 mg Oral Q4H PRN [name], PA-C   20 mg at 05/11/14 1287  . polyethylene glycol (MIRALAX / GLYCOLAX) packet 17 g  17 g Oral Daily [name], PA-C   17 g at 05/08/14 1041  . sodium chloride 0.9 % injection 10-40 mL  10-40 mL Intracatheter PRN Doreen Salvage, MD   10 mL at 05/10/14 1754  . sodium chloride tablet 2 g  2 g Oral TID WC Stark Klein, MD   2 g at 05/11/14 0900  . traMADol (ULTRAM) tablet 100 mg  100 mg Oral 4 times per day [name], PA-C   100 mg at 05/11/14 8676  . vancomycin (VANCOCIN) 1,750 mg in sodium chloride 0.9 % 500 mL IVPB  1,750 mg Intravenous Q12H Arman Bogus, RPH   1,750 mg at 05/11/14 1055  . Warfarin - Pharmacist Dosing Inpatient   Does not apply q1800 Beverlee Nims, [place] at 05/11/14 1800     Objective: Vital signs in last 24 hours: Temp:  [98.4 F (36.9 C)-99.3 F (37.4 C)] 98.4 F (36.9 C) (01/17 0636) Pulse Rate:  [95-108] 95 (01/17 0636) Resp:  [15-16] 16 (01/17 0636) BP: (142-151)/(87-93) 142/93 mmHg (01/17 0636) SpO2:  [99 %-100 %] 100 % (01/17 0636)  Intake/Output from previous day: 01/16 0701 - 01/17 0700 In: 1544 [P.O.:844; I.V.:200; IV Piggyback:500] Out: 3750 [Urine:3750] Intake/Output this shift: Total I/O In: -  Out: 350 [Urine:350]   Physical Exam  Constitutional: He is well-developed, well-nourished, and in no distress.  Vitals reviewed.   Lab Results:   Recent Labs  05/09/14 0600  WBC 8.3  HGB 7.2*  HCT 21.8*   PLT 472*   BMET  Recent Labs  05/09/14 0600  NA 134*  K 3.7  CL 98  CO2 27  GLUCOSE 109*  BUN <5*  CREATININE 0.64  CALCIUM 8.6  PT/INR  Recent Labs  05/10/14 0520 05/11/14 0636  LABPROT 23.8* 24.1*  INR 2.11* 2.14*   ABG No results for input(s): PHART, HCO3 in the last 72 hours.  Invalid input(s): PCO2, PO2  Studies/Results: No results found.   Assessment: s/p Procedure(s): RADIOLOGY WITH ANESTHESIA  Doing well on current therapy.   Plan: Cystogram and left nephrostogram tomorrow.      LOS: 30 days    Cortez Flippen J 05/11/2014
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
## 2014-05-12 ENCOUNTER — Encounter (HOSPITAL_COMMUNITY): Admission: EM | Disposition: A | Discharge status: Home Health | Payer: Self-pay | Source: Home / Self Care

## 2014-05-12 ENCOUNTER — Other Ambulatory Visit (HOSPITAL_COMMUNITY): Payer: Self-pay

## 2014-05-12 ENCOUNTER — Inpatient Hospital Stay (HOSPITAL_COMMUNITY): Payer: Self-pay | Admitting: Anesthesiology

## 2014-05-12 ENCOUNTER — Inpatient Hospital Stay (HOSPITAL_COMMUNITY): Payer: Self-pay

## 2014-05-12 HISTORY — PX: RADIOLOGY WITH ANESTHESIA: SHX6223

## 2014-05-12 LAB — CBC
HCT: 23.8 % — ABNORMAL LOW (ref 39.0–52.0)
Hemoglobin: 7.9 g/dL — ABNORMAL LOW (ref 13.0–17.0)
MCH: 26.3 pg (ref 26.0–34.0)
MCHC: 33.2 g/dL (ref 30.0–36.0)
MCV: 79.3 fL (ref 78.0–100.0)
Platelets: 464 10*3/uL — ABNORMAL HIGH (ref 150–400)
RBC: 3 MIL/uL — AB (ref 4.22–5.81)
RDW: 17.5 % — AB (ref 11.5–15.5)
WBC: 6.9 10*3/uL (ref 4.0–10.5)

## 2014-05-12 LAB — PROTIME-INR
INR: 2.07 — AB (ref 0.00–1.49)
Prothrombin Time: 23.4 s — ABNORMAL HIGH (ref 11.6–15.2)

## 2014-05-12 LAB — BASIC METABOLIC PANEL
Anion gap: 9 (ref 5–15)
BUN: <5 mg/dL — ABNORMAL LOW (ref 6–23)
CALCIUM: 8.9 mg/dL (ref 8.4–10.5)
CHLORIDE: 100 meq/L (ref 96–112)
CO2: 25 mmol/L (ref 19–32)
CREATININE: 0.67 mg/dL (ref 0.50–1.35)
GFR calc non Af Amer: >90 mL/min (ref >90)
Glucose, Bld: 90 mg/dL (ref 70–99)
POTASSIUM: 4 mmol/L (ref 3.5–5.1)
SODIUM: 134 mmol/L — AB (ref 135–145)

## 2014-05-12 LAB — APTT: APTT: 65 s — AB (ref 24–37)

## 2014-05-12 LAB — CREATININE, FLUID (PLEURAL, PERITONEAL, JP DRAINAGE): CREAT FL: 32.9 mg/dL

## 2014-05-12 SURGERY — RADIOLOGY WITH ANESTHESIA
Anesthesia: General | Laterality: Left

## 2014-05-12 MED ORDER — IOHEXOL 300 MG/ML  SOLN
150.0000 mL | Freq: Once | INTRAMUSCULAR | Status: AC | PRN
  Administered 2014-05-12: 100 mL via URETHRAL

## 2014-05-12 MED ORDER — MIDAZOLAM HCL 5 MG/5ML IJ SOLN
INTRAMUSCULAR | Status: DC | PRN
  Administered 2014-05-12 (×2): 1 mg via INTRAVENOUS

## 2014-05-12 MED ORDER — NEOSTIGMINE METHYLSULFATE 10 MG/10ML IV SOLN
INTRAVENOUS | Status: DC | PRN
Start: 2014-05-12 — End: 2014-05-12
  Administered 2014-05-12: 5 mg via INTRAVENOUS

## 2014-05-12 MED ORDER — PROPOFOL 10 MG/ML IV BOLUS
INTRAVENOUS | Status: DC | PRN
  Administered 2014-05-12: 150 mg via INTRAVENOUS

## 2014-05-12 MED ORDER — FENTANYL CITRATE 0.05 MG/ML IJ SOLN
INTRAMUSCULAR | Status: DC | PRN
  Administered 2014-05-12 (×2): 50 ug via INTRAVENOUS
  Administered 2014-05-12: 100 ug via INTRAVENOUS
  Administered 2014-05-12: 50 ug via INTRAVENOUS

## 2014-05-12 MED ORDER — ALTEPLASE 2 MG IJ SOLR
2.0000 mg | Freq: Once | INTRAMUSCULAR | Status: AC
Start: 2014-05-12 — End: 2014-05-12
  Administered 2014-05-12: 2 mg
  Filled 2014-05-12: qty 2

## 2014-05-12 MED ORDER — WARFARIN SODIUM 10 MG PO TABS
10.0000 mg | ORAL_TABLET | Freq: Once | ORAL | Status: AC
  Administered 2014-05-12: 10 mg via ORAL
  Filled 2014-05-12: qty 1

## 2014-05-12 MED ORDER — HYDROMORPHONE HCL 1 MG/ML IJ SOLN
INTRAMUSCULAR | Status: AC
  Administered 2014-05-12: 0.5 mg via INTRAVENOUS
  Filled 2014-05-12: qty 1

## 2014-05-12 MED ORDER — LACTATED RINGERS IV SOLN
INTRAVENOUS | Status: DC | PRN
  Administered 2014-05-12: 14:00:00 via INTRAVENOUS

## 2014-05-12 MED ORDER — ONDANSETRON HCL 4 MG/2ML IJ SOLN
INTRAMUSCULAR | Status: DC | PRN
  Administered 2014-05-12: 4 mg via INTRAVENOUS

## 2014-05-12 MED ORDER — HYDROMORPHONE HCL 1 MG/ML IJ SOLN
0.2500 mg | INTRAMUSCULAR | Status: DC | PRN
  Administered 2014-05-12 (×4): 0.5 mg via INTRAVENOUS

## 2014-05-12 MED ORDER — GLYCOPYRROLATE 0.2 MG/ML IJ SOLN
INTRAMUSCULAR | Status: DC | PRN
  Administered 2014-05-12: .8 mg via INTRAVENOUS

## 2014-05-12 MED ORDER — LACTATED RINGERS IV SOLN: INTRAVENOUS | Status: DC

## 2014-05-12 MED ORDER — PROMETHAZINE HCL 25 MG/ML IJ SOLN: 6.2500 mg | INTRAMUSCULAR | Status: DC | PRN

## 2014-05-12 MED ORDER — LIDOCAINE HCL (CARDIAC) 20 MG/ML IV SOLN
INTRAVENOUS | Status: DC | PRN
  Administered 2014-05-12: 80 mg via INTRAVENOUS

## 2014-05-12 MED ORDER — MEPERIDINE HCL 25 MG/ML IJ SOLN: 6.2500 mg | INTRAMUSCULAR | Status: DC | PRN

## 2014-05-12 MED ORDER — ROCURONIUM BROMIDE 100 MG/10ML IV SOLN
INTRAVENOUS | Status: DC | PRN
  Administered 2014-05-12: 40 mg via INTRAVENOUS

## 2014-05-12 MED ORDER — MORPHINE SULFATE ER 30 MG PO TBCR
45.0000 mg | EXTENDED_RELEASE_TABLET | Freq: Three times a day (TID) | ORAL | Status: DC
  Administered 2014-05-12 – 2014-05-13 (×4): 45 mg via ORAL
  Filled 2014-05-12 (×10): qty 1

## 2014-05-12 MED ORDER — CEFAZOLIN SODIUM-DEXTROSE 2-3 GM-% IV SOLR
INTRAVENOUS | Status: DC | PRN
  Administered 2014-05-12: 2 g via INTRAVENOUS
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
## 2014-05-12 NOTE — Transfer of Care (Signed)
Immediate Anesthesia Transfer of Care Note  Patient: Carlos Lawrence  Procedure(s) Performed: Procedure(s): RADIOLOGY WITH ANESTHESIA NEPHROURETERAL URETERAL CATH PLACE LEFT (Left)  Patient Location: PACU  Anesthesia Type:General  Level of Consciousness: awake, alert  and oriented  Airway & Oxygen Therapy: Patient Spontanous Breathing  Post-op Assessment: Report given to PACU RN  Post vital signs: Reviewed and stable  Complications: No apparent anesthesia complications

## 2014-05-12 NOTE — Progress Notes (Signed)
JP sent for Creatinine Waiting on possible stent, etc Spoke with interventional this am

## 2014-05-12 NOTE — Anesthesia Postprocedure Evaluation (Signed)
  Anesthesia Post-op Note  Patient: Carlos Lawrence  Procedure(s) Performed: Procedure(s): RADIOLOGY WITH ANESTHESIA NEPHROURETERAL URETERAL CATH PLACE LEFT (Left)  Patient Location: PACU  Anesthesia Type:General  Level of Consciousness: awake  Airway and Oxygen Therapy: Patient Spontanous Breathing  Post-op Pain: mild  Post-op Assessment: Post-op Vital signs reviewed  Post-op Vital Signs: Reviewed  Last Vitals:  Filed Vitals:   05/12/14 1621  BP: 147/88  Pulse: 95  Temp:   Resp: 12    Complications: No apparent anesthesia complications

## 2014-05-12 NOTE — Progress Notes (Signed)
PT Cancellation Note  Patient Details Name: Carlos Lawrence MRN: [id number] DOB: August 04, 1985   Cancelled Treatment:    Reason Eval/Treat Not Completed: Patient at procedure or test/unavailable Patient currently in OR for procedure. Will follow up as time allows. Likely to continue with PT tomorrow.  [place], [place] [name] 05/12/2014, 2:21 PM
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
## 2014-05-12 NOTE — Progress Notes (Signed)
Patient refused BID dressing change. Will report off to am nurse.

## 2014-05-12 NOTE — Anesthesia Postprocedure Evaluation (Signed)
  Anesthesia Post-op Note  Patient: Carlos Lawrence  Procedure(s) Performed: Procedure(s): RADIOLOGY WITH ANESTHESIA NEPHROURETERAL URETERAL CATH PLACE LEFT (Left)  Patient Location: PACU  Anesthesia Type:General  Level of Consciousness: awake and alert   Airway and Oxygen Therapy: Patient Spontanous Breathing and Patient connected to nasal cannula oxygen  Post-op Pain: mild  Post-op Assessment: Post-op Vital signs reviewed and Patient's Cardiovascular Status Stable  Post-op Vital Signs: Reviewed and stable  Last Vitals:  Filed Vitals:   05/12/14 1621  BP: 147/88  Pulse: 95  Temp:   Resp: 12    Complications: No apparent anesthesia complications

## 2014-05-12 NOTE — Anesthesia Procedure Notes (Signed)
Procedure Name: Intubation Date/Time: 05/12/2014 3:00 PM Performed by: Clearnce Sorrel Pre-anesthesia Checklist: Patient identified, Emergency Drugs available, Suction available, Patient being monitored and Timeout performed Patient Re-evaluated:Patient Re-evaluated prior to inductionOxygen Delivery Method: Circle system utilized Preoxygenation: Pre-oxygenation with 100% oxygen Intubation Type: IV induction Ventilation: Mask ventilation without difficulty Laryngoscope Size: Mac and 4 Grade View: Grade II Tube type: Oral Tube size: 7.5 mm Number of attempts: 1 Placement Confirmation: ETT inserted through vocal cords under direct vision,  positive ETCO2 and breath sounds checked- equal and bilateral Secured at: 23 cm Tube secured with: Tape Dental Injury: Teeth and Oropharynx as per pre-operative assessment

## 2014-05-13 DIAGNOSIS — I824Y2 Acute embolism and thrombosis of unspecified deep veins of left proximal lower extremity: Secondary | ICD-10-CM

## 2014-05-13 LAB — PROTIME-INR
INR: 2.18 — ABNORMAL HIGH (ref 0.00–1.49)
PROTHROMBIN TIME: 24.5 s — AB (ref 11.6–15.2)

## 2014-05-13 MED ORDER — APIXABAN 5 MG PO TABS: ORAL_TABLET | ORAL | Status: DC

## 2014-05-13 MED ORDER — MORPHINE SULFATE ER 30 MG PO TBCR
75.0000 mg | EXTENDED_RELEASE_TABLET | Freq: Once | ORAL | Status: AC
  Administered 2014-05-13: 75 mg via ORAL
  Filled 2014-05-13: qty 1
  Filled 2014-05-13: qty 2

## 2014-05-13 MED ORDER — WARFARIN SODIUM 10 MG PO TABS
10.0000 mg | ORAL_TABLET | Freq: Once | ORAL | Status: AC
  Administered 2014-05-13: 10 mg via ORAL
  Filled 2014-05-13: qty 1

## 2014-05-13 MED ORDER — TRAMADOL HCL 50 MG PO TABS: 100.0000 mg | ORAL_TABLET | Freq: Four times a day (QID) | ORAL | Status: DC

## 2014-05-13 MED ORDER — OXYCODONE-ACETAMINOPHEN 10-325 MG PO TABS: 1.0000 | ORAL_TABLET | ORAL | Status: DC | PRN

## 2014-05-13 MED ORDER — POLYETHYLENE GLYCOL 3350 17 G PO PACK: 17.0000 g | PACK | Freq: Every day | ORAL | Status: DC

## 2014-05-13 MED ORDER — METHOCARBAMOL 500 MG PO TABS: 500.0000 mg | ORAL_TABLET | Freq: Four times a day (QID) | ORAL | Status: DC | PRN

## 2014-05-13 MED ORDER — DIAZEPAM 10 MG PO TABS: 10.0000 mg | ORAL_TABLET | Freq: Three times a day (TID) | ORAL | Status: DC

## 2014-05-13 MED ORDER — MORPHINE SULFATE ER 60 MG PO TBCR: 120.0000 mg | EXTENDED_RELEASE_TABLET | Freq: Two times a day (BID) | ORAL | Status: DC

## 2014-05-13 MED ORDER — MORPHINE SULFATE ER 30 MG PO TBCR: 120.0000 mg | EXTENDED_RELEASE_TABLET | Freq: Two times a day (BID) | ORAL | Status: DC
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
## 2014-05-13 NOTE — Progress Notes (Signed)
Very please with excellent job by radiology Please keep patient in until I see him today Still has small bladder leak and partial ureteral LT leak Much improved prognosis Leave all tubes/drains in place and will go home with ALL of them
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
## 2014-05-13 NOTE — Progress Notes (Addendum)
Patient planned for discharge later today (or tomorrow).  HHRN arranged with [facility] according to pt choice w/ insurance barriers.  He will NOT be going to address in [place]IC.  The address where patient will be staying is: [address] [place], [place] [address].  Best phone number to use is [phone].  Pt enrolled in [facility] program for medication assistance.  Provided with letter, list of participating pharmacies and an explanation of the process. Pt/caregiver stated understanding.  Sandi Mariscal, RN BSN MHA CCM  Case Manager, Trauma Service/Unit 20M [phone]   **ADDENDUM:  Pt able to d/c home today.  [facility] will provide the [facility].  Rolling walker, wheelchair and 3-in-1 also provided for patient.   Pt also scheduled for clinic appointment at [facility] and [facility] on [address] for 05/15/2014 at 0900.
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
## 2014-05-14 ENCOUNTER — Encounter (HOSPITAL_COMMUNITY): Payer: Self-pay | Admitting: Radiology

## 2014-05-15 ENCOUNTER — Encounter: Payer: Self-pay | Admitting: Internal Medicine

## 2014-05-15 ENCOUNTER — Telehealth (HOSPITAL_COMMUNITY): Payer: Self-pay

## 2014-05-15 ENCOUNTER — Ambulatory Visit: Payer: Self-pay | Attending: Family Medicine | Admitting: Internal Medicine

## 2014-05-15 VITALS — BP 158/98 | HR 105 | Temp 98.6°F | Resp 15

## 2014-05-15 DIAGNOSIS — W3400XA Accidental discharge from unspecified firearms or gun, initial encounter: Secondary | ICD-10-CM

## 2014-05-15 DIAGNOSIS — R030 Elevated blood-pressure reading, without diagnosis of hypertension: Secondary | ICD-10-CM | POA: Insufficient documentation

## 2014-05-15 DIAGNOSIS — Z936 Other artificial openings of urinary tract status: Secondary | ICD-10-CM | POA: Insufficient documentation

## 2014-05-15 DIAGNOSIS — Z8709 Personal history of other diseases of the respiratory system: Secondary | ICD-10-CM

## 2014-05-15 DIAGNOSIS — J45909 Unspecified asthma, uncomplicated: Secondary | ICD-10-CM | POA: Insufficient documentation

## 2014-05-15 DIAGNOSIS — IMO0001 Reserved for inherently not codable concepts without codable children: Secondary | ICD-10-CM

## 2014-05-15 DIAGNOSIS — S3710XD Unspecified injury of ureter, subsequent encounter: Secondary | ICD-10-CM

## 2014-05-15 DIAGNOSIS — F17210 Nicotine dependence, cigarettes, uncomplicated: Secondary | ICD-10-CM | POA: Insufficient documentation

## 2014-05-15 DIAGNOSIS — Z933 Colostomy status: Secondary | ICD-10-CM | POA: Insufficient documentation

## 2014-05-15 DIAGNOSIS — Z791 Long term (current) use of non-steroidal anti-inflammatories (NSAID): Secondary | ICD-10-CM | POA: Insufficient documentation

## 2014-05-15 DIAGNOSIS — I82409 Acute embolism and thrombosis of unspecified deep veins of unspecified lower extremity: Secondary | ICD-10-CM

## 2014-05-15 DIAGNOSIS — T148 Other injury of unspecified body region: Secondary | ICD-10-CM

## 2014-05-15 DIAGNOSIS — S32810B Multiple fractures of pelvis with stable disruption of pelvic ring, initial encounter for open fracture: Secondary | ICD-10-CM

## 2014-05-15 DIAGNOSIS — S3720XD Unspecified injury of bladder, subsequent encounter: Secondary | ICD-10-CM

## 2014-05-15 DIAGNOSIS — Z7901 Long term (current) use of anticoagulants: Secondary | ICD-10-CM | POA: Insufficient documentation

## 2014-05-15 DIAGNOSIS — Z79891 Long term (current) use of opiate analgesic: Secondary | ICD-10-CM | POA: Insufficient documentation

## 2014-05-15 DIAGNOSIS — Z86718 Personal history of other venous thrombosis and embolism: Secondary | ICD-10-CM | POA: Insufficient documentation

## 2014-05-15 DIAGNOSIS — L02214 Cutaneous abscess of groin: Secondary | ICD-10-CM | POA: Insufficient documentation

## 2014-05-15 DIAGNOSIS — T1490 Injury, unspecified: Secondary | ICD-10-CM | POA: Insufficient documentation

## 2014-05-15 DIAGNOSIS — Y249XXD Unspecified firearm discharge, undetermined intent, subsequent encounter: Secondary | ICD-10-CM | POA: Insufficient documentation

## 2014-05-15 DIAGNOSIS — D62 Acute posthemorrhagic anemia: Secondary | ICD-10-CM | POA: Insufficient documentation

## 2014-05-15 LAB — COMPLETE METABOLIC PANEL WITH GFR
ALBUMIN: 3 g/dL — AB (ref 3.5–5.2)
ALT: 14 U/L (ref 0–53)
AST: 17 U/L (ref 0–37)
Alkaline Phosphatase: 124 U/L — ABNORMAL HIGH (ref 39–117)
BUN: 5 mg/dL — AB (ref 6–23)
CALCIUM: 9.2 mg/dL (ref 8.4–10.5)
CHLORIDE: 99 meq/L (ref 96–112)
CO2: 28 meq/L (ref 19–32)
Creat: 0.68 mg/dL (ref 0.50–1.35)
GFR, Est African American: >89 mL/min
GFR, Est Non African American: >89 mL/min
Glucose, Bld: 84 mg/dL (ref 70–99)
Potassium: 4.3 mEq/L (ref 3.5–5.3)
Sodium: 138 mEq/L (ref 135–145)
TOTAL PROTEIN: 7.6 g/dL (ref 6.0–8.3)
Total Bilirubin: 0.4 mg/dL (ref 0.2–1.2)

## 2014-05-15 LAB — POCT INR: INR: 5.2

## 2014-05-15 NOTE — Telephone Encounter (Signed)
Left message to return call 
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
## 2014-05-15 NOTE — Patient Instructions (Signed)
DASH Eating Plan °DASH stands for "Dietary Approaches to Stop Hypertension." The DASH eating plan is a healthy eating plan that has been shown to reduce high blood pressure (hypertension). Additional health benefits may include reducing the risk of type 2 diabetes mellitus, heart disease, and stroke. The DASH eating plan may also help with weight loss. °WHAT DO I NEED TO KNOW ABOUT THE DASH EATING PLAN? °For the DASH eating plan, you will follow these general guidelines: °· Choose foods with a percent daily value for sodium of less than 5% (as listed on the food label). °· Use salt-free seasonings or herbs instead of table salt or sea salt. °· Check with your health care provider or pharmacist before using salt substitutes. °· Eat lower-sodium products, often labeled as "lower sodium" or "no salt added." °· Eat fresh foods. °· Eat more vegetables, fruits, and low-fat dairy products. °· Choose whole grains. Look for the word "whole" as the first word in the ingredient list. °· Choose fish and skinless chicken or turkey more often than red meat. Limit fish, poultry, and meat to 6 oz (170 g) each day. °· Limit sweets, desserts, sugars, and sugary drinks. °· Choose heart-healthy fats. °· Limit cheese to 1 oz (28 g) per day. °· Eat more home-cooked food and less restaurant, buffet, and fast food. °· Limit fried foods. °· Cook foods using methods other than frying. °· Limit canned vegetables. If you do use them, rinse them well to decrease the sodium. °· When eating at a restaurant, ask that your food be prepared with less salt, or no salt if possible. °WHAT FOODS CAN I EAT? °Seek help from a dietitian for individual calorie needs. °Grains °Whole grain or whole wheat bread. Brown rice. Whole grain or whole wheat pasta. Quinoa, bulgur, and whole grain cereals. Low-sodium cereals. Corn or whole wheat flour tortillas. Whole grain cornbread. Whole grain crackers. Low-sodium crackers. °Vegetables °Fresh or frozen vegetables  (raw, steamed, roasted, or grilled). Low-sodium or reduced-sodium tomato and vegetable juices. Low-sodium or reduced-sodium tomato sauce and paste. Low-sodium or reduced-sodium canned vegetables.  °Fruits °All fresh, canned (in natural juice), or frozen fruits. °Meat and Other Protein Products °Ground beef (85% or leaner), grass-fed beef, or beef trimmed of fat. Skinless chicken or turkey. Ground chicken or turkey. Pork trimmed of fat. All fish and seafood. Eggs. Dried beans, peas, or lentils. Unsalted nuts and seeds. Unsalted canned beans. °Dairy °Low-fat dairy products, such as skim or 1% milk, 2% or reduced-fat cheeses, low-fat ricotta or cottage cheese, or plain low-fat yogurt. Low-sodium or reduced-sodium cheeses. °Fats and Oils °Tub margarines without trans fats. Light or reduced-fat mayonnaise and salad dressings (reduced sodium). Avocado. Safflower, olive, or canola oils. Natural peanut or almond butter. °Other °Unsalted popcorn and pretzels. °The items listed above may not be a complete list of recommended foods or beverages. Contact your dietitian for more options. °WHAT FOODS ARE NOT RECOMMENDED? °Grains °White bread. White pasta. White rice. Refined cornbread. Bagels and croissants. Crackers that contain trans fat. °Vegetables °Creamed or fried vegetables. Vegetables in a cheese sauce. Regular canned vegetables. Regular canned tomato sauce and paste. Regular tomato and vegetable juices. °Fruits °Dried fruits. Canned fruit in light or heavy syrup. Fruit juice. °Meat and Other Protein Products °Fatty cuts of meat. Ribs, chicken wings, bacon, sausage, bologna, salami, chitterlings, fatback, hot dogs, bratwurst, and packaged luncheon meats. Salted nuts and seeds. Canned beans with salt. °Dairy °Whole or 2% milk, cream, half-and-half, and cream cheese. Whole-fat or sweetened yogurt. Full-fat   cheeses or blue cheese. Nondairy creamers and whipped toppings. Processed cheese, cheese spreads, or cheese  curds. °Condiments °Onion and garlic salt, seasoned salt, table salt, and sea salt. Canned and packaged gravies. Worcestershire sauce. Tartar sauce. Barbecue sauce. Teriyaki sauce. Soy sauce, including reduced sodium. Steak sauce. Fish sauce. Oyster sauce. Cocktail sauce. Horseradish. Ketchup and mustard. Meat flavorings and tenderizers. Bouillon cubes. Hot sauce. Tabasco sauce. Marinades. Taco seasonings. Relishes. °Fats and Oils °Butter, stick margarine, lard, shortening, ghee, and bacon fat. Coconut, palm kernel, or palm oils. Regular salad dressings. °Other °Pickles and olives. Salted popcorn and pretzels. °The items listed above may not be a complete list of foods and beverages to avoid. Contact your dietitian for more information. °WHERE CAN I FIND MORE INFORMATION? °National Heart, Lung, and [employer] Institute: www.nhlbi.nih.gov/health/health-topics/topics/dash/ °Document Released: 03/31/2011 Document Revised: 08/26/2013 Document Reviewed: 02/13/2013 °Exit[employer]® Patient Information ©2015 [employer]. This information is not intended to replace advice given to you by your health care provider. Make sure you discuss any questions you have with your health care provider. ° °
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
## 2014-05-16 LAB — CBC WITH DIFFERENTIAL/PLATELET
Basophils Absolute: 0 10*3/uL (ref 0.0–0.1)
Basophils Relative: 0 % (ref 0–1)
EOS PCT: 2 % (ref 0–5)
Eosinophils Absolute: 0.3 10*3/uL (ref 0.0–0.7)
HCT: 28 % — ABNORMAL LOW (ref 39.0–52.0)
HEMOGLOBIN: 9.2 g/dL — AB (ref 13.0–17.0)
LYMPHS PCT: 8 % — AB (ref 12–46)
Lymphs Abs: 1.1 10*3/uL (ref 0.7–4.0)
MCH: 26.4 pg (ref 26.0–34.0)
MCHC: 32.9 g/dL (ref 30.0–36.0)
MCV: 80.2 fL (ref 78.0–100.0)
MPV: 8.4 fL — ABNORMAL LOW (ref 8.6–12.4)
Monocytes Absolute: 0.7 10*3/uL (ref 0.1–1.0)
Monocytes Relative: 5 % (ref 3–12)
NEUTROS PCT: 85 % — AB (ref 43–77)
Neutro Abs: 11.5 10*3/uL — ABNORMAL HIGH (ref 1.7–7.7)
Platelets: 652 10*3/uL — ABNORMAL HIGH (ref 150–400)
RBC: 3.49 MIL/uL — ABNORMAL LOW (ref 4.22–5.81)
RDW: 17.2 % — ABNORMAL HIGH (ref 11.5–15.5)
WBC: 13.5 10*3/uL — ABNORMAL HIGH (ref 4.0–10.5)

## 2014-05-16 LAB — VITAMIN D 25 HYDROXY (VIT D DEFICIENCY, FRACTURES): VIT D 25 HYDROXY: 9 ng/mL — AB (ref 30–100)

## 2014-05-17 ENCOUNTER — Encounter (HOSPITAL_COMMUNITY): Payer: Self-pay | Admitting: Adult Health

## 2014-05-19 ENCOUNTER — Telehealth (HOSPITAL_COMMUNITY): Payer: Self-pay

## 2014-05-19 NOTE — Telephone Encounter (Signed)
Noted message from [facility].

## 2014-05-20 ENCOUNTER — Telehealth: Payer: Self-pay | Admitting: *Deleted

## 2014-05-20 MED ORDER — VITAMIN D (ERGOCALCIFEROL) 1.25 MG (50000 UNIT) PO CAPS: 50000.0000 [IU] | ORAL_CAPSULE | ORAL | Status: DC

## 2014-05-20 NOTE — Telephone Encounter (Signed)
-----   Message from Lorayne Marek, MD sent at 05/19/2014 10:29 AM EST ----- Blood work reviewed, noticed low vitamin D, call patient advise to start ergocalciferol 50,000 units once a week for the duration of  12 weeks. Also let the patient know that his hemoglobin level is stable, noticed  leukocytosis, will repeat CBC on the following visit.

## 2014-05-20 NOTE — Telephone Encounter (Signed)
Rx was send to [facility] pharmacy. Left voice message to return call

## 2014-05-24 ENCOUNTER — Emergency Department (HOSPITAL_COMMUNITY): Payer: Self-pay

## 2014-05-24 ENCOUNTER — Encounter (HOSPITAL_COMMUNITY): Payer: Self-pay | Admitting: Radiology

## 2014-05-24 ENCOUNTER — Inpatient Hospital Stay (HOSPITAL_COMMUNITY)
Admission: EM | Admit: 2014-05-24 | Discharge: 2014-05-27 | DRG: 872 | Disposition: A | Discharge status: Home Health | Payer: Self-pay | Attending: Family Medicine | Admitting: Family Medicine

## 2014-05-24 DIAGNOSIS — N12 Tubulo-interstitial nephritis, not specified as acute or chronic: Secondary | ICD-10-CM | POA: Diagnosis present

## 2014-05-24 DIAGNOSIS — R109 Unspecified abdominal pain: Secondary | ICD-10-CM

## 2014-05-24 DIAGNOSIS — Z86718 Personal history of other venous thrombosis and embolism: Secondary | ICD-10-CM

## 2014-05-24 DIAGNOSIS — D473 Essential (hemorrhagic) thrombocythemia: Secondary | ICD-10-CM | POA: Diagnosis present

## 2014-05-24 DIAGNOSIS — Z7951 Long term (current) use of inhaled steroids: Secondary | ICD-10-CM

## 2014-05-24 DIAGNOSIS — A419 Sepsis, unspecified organism: Principal | ICD-10-CM | POA: Diagnosis present

## 2014-05-24 DIAGNOSIS — S32599A Other specified fracture of unspecified pubis, initial encounter for closed fracture: Secondary | ICD-10-CM | POA: Insufficient documentation

## 2014-05-24 DIAGNOSIS — F17210 Nicotine dependence, cigarettes, uncomplicated: Secondary | ICD-10-CM | POA: Diagnosis present

## 2014-05-24 DIAGNOSIS — Z7901 Long term (current) use of anticoagulants: Secondary | ICD-10-CM

## 2014-05-24 DIAGNOSIS — I10 Essential (primary) hypertension: Secondary | ICD-10-CM | POA: Insufficient documentation

## 2014-05-24 DIAGNOSIS — E876 Hypokalemia: Secondary | ICD-10-CM | POA: Diagnosis present

## 2014-05-24 DIAGNOSIS — E871 Hypo-osmolality and hyponatremia: Secondary | ICD-10-CM | POA: Diagnosis present

## 2014-05-24 DIAGNOSIS — D62 Acute posthemorrhagic anemia: Secondary | ICD-10-CM | POA: Diagnosis present

## 2014-05-24 DIAGNOSIS — R1084 Generalized abdominal pain: Secondary | ICD-10-CM | POA: Insufficient documentation

## 2014-05-24 DIAGNOSIS — Z79899 Other long term (current) drug therapy: Secondary | ICD-10-CM

## 2014-05-24 DIAGNOSIS — Z79891 Long term (current) use of opiate analgesic: Secondary | ICD-10-CM

## 2014-05-24 DIAGNOSIS — R1032 Left lower quadrant pain: Secondary | ICD-10-CM

## 2014-05-24 DIAGNOSIS — R1031 Right lower quadrant pain: Secondary | ICD-10-CM

## 2014-05-24 DIAGNOSIS — Z933 Colostomy status: Secondary | ICD-10-CM

## 2014-05-24 DIAGNOSIS — G8929 Other chronic pain: Secondary | ICD-10-CM

## 2014-05-24 DIAGNOSIS — E46 Unspecified protein-calorie malnutrition: Secondary | ICD-10-CM

## 2014-05-24 HISTORY — DX: Essential (primary) hypertension: I10

## 2014-05-24 LAB — COMPREHENSIVE METABOLIC PANEL
ALK PHOS: 103 U/L (ref 39–117)
ALT: 12 U/L (ref 0–53)
ANION GAP: 11 (ref 5–15)
AST: 27 U/L (ref 0–37)
Albumin: 2.3 g/dL — ABNORMAL LOW (ref 3.5–5.2)
BUN: 8 mg/dL (ref 6–23)
CALCIUM: 9.1 mg/dL (ref 8.4–10.5)
CO2: 25 mmol/L (ref 19–32)
CREATININE: 0.92 mg/dL (ref 0.50–1.35)
Chloride: 97 mmol/L (ref 96–112)
GFR calc Af Amer: >90 mL/min (ref >90)
GFR calc non Af Amer: >90 mL/min (ref >90)
Glucose, Bld: 101 mg/dL — ABNORMAL HIGH (ref 70–99)
Potassium: 4 mmol/L (ref 3.5–5.1)
Sodium: 133 mmol/L — ABNORMAL LOW (ref 135–145)
Total Bilirubin: 0.3 mg/dL (ref 0.3–1.2)
Total Protein: 8.6 g/dL — ABNORMAL HIGH (ref 6.0–8.3)

## 2014-05-24 LAB — URINALYSIS, ROUTINE W REFLEX MICROSCOPIC
Bilirubin Urine: NEGATIVE
Glucose, UA: NEGATIVE mg/dL
Ketones, ur: NEGATIVE mg/dL
Nitrite: POSITIVE — AB
Protein, ur: 100 mg/dL — AB
Specific Gravity, Urine: 1.023 (ref 1.005–1.030)
Urobilinogen, UA: 1 mg/dL (ref 0.0–1.0)
pH: 6 (ref 5.0–8.0)

## 2014-05-24 LAB — CBC WITH DIFFERENTIAL/PLATELET
BASOS ABS: 0 10*3/uL (ref 0.0–0.1)
BASOS PCT: 0 % (ref 0–1)
EOS ABS: 0.4 10*3/uL (ref 0.0–0.7)
Eosinophils Relative: 2 % (ref 0–5)
HCT: 29.8 % — ABNORMAL LOW (ref 39.0–52.0)
HEMOGLOBIN: 9.9 g/dL — AB (ref 13.0–17.0)
LYMPHS ABS: 2.3 10*3/uL (ref 0.7–4.0)
LYMPHS PCT: 15 % (ref 12–46)
MCH: 25.2 pg — ABNORMAL LOW (ref 26.0–34.0)
MCHC: 33.2 g/dL (ref 30.0–36.0)
MCV: 75.8 fL — ABNORMAL LOW (ref 78.0–100.0)
MONO ABS: 1.1 10*3/uL — AB (ref 0.1–1.0)
MONOS PCT: 7 % (ref 3–12)
Neutro Abs: 12.2 10*3/uL — ABNORMAL HIGH (ref 1.7–7.7)
Neutrophils Relative %: 76 % (ref 43–77)
PLATELETS: 756 10*3/uL — AB (ref 150–400)
RBC: 3.93 MIL/uL — ABNORMAL LOW (ref 4.22–5.81)
RDW: 19 % — ABNORMAL HIGH (ref 11.5–15.5)
WBC: 16 10*3/uL — AB (ref 4.0–10.5)

## 2014-05-24 LAB — PROTIME-INR
INR: 1.21 (ref 0.00–1.49)
Prothrombin Time: 15.4 seconds — ABNORMAL HIGH (ref 11.6–15.2)

## 2014-05-24 LAB — URINE MICROSCOPIC-ADD ON

## 2014-05-24 LAB — I-STAT CG4 LACTIC ACID, ED
LACTIC ACID, VENOUS: 3.79 mmol/L — AB (ref 0.5–2.0)
LACTIC ACID, VENOUS: 0.54 mmol/L (ref 0.5–2.0)

## 2014-05-24 LAB — LACTIC ACID, PLASMA: Lactic Acid, Venous: 1 mmol/L (ref 0.5–2.0)

## 2014-05-24 MED ORDER — SODIUM CHLORIDE 0.9 % IV BOLUS (SEPSIS)
1000.0000 mL | Freq: Once | INTRAVENOUS | Status: AC
  Administered 2014-05-24: 1000 mL via INTRAVENOUS

## 2014-05-24 MED ORDER — DIAZEPAM 5 MG PO TABS
10.0000 mg | ORAL_TABLET | Freq: Once | ORAL | Status: AC
  Administered 2014-05-24: 10 mg via ORAL
  Filled 2014-05-24: qty 2

## 2014-05-24 MED ORDER — ONDANSETRON HCL 4 MG/2ML IJ SOLN: 4.0000 mg | Freq: Four times a day (QID) | INTRAMUSCULAR | Status: DC | PRN

## 2014-05-24 MED ORDER — HYDROMORPHONE HCL 1 MG/ML IJ SOLN
1.0000 mg | Freq: Once | INTRAMUSCULAR | Status: AC
  Administered 2014-05-24: 1 mg via INTRAVENOUS
  Filled 2014-05-24: qty 1

## 2014-05-24 MED ORDER — SODIUM CHLORIDE 0.45 % IV SOLN
INTRAVENOUS | Status: DC
  Administered 2014-05-24 – 2014-05-27 (×5): via INTRAVENOUS

## 2014-05-24 MED ORDER — OXYCODONE-ACETAMINOPHEN 10-325 MG PO TABS: 1.0000 | ORAL_TABLET | ORAL | Status: DC | PRN

## 2014-05-24 MED ORDER — MORPHINE SULFATE ER 30 MG PO TBCR
60.0000 mg | EXTENDED_RELEASE_TABLET | Freq: Two times a day (BID) | ORAL | Status: DC
  Administered 2014-05-24: 60 mg via ORAL
  Filled 2014-05-24: qty 2

## 2014-05-24 MED ORDER — ONDANSETRON HCL 4 MG PO TABS
4.0000 mg | ORAL_TABLET | Freq: Four times a day (QID) | ORAL | Status: DC | PRN
Start: 2014-05-24 — End: 2014-05-27

## 2014-05-24 MED ORDER — HYDROMORPHONE HCL 1 MG/ML IJ SOLN
3.0000 mg | INTRAMUSCULAR | Status: DC | PRN
  Administered 2014-05-24 – 2014-05-27 (×27): 3 mg via INTRAVENOUS
  Filled 2014-05-24 (×28): qty 3

## 2014-05-24 MED ORDER — SODIUM CHLORIDE 0.9 % IV SOLN
INTRAVENOUS | Status: DC
  Administered 2014-05-24: 16:00:00 via INTRAVENOUS

## 2014-05-24 MED ORDER — ALBUTEROL SULFATE (2.5 MG/3ML) 0.083% IN NEBU: 2.5000 mg | INHALATION_SOLUTION | Freq: Four times a day (QID) | RESPIRATORY_TRACT | Status: DC | PRN

## 2014-05-24 MED ORDER — IOHEXOL 300 MG/ML  SOLN
100.0000 mL | Freq: Once | INTRAMUSCULAR | Status: AC | PRN
  Administered 2014-05-24: 100 mL via INTRAVENOUS

## 2014-05-24 MED ORDER — IOHEXOL 300 MG/ML  SOLN: 25.0000 mL | Freq: Once | INTRAMUSCULAR | Status: DC | PRN

## 2014-05-24 MED ORDER — POLYETHYLENE GLYCOL 3350 17 G PO PACK
17.0000 g | PACK | Freq: Every day | ORAL | Status: DC
  Filled 2014-05-24 (×3): qty 1

## 2014-05-24 MED ORDER — DIAZEPAM 5 MG PO TABS
10.0000 mg | ORAL_TABLET | Freq: Three times a day (TID) | ORAL | Status: DC
  Administered 2014-05-24 – 2014-05-27 (×9): 10 mg via ORAL
  Filled 2014-05-24 (×9): qty 2

## 2014-05-24 MED ORDER — APIXABAN 5 MG PO TABS
5.0000 mg | ORAL_TABLET | Freq: Two times a day (BID) | ORAL | Status: DC
  Administered 2014-05-24 – 2014-05-27 (×6): 5 mg via ORAL
  Filled 2014-05-24 (×9): qty 1

## 2014-05-24 MED ORDER — SODIUM CHLORIDE 0.9 % IV BOLUS (SEPSIS)
500.0000 mL | Freq: Once | INTRAVENOUS | Status: AC
  Administered 2014-05-24: 500 mL via INTRAVENOUS

## 2014-05-24 MED ORDER — ACETAMINOPHEN 500 MG PO TABS
1000.0000 mg | ORAL_TABLET | Freq: Once | ORAL | Status: AC
  Administered 2014-05-24: 1000 mg via ORAL
  Filled 2014-05-24: qty 2

## 2014-05-24 MED ORDER — ONDANSETRON HCL 4 MG/2ML IJ SOLN
4.0000 mg | Freq: Once | INTRAMUSCULAR | Status: AC
  Administered 2014-05-24: 4 mg via INTRAVENOUS
  Filled 2014-05-24: qty 2

## 2014-05-24 MED ORDER — VANCOMYCIN HCL IN DEXTROSE 1-5 GM/200ML-% IV SOLN
1000.0000 mg | Freq: Once | INTRAVENOUS | Status: AC
  Administered 2014-05-24: 1000 mg via INTRAVENOUS
  Filled 2014-05-24: qty 200

## 2014-05-24 MED ORDER — SODIUM CHLORIDE 0.9 % IV BOLUS (SEPSIS): 1000.0000 mL | Freq: Once | INTRAVENOUS | Status: DC

## 2014-05-24 MED ORDER — OXYCODONE-ACETAMINOPHEN 5-325 MG PO TABS
1.0000 | ORAL_TABLET | ORAL | Status: DC | PRN
  Administered 2014-05-24 (×2): 2 via ORAL
  Filled 2014-05-24 (×2): qty 2

## 2014-05-24 MED ORDER — OXYCODONE HCL 5 MG PO TABS
5.0000 mg | ORAL_TABLET | ORAL | Status: DC | PRN
  Administered 2014-05-24: 10 mg via ORAL
  Filled 2014-05-24: qty 2

## 2014-05-24 MED ORDER — PIPERACILLIN-TAZOBACTAM 3.375 G IVPB 30 MIN
3.3750 g | Freq: Once | INTRAVENOUS | Status: AC
  Administered 2014-05-24: 3.375 g via INTRAVENOUS
  Filled 2014-05-24: qty 50

## 2014-05-24 MED ORDER — OXYCODONE HCL 5 MG PO TABS
10.0000 mg | ORAL_TABLET | Freq: Once | ORAL | Status: AC
  Administered 2014-05-24: 10 mg via ORAL
  Filled 2014-05-24: qty 2

## 2014-05-24 MED ORDER — METHOCARBAMOL 500 MG PO TABS
500.0000 mg | ORAL_TABLET | Freq: Four times a day (QID) | ORAL | Status: DC | PRN
  Administered 2014-05-24 – 2014-05-27 (×5): 1000 mg via ORAL
  Filled 2014-05-24: qty 1
  Filled 2014-05-24: qty 2
  Filled 2014-05-24: qty 1
  Filled 2014-05-24 (×3): qty 2

## 2014-05-24 MED ORDER — SODIUM CHLORIDE 0.9 % IJ SOLN
3.0000 mL | Freq: Two times a day (BID) | INTRAMUSCULAR | Status: DC
  Administered 2014-05-27: 3 mL via INTRAVENOUS

## 2014-05-24 NOTE — ED Notes (Signed)
Abnormal lactic acid given to PA Margarita Mail
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
## 2014-05-24 NOTE — ED Notes (Signed)
Activated Level 2 Sepsis
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
## 2014-05-24 NOTE — ED Notes (Signed)
IV team at bedside 
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
## 2014-05-24 NOTE — ED Notes (Signed)
To ct scan

## 2014-05-25 DIAGNOSIS — S32501D Unspecified fracture of right pubis, subsequent encounter for fracture with routine healing: Secondary | ICD-10-CM

## 2014-05-25 DIAGNOSIS — S32599A Other specified fracture of unspecified pubis, initial encounter for closed fracture: Secondary | ICD-10-CM | POA: Insufficient documentation

## 2014-05-25 DIAGNOSIS — A419 Sepsis, unspecified organism: Principal | ICD-10-CM

## 2014-05-25 DIAGNOSIS — N12 Tubulo-interstitial nephritis, not specified as acute or chronic: Secondary | ICD-10-CM

## 2014-05-25 DIAGNOSIS — I10 Essential (primary) hypertension: Secondary | ICD-10-CM

## 2014-05-25 LAB — BASIC METABOLIC PANEL
Anion gap: 10 (ref 5–15)
BUN: <5 mg/dL — ABNORMAL LOW (ref 6–23)
CALCIUM: 8.2 mg/dL — AB (ref 8.4–10.5)
CO2: 25 mmol/L (ref 19–32)
CREATININE: 0.54 mg/dL (ref 0.50–1.35)
Chloride: 98 mmol/L (ref 96–112)
GFR calc Af Amer: >90 mL/min (ref >90)
GFR calc non Af Amer: >90 mL/min (ref >90)
Glucose, Bld: 91 mg/dL (ref 70–99)
POTASSIUM: 3.4 mmol/L — AB (ref 3.5–5.1)
Sodium: 133 mmol/L — ABNORMAL LOW (ref 135–145)

## 2014-05-25 LAB — CBC
HCT: 25.9 % — ABNORMAL LOW (ref 39.0–52.0)
Hemoglobin: 8.6 g/dL — ABNORMAL LOW (ref 13.0–17.0)
MCH: 24.8 pg — AB (ref 26.0–34.0)
MCHC: 33.2 g/dL (ref 30.0–36.0)
MCV: 74.6 fL — ABNORMAL LOW (ref 78.0–100.0)
Platelets: 603 10*3/uL — ABNORMAL HIGH (ref 150–400)
RBC: 3.47 MIL/uL — ABNORMAL LOW (ref 4.22–5.81)
RDW: 19 % — ABNORMAL HIGH (ref 11.5–15.5)
WBC: 8 10*3/uL (ref 4.0–10.5)

## 2014-05-25 MED ORDER — AMLODIPINE BESYLATE 5 MG PO TABS
5.0000 mg | ORAL_TABLET | Freq: Every day | ORAL | Status: DC
  Administered 2014-05-25 – 2014-05-26 (×2): 5 mg via ORAL
  Filled 2014-05-25 (×5): qty 1

## 2014-05-25 MED ORDER — VANCOMYCIN HCL IN DEXTROSE 1-5 GM/200ML-% IV SOLN
1000.0000 mg | Freq: Three times a day (TID) | INTRAVENOUS | Status: DC
  Administered 2014-05-25 – 2014-05-26 (×4): 1000 mg via INTRAVENOUS
  Filled 2014-05-25 (×7): qty 200

## 2014-05-25 MED ORDER — PIPERACILLIN-TAZOBACTAM 3.375 G IVPB
3.3750 g | Freq: Three times a day (TID) | INTRAVENOUS | Status: DC
  Administered 2014-05-25 – 2014-05-26 (×4): 3.375 g via INTRAVENOUS
  Filled 2014-05-25 (×7): qty 50

## 2014-05-25 MED ORDER — MORPHINE SULFATE ER 30 MG PO TBCR
120.0000 mg | EXTENDED_RELEASE_TABLET | Freq: Two times a day (BID) | ORAL | Status: DC
  Administered 2014-05-25 – 2014-05-27 (×5): 120 mg via ORAL
  Filled 2014-05-25 (×5): qty 4

## 2014-05-25 NOTE — Plan of Care (Signed)
Problem: Phase I Progression Outcomes Goal: Pain controlled with appropriate interventions Outcome: Not Progressing Pain meds adjusted today. Still crying with pain.

## 2014-05-25 NOTE — Progress Notes (Signed)
PT Cancellation Note  Patient Details Name: Carlos Lawrence MRN: [id number] DOB: 1986/03/05   Cancelled Treatment:    Reason Eval/Treat Not Completed: Pain limiting ability to participate.  Patient declined PT this pm.  Will return tomorrow for PT evaluation.   Despina Pole 05/25/2014, 7:46 PM Carita Pian. Sanjuana Kava, [place] Pager [phone]

## 2014-05-25 NOTE — Progress Notes (Signed)
Orthopedic Tech Progress Note Patient Details:  Carlos Lawrence 1985/08/21 [id number] OHF applied to bed Patient ID: Carlos Lawrence, male   DOB: Feb 01, 1986, 29 y.o.   MRN: [id number]   Carlos Lawrence 05/25/2014, 1:11 PM
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
## 2014-05-26 DIAGNOSIS — R1084 Generalized abdominal pain: Secondary | ICD-10-CM | POA: Insufficient documentation

## 2014-05-26 DIAGNOSIS — S32509D Unspecified fracture of unspecified pubis, subsequent encounter for fracture with routine healing: Secondary | ICD-10-CM

## 2014-05-26 LAB — CBC
HCT: 25.7 % — ABNORMAL LOW (ref 39.0–52.0)
Hemoglobin: 8.4 g/dL — ABNORMAL LOW (ref 13.0–17.0)
MCH: 24.9 pg — ABNORMAL LOW (ref 26.0–34.0)
MCHC: 32.7 g/dL (ref 30.0–36.0)
MCV: 76.3 fL — ABNORMAL LOW (ref 78.0–100.0)
Platelets: 530 10*3/uL — ABNORMAL HIGH (ref 150–400)
RBC: 3.37 MIL/uL — ABNORMAL LOW (ref 4.22–5.81)
RDW: 18.4 % — ABNORMAL HIGH (ref 11.5–15.5)
WBC: 7 10*3/uL (ref 4.0–10.5)

## 2014-05-26 LAB — URINE CULTURE: Colony Count: >=100000

## 2014-05-26 LAB — BASIC METABOLIC PANEL
Anion gap: 8 (ref 5–15)
CALCIUM: 8.6 mg/dL (ref 8.4–10.5)
CO2: 27 mmol/L (ref 19–32)
Chloride: 99 mmol/L (ref 96–112)
Creatinine, Ser: 0.72 mg/dL (ref 0.50–1.35)
GFR calc Af Amer: >90 mL/min (ref >90)
GFR calc non Af Amer: >90 mL/min (ref >90)
Glucose, Bld: 123 mg/dL — ABNORMAL HIGH (ref 70–99)
Potassium: 3.2 mmol/L — ABNORMAL LOW (ref 3.5–5.1)
SODIUM: 134 mmol/L — AB (ref 135–145)

## 2014-05-26 LAB — MRSA PCR SCREENING: MRSA BY PCR: POSITIVE — AB

## 2014-05-26 MED ORDER — LEVOFLOXACIN IN D5W 750 MG/150ML IV SOLN
750.0000 mg | INTRAVENOUS | Status: DC
  Administered 2014-05-26: 750 mg via INTRAVENOUS
  Filled 2014-05-26 (×2): qty 150

## 2014-05-26 MED ORDER — PRO-STAT SUGAR FREE PO LIQD
30.0000 mL | Freq: Three times a day (TID) | ORAL | Status: DC
  Administered 2014-05-26: 30 mL via ORAL
  Filled 2014-05-26 (×5): qty 30

## 2014-05-26 MED ORDER — POTASSIUM CHLORIDE CRYS ER 20 MEQ PO TBCR
40.0000 meq | EXTENDED_RELEASE_TABLET | Freq: Once | ORAL | Status: AC
  Administered 2014-05-26: 40 meq via ORAL
  Filled 2014-05-26: qty 2

## 2014-05-26 NOTE — Progress Notes (Signed)
PT Cancellation Note  Patient Details Name: Carlos Lawrence MRN: [id number] DOB: [birth year]   Cancelled Treatment:    Reason Eval/Treat Not Completed: Medical issues which prohibited therapy--  Unclear weight-bearing status.   **NOTE on previous admission, orders were for TDWB Left leg, WBAT Right leg, and NWB Rt arm.    Current weight-bearing orders do not match these. MD, please clarify weight-bearing restrictions, including if patient is allowed to weight-bear through his Rt elbow.  Thank you,  Jameca Chumley 05/26/2014, 8:32 AM
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
## 2014-05-26 NOTE — Progress Notes (Signed)
OT Cancellation Note  Patient Details Name: Carlos Lawrence MRN: [id number] DOB: 20-Jan-1986   Cancelled Treatment:    Reason Eval/Treat Not Completed: Other (comment). PT currently working with pt on W/C issues (per pt). Will re-attempt eval tomorrow.  Almon Register [phone] 05/26/2014, 4:34 PM
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
## 2014-05-26 NOTE — Consult Note (Addendum)
WOC ostomy consult note Stoma type/location: Pt familiar to [facility] team from previous admission. Refer to progress notes on 1/13. Stomal assessment/size: Colostomy stoma to LLQ Peristomal assessment: Current pouch intact with good seal, was applied 1/30 pt states.  Output Mod amt semi-formed brown stool in the pouch. Ostomy pouching: 1pc. Education provided: Pt states he is independent with pouch application and emptying when at home. He denies any difficulty with pouch changes, ordering supplies, or peristomal skin. He uses barrier rings to maintain seal and one piece pouches. He denies any further questions and is well-informed. Bedside nurses can assist with pouch emptying while in the hospital and he can change pouch when necessary. Supplies ordered to bedside. Please re-consult if further assistance is needed. Thank-you,  Julien Girt MSN, [place], [place], [place], [place]
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
## 2014-05-26 NOTE — Clinical Documentation Improvement (Signed)
"  Protein Calorie Malnutrition" documented in H&P and progress notes. Please review Registered Dietician Assessment 05/26/14 at 10:21 am.  Please clarify if the diagnosis of "Protein Calorie Malnutrition":   - IS applicable to this admission, including Clinical Indicators.  (if applicable, please include severity - mild, moderate, severe)   - IS NOT applicable to this admission.   Thank You, Erling Conte ,RN Clinical Documentation Specialist:  [phone] [facility] [employer]- Health Information Management
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
## 2014-05-27 DIAGNOSIS — E46 Unspecified protein-calorie malnutrition: Secondary | ICD-10-CM

## 2014-05-27 DIAGNOSIS — D62 Acute posthemorrhagic anemia: Secondary | ICD-10-CM

## 2014-05-27 LAB — CBC
HCT: 25.2 % — ABNORMAL LOW (ref 39.0–52.0)
Hemoglobin: 8.2 g/dL — ABNORMAL LOW (ref 13.0–17.0)
MCH: 24.2 pg — AB (ref 26.0–34.0)
MCHC: 32.5 g/dL (ref 30.0–36.0)
MCV: 74.3 fL — AB (ref 78.0–100.0)
Platelets: 540 10*3/uL — ABNORMAL HIGH (ref 150–400)
RBC: 3.39 MIL/uL — AB (ref 4.22–5.81)
RDW: 18.6 % — AB (ref 11.5–15.5)
WBC: 7.3 10*3/uL (ref 4.0–10.5)

## 2014-05-27 LAB — BASIC METABOLIC PANEL
ANION GAP: 8 (ref 5–15)
BUN: <5 mg/dL — ABNORMAL LOW (ref 6–23)
CO2: 27 mmol/L (ref 19–32)
CREATININE: 0.75 mg/dL (ref 0.50–1.35)
Calcium: 8.7 mg/dL (ref 8.4–10.5)
Chloride: 103 mmol/L (ref 96–112)
GFR calc non Af Amer: >90 mL/min (ref >90)
Glucose, Bld: 97 mg/dL (ref 70–99)
POTASSIUM: 3.5 mmol/L (ref 3.5–5.1)
Sodium: 138 mmol/L (ref 135–145)

## 2014-05-27 LAB — MAGNESIUM: MAGNESIUM: 1.6 mg/dL (ref 1.5–2.5)

## 2014-05-27 MED ORDER — MORPHINE SULFATE ER 60 MG PO TBCR: 120.0000 mg | EXTENDED_RELEASE_TABLET | Freq: Two times a day (BID) | ORAL | Status: DC

## 2014-05-27 MED ORDER — MORPHINE SULFATE 30 MG PO TABS: 30.0000 mg | ORAL_TABLET | ORAL | Status: DC | PRN

## 2014-05-27 MED ORDER — DIAZEPAM 10 MG PO TABS: 10.0000 mg | ORAL_TABLET | Freq: Three times a day (TID) | ORAL | Status: DC

## 2014-05-27 MED ORDER — DOCUSATE SODIUM 50 MG PO CAPS: 50.0000 mg | ORAL_CAPSULE | Freq: Two times a day (BID) | ORAL | Status: DC

## 2014-05-27 MED ORDER — CIPROFLOXACIN HCL 500 MG PO TABS: 500.0000 mg | ORAL_TABLET | Freq: Two times a day (BID) | ORAL | Status: DC

## 2014-05-27 MED ORDER — AMLODIPINE BESYLATE 10 MG PO TABS
10.0000 mg | ORAL_TABLET | Freq: Every day | ORAL | Status: DC
  Administered 2014-05-27: 10 mg via ORAL
  Filled 2014-05-27: qty 1

## 2014-05-27 MED ORDER — SENNA 8.6 MG PO TABS
2.0000 | ORAL_TABLET | Freq: Every day | ORAL | Status: DC
  Filled 2014-05-27: qty 2

## 2014-05-27 MED ORDER — MORPHINE SULFATE 15 MG PO TABS
30.0000 mg | ORAL_TABLET | ORAL | Status: DC | PRN
  Administered 2014-05-27 (×2): 30 mg via ORAL
  Filled 2014-05-27 (×2): qty 2

## 2014-05-27 MED ORDER — HYDROMORPHONE HCL 1 MG/ML IJ SOLN
1.0000 mg | Freq: Once | INTRAMUSCULAR | Status: AC
  Administered 2014-05-27: 1 mg via INTRAVENOUS
  Filled 2014-05-27: qty 1

## 2014-05-27 MED ORDER — CIPROFLOXACIN HCL 500 MG PO TABS
500.0000 mg | ORAL_TABLET | Freq: Two times a day (BID) | ORAL | Status: DC
  Administered 2014-05-27: 500 mg via ORAL
  Filled 2014-05-27: qty 1

## 2014-05-27 MED ORDER — AMLODIPINE BESYLATE 10 MG PO TABS: 10.0000 mg | ORAL_TABLET | Freq: Every day | ORAL | Status: DC

## 2014-05-27 MED ORDER — DOCUSATE SODIUM 50 MG PO CAPS
50.0000 mg | ORAL_CAPSULE | Freq: Two times a day (BID) | ORAL | Status: DC
  Filled 2014-05-27 (×2): qty 1

## 2014-05-27 MED ORDER — DOCUSATE SODIUM 50 MG/5ML PO LIQD
50.0000 mg | Freq: Two times a day (BID) | ORAL | Status: DC
  Filled 2014-05-27 (×2): qty 10

## 2014-05-27 MED ORDER — POLYETHYLENE GLYCOL 3350 17 G PO PACK
17.0000 g | PACK | Freq: Every day | ORAL | Status: DC | PRN
Start: 2014-05-27 — End: 2014-05-27

## 2014-05-27 NOTE — Discharge Instructions (Signed)
You were admitted for pyelonephritis (infection in the kidneys).  You were initially treated with IV antibiotics and transitioned to oral.  Please make sure that you take this medication as directed AWAY from milk/milk products and multivitamins.  You are to follow up with your urologist, Dr [name], within the next week.  Also, please plan to see your regular primary doctor for continued pain medication/management.  Please DO NOT operate heavy machinery while taking your pain medications.  Take them EXACTLY as prescribed.  Taking too much pain medication can cause breathing difficulties and be lethal.  You are being discharged on the following NEW medications  Amlodipine 10mg  (blood pressure)  MS IR 30mg  (breathrough pain medication)  Seek IMMEDIATE medical attention if:  You have a fever or lasting symptoms for more than 2-3 days.  You have a fever and your symptoms suddenly get worse.  You cannot take your medicine or drink fluids as told.  You have chills and shaking.  You feel very weak or pass out (faint).  You do not feel better after 2 days.  Pyelonephritis, Adult Pyelonephritis is a kidney infection. A kidney infection can happen quickly, or it can last for a long time. HOME CARE   Take your medicine (antibiotics) as told. Finish it even if you start to feel better.  Keep all doctor visits as told.  Drink enough fluids to keep your pee (urine) clear or pale yellow.  Only take medicine as told by your doctor. GET HELP RIGHT AWAY IF:   You have a fever or lasting symptoms for more than 2-3 days.  You have a fever and your symptoms suddenly get worse.  You cannot take your medicine or drink fluids as told.  You have chills and shaking.  You feel very weak or pass out (faint).  You do not feel better after 2 days. MAKE SURE YOU:  Understand these instructions.  Will watch your condition.  Will get help right away if you are not doing well or get  worse. Document Released: 05/19/2004 Document Revised: 10/11/2011 Document Reviewed: 09/29/2010 [facility] Patient Information 2015 [place], [place]. This information is not intended to replace advice given to you by your health care provider. Make sure you discuss any questions you have with your health care provider.

## 2014-05-27 NOTE — Progress Notes (Signed)
**  Interval note**  Spoke to urologist on call.  Discussed patient.  Urology recommending PO therapy with Cipro 500mg  BID for a total abx therapy of 10 days, with close follow up.  Shanna Strength M. Lajuana Ripple, DO PGY-1, [place]

## 2014-05-27 NOTE — Clinical Documentation Improvement (Signed)
  Query #2  "Acute blood loss anemia: Microcytic w/ Hgb 9.9 > 8.6> 8.4> 8.2. No signs of bleeding - Continue to monitor" documented in current medical record.  H&H this admission: Component     Latest Ref Rng 05/24/2014 05/25/2014 05/26/2014 05/27/2014  Hemoglobin     13.0 - 17.0 g/dL 9.9 (L) 8.6 (L) 8.4 (L) 8.2 (L)  HCT     39.0 - 52.0 % 29.8 (L) 25.9 (L) 25.7 (L) 25.2 (L)    Please clarify if "Acute blood loss anemia" is applicable to this admission.    If not applicable, please document the Type of Anemia monitored this admission:   - Anemia of Chronic Illness   - Other Type of Anemia   - Unable to Clinically Determine   Thank You, Erling Conte ,RN Clinical Documentation Specialist:  [phone] [place] Information Management
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
## 2014-05-27 NOTE — Progress Notes (Signed)
Explained entire situation to family and leave pain control to primary team- THANKS Agree  With diagnosis and discharge i will follow as outpt At pat request mom is going to bring me a urine c/s once per week
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
## 2014-05-27 NOTE — Progress Notes (Signed)
D/c to home via wheelchair with mother. VSS. Pain med given at d/c.  Instructions given and rx's given and patient understood

## 2014-05-28 ENCOUNTER — Telehealth: Payer: Self-pay

## 2014-05-28 NOTE — Telephone Encounter (Signed)
Post-discharge Follow-Up Phone Call:  Date of Discharge: 05/27/14 Principal Discharge Diagnosis: Sepsis, Pyelonephritis, HTN, Generalized abdominal pain, fracture of multiple pubic rami Post-discharge communication: Call placed to patient for post-discharge follow-up, to determine if patient obtained discharge medications, and to discuss need for follow-up appointment with Dr. Annitta Needs. Unable to reach patient; left voicemail requesting return call.  Awaiting call back from patient.

## 2014-05-30 ENCOUNTER — Telehealth: Payer: Self-pay

## 2014-05-30 NOTE — Telephone Encounter (Signed)
Post-discharge Follow-Up Phone Call:  Date of Discharge: 05/27/14 Principal Discharge Diagnosis: Sepsis, pyelonephritis, hypertension, generalized abdominal pain     Please check all that apply: X  Patient is knowledgeable of his/her condition(s) and/or treatment. ? Family and/or caregiver is knowledgeable of patient's condition(s) and/or treatment. X  Patient is caring for self at home. X  Patient is receiving home health services. If so, name of agency.    Medication Reconciliation:  X  Medication list reviewed with patient. X  Patient has not obtained all discharge medications. Patient indicates he picked up all discharge medications except morphine 60 mg 12 hour tablets and morphine 30 mg tablets because he was unable to afford the pain medications.  He indicates he plans to pick up these medications today.  He has been taking Ciprofloxacin 500 mg tablet twice daily and amlodipine 10 mg daily as prescribed.   Community resources in place for patient:  ? None  X  Home Health-Patient has [place] RN with [facility] for dressing changes, vital signs, and symptom management  ? Assisted Living ? Hospice ? Support Group           Questions/Concerns discussed:  Patient indicates he goes to Urologist on Fridays for urinalysis. Instructed patient to make sure he keeps his Urologist appointment today for urinalysis. Patient verbalized understanding.  Discussed need for hospital follow-up appointment with Dr. Annitta Needs and appointment scheduled for 06/05/14 at 1000.  Patient instructed to take all medications as prescribed and to pick up pain medications.  Patient verbalized understanding.

## 2014-05-31 LAB — CULTURE, BLOOD (ROUTINE X 2)
Culture: NO GROWTH
Culture: NO GROWTH

## 2014-06-03 ENCOUNTER — Other Ambulatory Visit (HOSPITAL_COMMUNITY): Payer: Self-pay

## 2014-06-03 ENCOUNTER — Telehealth: Payer: Self-pay | Admitting: Internal Medicine

## 2014-06-03 MED ORDER — MORPHINE SULFATE 30 MG PO TABS: 30.0000 mg | ORAL_TABLET | ORAL | Status: DC | PRN

## 2014-06-03 NOTE — Telephone Encounter (Signed)
[facility] called to say he was out of MSIR and valium. I confirmed with pharmacy that he had filled both of those on 2/2 and therefore should not be out of valium but could have legitimately used all the MSIR. He has an appointment with PCP on 2/11 who is supposed to be taking over management per IM d/c summary. Will give refill of MSIR 30mg  #12 to get him to that appointment.

## 2014-06-03 NOTE — Telephone Encounter (Signed)
Patients mother called to speak to nurse about patients current medications, please f/u with pt.

## 2014-06-05 ENCOUNTER — Inpatient Hospital Stay: Payer: Self-pay | Admitting: Internal Medicine

## 2014-06-09 ENCOUNTER — Emergency Department (HOSPITAL_COMMUNITY)
Admission: EM | Admit: 2014-06-09 | Discharge: 2014-06-09 | Disposition: A | Discharge status: Home / Self Care | Payer: Self-pay | Attending: Emergency Medicine | Admitting: Emergency Medicine

## 2014-06-09 ENCOUNTER — Encounter (HOSPITAL_COMMUNITY): Payer: Self-pay

## 2014-06-09 ENCOUNTER — Emergency Department (HOSPITAL_COMMUNITY): Payer: Self-pay

## 2014-06-09 DIAGNOSIS — Z79899 Other long term (current) drug therapy: Secondary | ICD-10-CM | POA: Insufficient documentation

## 2014-06-09 DIAGNOSIS — I10 Essential (primary) hypertension: Secondary | ICD-10-CM | POA: Insufficient documentation

## 2014-06-09 DIAGNOSIS — Z9889 Other specified postprocedural states: Secondary | ICD-10-CM | POA: Insufficient documentation

## 2014-06-09 DIAGNOSIS — Z720 Tobacco use: Secondary | ICD-10-CM | POA: Insufficient documentation

## 2014-06-09 DIAGNOSIS — J45909 Unspecified asthma, uncomplicated: Secondary | ICD-10-CM | POA: Insufficient documentation

## 2014-06-09 DIAGNOSIS — Z86718 Personal history of other venous thrombosis and embolism: Secondary | ICD-10-CM | POA: Insufficient documentation

## 2014-06-09 DIAGNOSIS — N3090 Cystitis, unspecified without hematuria: Secondary | ICD-10-CM

## 2014-06-09 DIAGNOSIS — Z7901 Long term (current) use of anticoagulants: Secondary | ICD-10-CM | POA: Insufficient documentation

## 2014-06-09 LAB — CBC WITH DIFFERENTIAL/PLATELET
Basophils Absolute: 0 10*3/uL (ref 0.0–0.1)
Basophils Relative: 0 % (ref 0–1)
EOS ABS: 0.2 10*3/uL (ref 0.0–0.7)
EOS PCT: 3 % (ref 0–5)
HCT: 29.9 % — ABNORMAL LOW (ref 39.0–52.0)
Hemoglobin: 9.9 g/dL — ABNORMAL LOW (ref 13.0–17.0)
LYMPHS PCT: 22 % (ref 12–46)
Lymphs Abs: 1.8 10*3/uL (ref 0.7–4.0)
MCH: 23.7 pg — ABNORMAL LOW (ref 26.0–34.0)
MCHC: 33.1 g/dL (ref 30.0–36.0)
MCV: 71.7 fL — ABNORMAL LOW (ref 78.0–100.0)
Monocytes Absolute: 0.5 10*3/uL (ref 0.1–1.0)
Monocytes Relative: 6 % (ref 3–12)
Neutro Abs: 5.9 10*3/uL (ref 1.7–7.7)
Neutrophils Relative %: 69 % (ref 43–77)
PLATELETS: 532 10*3/uL — AB (ref 150–400)
RBC: 4.17 MIL/uL — ABNORMAL LOW (ref 4.22–5.81)
RDW: 18.5 % — ABNORMAL HIGH (ref 11.5–15.5)
WBC: 8.5 10*3/uL (ref 4.0–10.5)

## 2014-06-09 LAB — COMPREHENSIVE METABOLIC PANEL
ALK PHOS: 81 U/L (ref 39–117)
ALT: 9 U/L (ref 0–53)
AST: 18 U/L (ref 0–37)
Albumin: 2.6 g/dL — ABNORMAL LOW (ref 3.5–5.2)
Anion gap: 7 (ref 5–15)
CO2: 28 mmol/L (ref 19–32)
Calcium: 8.9 mg/dL (ref 8.4–10.5)
Chloride: 100 mmol/L (ref 96–112)
Creatinine, Ser: 0.57 mg/dL (ref 0.50–1.35)
GFR calc non Af Amer: >90 mL/min (ref >90)
Glucose, Bld: 96 mg/dL (ref 70–99)
Potassium: 3.4 mmol/L — ABNORMAL LOW (ref 3.5–5.1)
SODIUM: 135 mmol/L (ref 135–145)
Total Bilirubin: 0.1 mg/dL — ABNORMAL LOW (ref 0.3–1.2)
Total Protein: 8.3 g/dL (ref 6.0–8.3)

## 2014-06-09 LAB — URINE MICROSCOPIC-ADD ON

## 2014-06-09 LAB — URINALYSIS, ROUTINE W REFLEX MICROSCOPIC
BILIRUBIN URINE: NEGATIVE
GLUCOSE, UA: NEGATIVE mg/dL
Ketones, ur: NEGATIVE mg/dL
Nitrite: NEGATIVE
PROTEIN: 100 mg/dL — AB
Specific Gravity, Urine: 1.018 (ref 1.005–1.030)
Urobilinogen, UA: 1 mg/dL (ref 0.0–1.0)
pH: 6.5 (ref 5.0–8.0)

## 2014-06-09 LAB — LIPASE, BLOOD: Lipase: 17 U/L (ref 11–59)

## 2014-06-09 MED ORDER — HYDROMORPHONE HCL 1 MG/ML IJ SOLN
1.0000 mg | Freq: Once | INTRAMUSCULAR | Status: AC
  Administered 2014-06-09: 1 mg via INTRAVENOUS
  Filled 2014-06-09: qty 1

## 2014-06-09 MED ORDER — CEFTRIAXONE SODIUM 1 G IJ SOLR
1.0000 g | Freq: Once | INTRAMUSCULAR | Status: AC
  Administered 2014-06-09: 1 g via INTRAMUSCULAR
  Filled 2014-06-09: qty 10

## 2014-06-09 MED ORDER — CEPHALEXIN 500 MG PO CAPS: 500.0000 mg | ORAL_CAPSULE | Freq: Four times a day (QID) | ORAL | Status: DC

## 2014-06-09 MED ORDER — HYDROMORPHONE HCL 1 MG/ML IJ SOLN
1.0000 mg | Freq: Once | INTRAMUSCULAR | Status: AC
Start: 2014-06-09 — End: 2014-06-09
  Administered 2014-06-09: 1 mg via INTRAVENOUS
  Filled 2014-06-09: qty 1

## 2014-06-09 MED ORDER — LIDOCAINE HCL 2 % IJ SOLN
10.0000 mL | Freq: Once | INTRAMUSCULAR | Status: AC
  Administered 2014-06-09: 200 mg via INTRADERMAL
  Filled 2014-06-09: qty 20

## 2014-06-09 NOTE — ED Notes (Signed)
NO reaction to Rocephin noted.

## 2014-06-09 NOTE — ED Notes (Signed)
Suprapubic bag emptied for new specimen to be received for Urinalysis.

## 2014-06-09 NOTE — Discharge Instructions (Signed)
You were evaluated in the ED today for your abdominal discomfort. You were found to have cystitis, a mild infection in your bladder. You were given antibiotics in the ED and will be given antibiotics for home. Please take his antibiotics as directed. Please follow-up with your urologist within 48 hours for further evaluation and management of your symptoms. Return to ED for new or worsening symptoms.

## 2014-06-09 NOTE — ED Notes (Signed)
MD at the bedside  
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
## 2014-06-09 NOTE — ED Notes (Signed)
No urine found in Suprapubic Bag.

## 2014-06-10 ENCOUNTER — Ambulatory Visit: Payer: MEDICAID | Attending: Internal Medicine | Admitting: Internal Medicine

## 2014-06-10 ENCOUNTER — Encounter: Payer: Self-pay | Admitting: Internal Medicine

## 2014-06-10 VITALS — BP 146/97 | HR 76 | Temp 99.2°F | Resp 15

## 2014-06-10 DIAGNOSIS — S62306S Unspecified fracture of fifth metacarpal bone, right hand, sequela: Secondary | ICD-10-CM

## 2014-06-10 DIAGNOSIS — R52 Pain, unspecified: Secondary | ICD-10-CM

## 2014-06-10 DIAGNOSIS — N390 Urinary tract infection, site not specified: Secondary | ICD-10-CM

## 2014-06-10 DIAGNOSIS — Z8781 Personal history of (healed) traumatic fracture: Secondary | ICD-10-CM

## 2014-06-10 LAB — URINE CULTURE
COLONY COUNT: NO GROWTH
Culture: NO GROWTH

## 2014-06-10 MED ORDER — ACETAMINOPHEN-CODEINE #3 300-30 MG PO TABS: 1.0000 | ORAL_TABLET | Freq: Three times a day (TID) | ORAL | Status: DC | PRN

## 2014-06-10 MED ORDER — TRAMADOL HCL 50 MG PO TABS: 50.0000 mg | ORAL_TABLET | Freq: Three times a day (TID) | ORAL | Status: DC | PRN

## 2014-06-10 MED ORDER — METHOCARBAMOL 500 MG PO TABS: 500.0000 mg | ORAL_TABLET | Freq: Four times a day (QID) | ORAL | Status: DC | PRN
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
## 2014-06-10 NOTE — Progress Notes (Signed)
Patient here for follow up from [place] Patient complains of being in a lot of pain Recently discharged from the hospital for bladder infection

## 2014-06-12 NOTE — Telephone Encounter (Signed)
Spoke with mother who wasn't sure why Carlos Lawrence had called. I told her to call back if they had more questions.

## 2014-06-14 ENCOUNTER — Emergency Department (HOSPITAL_COMMUNITY)
Admission: EM | Admit: 2014-06-14 | Discharge: 2014-06-14 | Disposition: A | Discharge status: Home / Self Care | Payer: Self-pay | Attending: Emergency Medicine | Admitting: Emergency Medicine

## 2014-06-14 ENCOUNTER — Encounter (HOSPITAL_COMMUNITY): Payer: Self-pay

## 2014-06-14 DIAGNOSIS — T85698A Other mechanical complication of other specified internal prosthetic devices, implants and grafts, initial encounter: Secondary | ICD-10-CM

## 2014-06-14 DIAGNOSIS — N99522 Malfunction of other external stoma of urinary tract: Secondary | ICD-10-CM | POA: Insufficient documentation

## 2014-06-14 DIAGNOSIS — J45909 Unspecified asthma, uncomplicated: Secondary | ICD-10-CM | POA: Insufficient documentation

## 2014-06-14 DIAGNOSIS — Z9889 Other specified postprocedural states: Secondary | ICD-10-CM | POA: Insufficient documentation

## 2014-06-14 DIAGNOSIS — I10 Essential (primary) hypertension: Secondary | ICD-10-CM | POA: Insufficient documentation

## 2014-06-14 DIAGNOSIS — Z86718 Personal history of other venous thrombosis and embolism: Secondary | ICD-10-CM | POA: Insufficient documentation

## 2014-06-14 DIAGNOSIS — Z792 Long term (current) use of antibiotics: Secondary | ICD-10-CM | POA: Insufficient documentation

## 2014-06-14 DIAGNOSIS — Z720 Tobacco use: Secondary | ICD-10-CM | POA: Insufficient documentation

## 2014-06-14 DIAGNOSIS — Z79899 Other long term (current) drug therapy: Secondary | ICD-10-CM | POA: Insufficient documentation

## 2014-06-14 DIAGNOSIS — R1031 Right lower quadrant pain: Secondary | ICD-10-CM

## 2014-06-14 MED ORDER — HYDROMORPHONE HCL 1 MG/ML IJ SOLN
1.0000 mg | Freq: Once | INTRAMUSCULAR | Status: AC
  Administered 2014-06-14: 1 mg via INTRAVENOUS
  Filled 2014-06-14: qty 1
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
## 2014-06-14 NOTE — ED Notes (Signed)
JP drain is leaking, pt. Has tenderness around the site. Denies any n/v/d

## 2014-06-16 ENCOUNTER — Telehealth (HOSPITAL_COMMUNITY): Payer: Self-pay
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
## 2014-06-22 ENCOUNTER — Encounter (HOSPITAL_COMMUNITY): Payer: Self-pay | Admitting: Emergency Medicine

## 2014-06-22 ENCOUNTER — Inpatient Hospital Stay (HOSPITAL_COMMUNITY)
Admission: EM | Admit: 2014-06-22 | Discharge: 2014-06-28 | DRG: 698 | Disposition: A | Discharge status: Home / Self Care | Payer: Self-pay | Attending: Internal Medicine | Admitting: Internal Medicine

## 2014-06-22 ENCOUNTER — Emergency Department (HOSPITAL_COMMUNITY): Payer: Self-pay

## 2014-06-22 DIAGNOSIS — Z8614 Personal history of Methicillin resistant Staphylococcus aureus infection: Secondary | ICD-10-CM

## 2014-06-22 DIAGNOSIS — T83028A Displacement of other indwelling urethral catheter, initial encounter: Secondary | ICD-10-CM | POA: Diagnosis present

## 2014-06-22 DIAGNOSIS — T8359XA Infection and inflammatory reaction due to prosthetic device, implant and graft in urinary system, initial encounter: Principal | ICD-10-CM | POA: Diagnosis present

## 2014-06-22 DIAGNOSIS — T83022A Displacement of nephrostomy catheter, initial encounter: Secondary | ICD-10-CM

## 2014-06-22 DIAGNOSIS — E876 Hypokalemia: Secondary | ICD-10-CM | POA: Diagnosis present

## 2014-06-22 DIAGNOSIS — G8929 Other chronic pain: Secondary | ICD-10-CM | POA: Diagnosis present

## 2014-06-22 DIAGNOSIS — Z933 Colostomy status: Secondary | ICD-10-CM

## 2014-06-22 DIAGNOSIS — W3400XA Accidental discharge from unspecified firearms or gun, initial encounter: Secondary | ICD-10-CM

## 2014-06-22 DIAGNOSIS — N12 Tubulo-interstitial nephritis, not specified as acute or chronic: Secondary | ICD-10-CM | POA: Diagnosis present

## 2014-06-22 DIAGNOSIS — Z86718 Personal history of other venous thrombosis and embolism: Secondary | ICD-10-CM

## 2014-06-22 DIAGNOSIS — D638 Anemia in other chronic diseases classified elsewhere: Secondary | ICD-10-CM | POA: Diagnosis present

## 2014-06-22 DIAGNOSIS — R109 Unspecified abdominal pain: Secondary | ICD-10-CM | POA: Diagnosis present

## 2014-06-22 DIAGNOSIS — S31139A Puncture wound of abdominal wall without foreign body, unspecified quadrant without penetration into peritoneal cavity, initial encounter: Secondary | ICD-10-CM

## 2014-06-22 DIAGNOSIS — R1011 Right upper quadrant pain: Secondary | ICD-10-CM

## 2014-06-22 DIAGNOSIS — I82401 Acute embolism and thrombosis of unspecified deep veins of right lower extremity: Secondary | ICD-10-CM

## 2014-06-22 DIAGNOSIS — R319 Hematuria, unspecified: Secondary | ICD-10-CM | POA: Diagnosis present

## 2014-06-22 DIAGNOSIS — R1084 Generalized abdominal pain: Secondary | ICD-10-CM | POA: Diagnosis present

## 2014-06-22 DIAGNOSIS — D649 Anemia, unspecified: Secondary | ICD-10-CM | POA: Diagnosis present

## 2014-06-22 DIAGNOSIS — I10 Essential (primary) hypertension: Secondary | ICD-10-CM | POA: Diagnosis present

## 2014-06-22 DIAGNOSIS — Z79891 Long term (current) use of opiate analgesic: Secondary | ICD-10-CM

## 2014-06-22 DIAGNOSIS — A419 Sepsis, unspecified organism: Secondary | ICD-10-CM | POA: Diagnosis present

## 2014-06-22 DIAGNOSIS — Z22322 Carrier or suspected carrier of Methicillin resistant Staphylococcus aureus: Secondary | ICD-10-CM

## 2014-06-22 DIAGNOSIS — I82409 Acute embolism and thrombosis of unspecified deep veins of unspecified lower extremity: Secondary | ICD-10-CM | POA: Diagnosis present

## 2014-06-22 DIAGNOSIS — J45909 Unspecified asthma, uncomplicated: Secondary | ICD-10-CM

## 2014-06-22 DIAGNOSIS — R32 Unspecified urinary incontinence: Secondary | ICD-10-CM | POA: Insufficient documentation

## 2014-06-22 DIAGNOSIS — F17210 Nicotine dependence, cigarettes, uncomplicated: Secondary | ICD-10-CM | POA: Diagnosis present

## 2014-06-22 DIAGNOSIS — J4520 Mild intermittent asthma, uncomplicated: Secondary | ICD-10-CM

## 2014-06-22 DIAGNOSIS — Z7901 Long term (current) use of anticoagulants: Secondary | ICD-10-CM

## 2014-06-22 DIAGNOSIS — D500 Iron deficiency anemia secondary to blood loss (chronic): Secondary | ICD-10-CM

## 2014-06-22 DIAGNOSIS — S31109S Unspecified open wound of abdominal wall, unspecified quadrant without penetration into peritoneal cavity, sequela: Secondary | ICD-10-CM

## 2014-06-22 DIAGNOSIS — W3400XS Accidental discharge from unspecified firearms or gun, sequela: Secondary | ICD-10-CM

## 2014-06-22 HISTORY — DX: Accidental discharge from unspecified firearms or gun, initial encounter: W34.00XA

## 2014-06-22 LAB — URINALYSIS, ROUTINE W REFLEX MICROSCOPIC
BILIRUBIN URINE: NEGATIVE
BILIRUBIN URINE: NEGATIVE
GLUCOSE, UA: NEGATIVE mg/dL
Glucose, UA: NEGATIVE mg/dL
Glucose, UA: NEGATIVE mg/dL
KETONES UR: NEGATIVE mg/dL
KETONES UR: NEGATIVE mg/dL
Ketones, ur: 15 mg/dL — AB
NITRITE: NEGATIVE
NITRITE: NEGATIVE
Nitrite: NEGATIVE
PH: 6.5 (ref 5.0–8.0)
PROTEIN: 100 mg/dL — AB
Protein, ur: 100 mg/dL — AB
SPECIFIC GRAVITY, URINE: 1.022 (ref 1.005–1.030)
SPECIFIC GRAVITY, URINE: 1.029 (ref 1.005–1.030)
Specific Gravity, Urine: 1.018 (ref 1.005–1.030)
UROBILINOGEN UA: 1 mg/dL (ref 0.0–1.0)
Urobilinogen, UA: 0.2 mg/dL (ref 0.0–1.0)
Urobilinogen, UA: 1 mg/dL (ref 0.0–1.0)
pH: 6 (ref 5.0–8.0)
pH: 6 (ref 5.0–8.0)

## 2014-06-22 LAB — CBC WITH DIFFERENTIAL/PLATELET
Basophils Absolute: 0 10*3/uL (ref 0.0–0.1)
Basophils Relative: 0 % (ref 0–1)
EOS ABS: 0.3 10*3/uL (ref 0.0–0.7)
Eosinophils Relative: 4 % (ref 0–5)
HEMATOCRIT: 31.3 % — AB (ref 39.0–52.0)
HEMOGLOBIN: 10.6 g/dL — AB (ref 13.0–17.0)
Lymphocytes Relative: 30 % (ref 12–46)
Lymphs Abs: 2.1 10*3/uL (ref 0.7–4.0)
MCH: 24.3 pg — ABNORMAL LOW (ref 26.0–34.0)
MCHC: 33.9 g/dL (ref 30.0–36.0)
MCV: 71.8 fL — ABNORMAL LOW (ref 78.0–100.0)
Monocytes Absolute: 0.4 10*3/uL (ref 0.1–1.0)
Monocytes Relative: 6 % (ref 3–12)
NEUTROS PCT: 60 % (ref 43–77)
Neutro Abs: 4.1 10*3/uL (ref 1.7–7.7)
Platelets: 446 10*3/uL — ABNORMAL HIGH (ref 150–400)
RBC: 4.36 MIL/uL (ref 4.22–5.81)
RDW: 18.3 % — ABNORMAL HIGH (ref 11.5–15.5)
WBC: 6.8 10*3/uL (ref 4.0–10.5)

## 2014-06-22 LAB — URINE MICROSCOPIC-ADD ON

## 2014-06-22 LAB — COMPREHENSIVE METABOLIC PANEL
ALK PHOS: 75 U/L (ref 39–117)
ALT: 6 U/L (ref 0–53)
ANION GAP: 9 (ref 5–15)
AST: 14 U/L (ref 0–37)
Albumin: 3.1 g/dL — ABNORMAL LOW (ref 3.5–5.2)
CALCIUM: 9.1 mg/dL (ref 8.4–10.5)
CO2: 27 mmol/L (ref 19–32)
CREATININE: 0.57 mg/dL (ref 0.50–1.35)
Chloride: 100 mmol/L (ref 96–112)
GFR calc non Af Amer: >90 mL/min (ref >90)
GLUCOSE: 88 mg/dL (ref 70–99)
POTASSIUM: 3.2 mmol/L — AB (ref 3.5–5.1)
Sodium: 136 mmol/L (ref 135–145)
TOTAL PROTEIN: 7.9 g/dL (ref 6.0–8.3)
Total Bilirubin: 0.6 mg/dL (ref 0.3–1.2)

## 2014-06-22 MED ORDER — VANCOMYCIN HCL IN DEXTROSE 1-5 GM/200ML-% IV SOLN
1000.0000 mg | Freq: Once | INTRAVENOUS | Status: AC
  Administered 2014-06-22: 1000 mg via INTRAVENOUS
  Filled 2014-06-22: qty 200

## 2014-06-22 MED ORDER — VANCOMYCIN HCL IN DEXTROSE 1-5 GM/200ML-% IV SOLN
1000.0000 mg | Freq: Three times a day (TID) | INTRAVENOUS | Status: DC
  Administered 2014-06-23 – 2014-06-24 (×4): 1000 mg via INTRAVENOUS
  Filled 2014-06-22 (×7): qty 200

## 2014-06-22 MED ORDER — AMLODIPINE BESYLATE 10 MG PO TABS
10.0000 mg | ORAL_TABLET | Freq: Every day | ORAL | Status: DC
  Administered 2014-06-23 – 2014-06-28 (×6): 10 mg via ORAL
  Filled 2014-06-22 (×6): qty 1

## 2014-06-22 MED ORDER — MORPHINE SULFATE 15 MG PO TABS
30.0000 mg | ORAL_TABLET | ORAL | Status: DC | PRN
  Administered 2014-06-22 – 2014-06-23 (×3): 30 mg via ORAL
  Filled 2014-06-22 (×3): qty 2

## 2014-06-22 MED ORDER — ONDANSETRON HCL 4 MG PO TABS: 4.0000 mg | ORAL_TABLET | Freq: Four times a day (QID) | ORAL | Status: DC | PRN

## 2014-06-22 MED ORDER — ACETAMINOPHEN 650 MG RE SUPP
650.0000 mg | Freq: Four times a day (QID) | RECTAL | Status: DC | PRN
Start: 2014-06-22 — End: 2014-06-28

## 2014-06-22 MED ORDER — DOCUSATE SODIUM 50 MG PO CAPS
50.0000 mg | ORAL_CAPSULE | Freq: Two times a day (BID) | ORAL | Status: DC
  Administered 2014-06-22: 50 mg via ORAL
  Filled 2014-06-22 (×3): qty 1

## 2014-06-22 MED ORDER — SODIUM CHLORIDE 0.9 % IV BOLUS (SEPSIS)
1000.0000 mL | Freq: Once | INTRAVENOUS | Status: AC
  Administered 2014-06-22: 1000 mL via INTRAVENOUS

## 2014-06-22 MED ORDER — MORPHINE SULFATE ER 60 MG PO TBCR
120.0000 mg | EXTENDED_RELEASE_TABLET | Freq: Two times a day (BID) | ORAL | Status: DC
  Administered 2014-06-22 – 2014-06-28 (×12): 120 mg via ORAL
  Filled 2014-06-22 (×13): qty 2

## 2014-06-22 MED ORDER — HYDROMORPHONE HCL 1 MG/ML IJ SOLN
0.5000 mg | INTRAMUSCULAR | Status: DC | PRN
  Administered 2014-06-22: 1 mg via INTRAVENOUS
  Filled 2014-06-22: qty 1

## 2014-06-22 MED ORDER — ONDANSETRON HCL 4 MG/2ML IJ SOLN
4.0000 mg | Freq: Four times a day (QID) | INTRAMUSCULAR | Status: DC | PRN
  Administered 2014-06-22: 4 mg via INTRAVENOUS
  Filled 2014-06-22: qty 2

## 2014-06-22 MED ORDER — HYDROMORPHONE HCL 1 MG/ML IJ SOLN
INTRAMUSCULAR | Status: AC
  Administered 2014-06-23: 0.5 mg via INTRAVENOUS
  Filled 2014-06-22: qty 1

## 2014-06-22 MED ORDER — METHOCARBAMOL 500 MG PO TABS
500.0000 mg | ORAL_TABLET | Freq: Four times a day (QID) | ORAL | Status: DC | PRN
  Administered 2014-06-24: 1000 mg via ORAL
  Administered 2014-06-24 – 2014-06-25 (×2): 500 mg via ORAL
  Filled 2014-06-22 (×4): qty 2

## 2014-06-22 MED ORDER — HYDROMORPHONE HCL 1 MG/ML IJ SOLN
0.5000 mg | Freq: Once | INTRAMUSCULAR | Status: AC
  Administered 2014-06-23: 0.5 mg via INTRAVENOUS

## 2014-06-22 MED ORDER — OXYCODONE HCL 5 MG PO TABS: 5.0000 mg | ORAL_TABLET | ORAL | Status: DC | PRN

## 2014-06-22 MED ORDER — MORPHINE SULFATE ER 15 MG PO TBCR
120.0000 mg | EXTENDED_RELEASE_TABLET | Freq: Two times a day (BID) | ORAL | Status: DC
  Filled 2014-06-22: qty 8

## 2014-06-22 MED ORDER — HYDROMORPHONE HCL 1 MG/ML IJ SOLN
1.0000 mg | Freq: Once | INTRAMUSCULAR | Status: AC
  Administered 2014-06-22: 1 mg via INTRAVENOUS
  Filled 2014-06-22: qty 1

## 2014-06-22 MED ORDER — CEFEPIME HCL 2 G IJ SOLR
2.0000 g | Freq: Two times a day (BID) | INTRAMUSCULAR | Status: DC
  Administered 2014-06-23 – 2014-06-25 (×6): 2 g via INTRAVENOUS
  Filled 2014-06-22 (×8): qty 2

## 2014-06-22 MED ORDER — APIXABAN 5 MG PO TABS
5.0000 mg | ORAL_TABLET | Freq: Two times a day (BID) | ORAL | Status: DC
  Administered 2014-06-22 – 2014-06-26 (×8): 5 mg via ORAL
  Filled 2014-06-22 (×9): qty 1

## 2014-06-22 MED ORDER — FENTANYL CITRATE 0.05 MG/ML IJ SOLN
100.0000 ug | Freq: Once | INTRAMUSCULAR | Status: AC
  Administered 2014-06-22: 100 ug via INTRAVENOUS
  Filled 2014-06-22: qty 2

## 2014-06-22 MED ORDER — ONDANSETRON HCL 4 MG/2ML IJ SOLN
4.0000 mg | Freq: Once | INTRAMUSCULAR | Status: AC
  Administered 2014-06-22: 4 mg via INTRAVENOUS
  Filled 2014-06-22: qty 2

## 2014-06-22 MED ORDER — DEXTROSE 5 % IV SOLN
1.0000 g | Freq: Once | INTRAVENOUS | Status: AC
  Administered 2014-06-22: 1 g via INTRAVENOUS
  Filled 2014-06-22: qty 10

## 2014-06-22 MED ORDER — FLUCONAZOLE IN SODIUM CHLORIDE 200-0.9 MG/100ML-% IV SOLN
200.0000 mg | INTRAVENOUS | Status: DC
  Administered 2014-06-22: 200 mg via INTRAVENOUS
  Filled 2014-06-22 (×2): qty 100

## 2014-06-22 MED ORDER — ALUM & MAG HYDROXIDE-SIMETH 200-200-20 MG/5ML PO SUSP: 30.0000 mL | Freq: Four times a day (QID) | ORAL | Status: DC | PRN

## 2014-06-22 MED ORDER — POLYETHYLENE GLYCOL 3350 17 G PO PACK
17.0000 g | PACK | Freq: Every day | ORAL | Status: DC
  Administered 2014-06-24 – 2014-06-28 (×4): 17 g via ORAL
  Filled 2014-06-22 (×6): qty 1

## 2014-06-22 MED ORDER — ACETAMINOPHEN 325 MG PO TABS: 650.0000 mg | ORAL_TABLET | Freq: Four times a day (QID) | ORAL | Status: DC | PRN

## 2014-06-22 MED ORDER — SODIUM CHLORIDE 0.9 % IV SOLN
INTRAVENOUS | Status: DC
  Administered 2014-06-22 – 2014-06-28 (×8): via INTRAVENOUS

## 2014-06-22 NOTE — ED Notes (Signed)
Pt c/o pain to Right lower quadrant and right flank area onset Friday night. Pt with colostomy bag and drainage tube from kidney area. Blood noted in urine, reports onset last night. Pt denies recent injury. Reports nausea and vomiting.
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
## 2014-06-22 NOTE — ED Notes (Signed)
EDP at bedside  
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
## 2014-06-23 ENCOUNTER — Inpatient Hospital Stay (HOSPITAL_COMMUNITY): Payer: Self-pay

## 2014-06-23 DIAGNOSIS — W3400XD Accidental discharge from unspecified firearms or gun, subsequent encounter: Secondary | ICD-10-CM

## 2014-06-23 DIAGNOSIS — S31109D Unspecified open wound of abdominal wall, unspecified quadrant without penetration into peritoneal cavity, subsequent encounter: Secondary | ICD-10-CM

## 2014-06-23 LAB — CBC
HCT: 30.5 % — ABNORMAL LOW (ref 39.0–52.0)
Hemoglobin: 10.3 g/dL — ABNORMAL LOW (ref 13.0–17.0)
MCH: 24.2 pg — ABNORMAL LOW (ref 26.0–34.0)
MCHC: 33.8 g/dL (ref 30.0–36.0)
MCV: 71.6 fL — ABNORMAL LOW (ref 78.0–100.0)
PLATELETS: 391 10*3/uL (ref 150–400)
RBC: 4.26 MIL/uL (ref 4.22–5.81)
RDW: 18.3 % — ABNORMAL HIGH (ref 11.5–15.5)
WBC: 5.7 10*3/uL (ref 4.0–10.5)

## 2014-06-23 LAB — BASIC METABOLIC PANEL
ANION GAP: 6 (ref 5–15)
BUN: <5 mg/dL — ABNORMAL LOW (ref 6–23)
CO2: 27 mmol/L (ref 19–32)
Calcium: 8.5 mg/dL (ref 8.4–10.5)
Chloride: 100 mmol/L (ref 96–112)
Creatinine, Ser: 0.55 mg/dL (ref 0.50–1.35)
Glucose, Bld: 81 mg/dL (ref 70–99)
Potassium: 3.1 mmol/L — ABNORMAL LOW (ref 3.5–5.1)
Sodium: 133 mmol/L — ABNORMAL LOW (ref 135–145)

## 2014-06-23 LAB — MRSA PCR SCREENING: MRSA by PCR: POSITIVE — AB

## 2014-06-23 MED ORDER — POTASSIUM CHLORIDE CRYS ER 20 MEQ PO TBCR
40.0000 meq | EXTENDED_RELEASE_TABLET | Freq: Once | ORAL | Status: AC
  Administered 2014-06-23: 20 meq via ORAL
  Filled 2014-06-23: qty 2

## 2014-06-23 MED ORDER — HYDROMORPHONE HCL 1 MG/ML IJ SOLN
0.5000 mg | Freq: Once | INTRAMUSCULAR | Status: AC
  Administered 2014-06-23: 0.5 mg via INTRAVENOUS
  Filled 2014-06-23: qty 1

## 2014-06-23 MED ORDER — HYDROMORPHONE HCL 1 MG/ML IJ SOLN
1.0000 mg | INTRAMUSCULAR | Status: DC | PRN
  Administered 2014-06-23 – 2014-06-28 (×30): 1 mg via INTRAVENOUS
  Filled 2014-06-23 (×31): qty 1

## 2014-06-23 MED ORDER — FLUCONAZOLE 200 MG PO TABS
200.0000 mg | ORAL_TABLET | Freq: Every day | ORAL | Status: DC
  Administered 2014-06-23 – 2014-06-24 (×2): 200 mg via ORAL
  Filled 2014-06-23 (×3): qty 1

## 2014-06-23 MED ORDER — MORPHINE SULFATE 15 MG PO TABS
30.0000 mg | ORAL_TABLET | ORAL | Status: DC | PRN
  Administered 2014-06-23 – 2014-06-24 (×5): 30 mg via ORAL
  Filled 2014-06-23 (×5): qty 2

## 2014-06-23 MED ORDER — SENNOSIDES-DOCUSATE SODIUM 8.6-50 MG PO TABS
2.0000 | ORAL_TABLET | Freq: Two times a day (BID) | ORAL | Status: DC
Start: 2014-06-23 — End: 2014-06-28
  Administered 2014-06-23: 1 via ORAL
  Administered 2014-06-24 – 2014-06-28 (×9): 2 via ORAL
  Filled 2014-06-23 (×12): qty 2

## 2014-06-23 NOTE — Progress Notes (Signed)
Pt's VSS, personal belongings with pt. CCMD and Elink notified of transfer, monitor removed. Pt transferred to 5w26.
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
## 2014-06-23 NOTE — Progress Notes (Deleted)
Report called to Katharine Look on [place] alert, oriented at baseline, VSS. Elink and CCMD notified, monitor remained on for transfer. Personal belongings with pt.

## 2014-06-23 NOTE — Clinical Social Work Note (Signed)
CSW Consult Acknowledged:   CSW received a consult for Medicaid application. CSW will notify the financial counselor regarding assisting the pt with a Medicaid application.       Roberts, MSW, [place]

## 2014-06-23 NOTE — Progress Notes (Signed)
Called report to [place] on [address].

## 2014-06-23 NOTE — Progress Notes (Signed)
Patient transferred from ER via stretcher by RN. Oriented to unit and room, instructed on callbell and placed at side. Belongings at side. Patient's cousin at bedside.

## 2014-06-23 NOTE — Progress Notes (Signed)
Spoke to [employer] in Infection Prevention, says pt should be swabbed for MRSA since it has been almost 30 days since last admission. Will swab and wait for results.
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
## 2014-06-24 ENCOUNTER — Inpatient Hospital Stay (HOSPITAL_COMMUNITY): Payer: Self-pay

## 2014-06-24 DIAGNOSIS — I82409 Acute embolism and thrombosis of unspecified deep veins of unspecified lower extremity: Secondary | ICD-10-CM

## 2014-06-24 LAB — BASIC METABOLIC PANEL
ANION GAP: 10 (ref 5–15)
BUN: <5 mg/dL — ABNORMAL LOW (ref 6–23)
CO2: 29 mmol/L (ref 19–32)
CREATININE: 0.58 mg/dL (ref 0.50–1.35)
Calcium: 8.6 mg/dL (ref 8.4–10.5)
Chloride: 98 mmol/L (ref 96–112)
GFR calc Af Amer: >90 mL/min (ref >90)
GFR calc non Af Amer: >90 mL/min (ref >90)
GLUCOSE: 95 mg/dL (ref 70–99)
Potassium: 3.1 mmol/L — ABNORMAL LOW (ref 3.5–5.1)
SODIUM: 137 mmol/L (ref 135–145)

## 2014-06-24 LAB — CBC
HCT: 29.5 % — ABNORMAL LOW (ref 39.0–52.0)
HEMOGLOBIN: 9.8 g/dL — AB (ref 13.0–17.0)
MCH: 24.3 pg — AB (ref 26.0–34.0)
MCHC: 33.2 g/dL (ref 30.0–36.0)
MCV: 73 fL — ABNORMAL LOW (ref 78.0–100.0)
PLATELETS: 399 10*3/uL (ref 150–400)
RBC: 4.04 MIL/uL — AB (ref 4.22–5.81)
RDW: 18.4 % — AB (ref 11.5–15.5)
WBC: 6 10*3/uL (ref 4.0–10.5)

## 2014-06-24 MED ORDER — FENTANYL CITRATE 0.05 MG/ML IJ SOLN
INTRAMUSCULAR | Status: AC
Start: 2014-06-24 — End: 2014-06-24
  Filled 2014-06-24: qty 4

## 2014-06-24 MED ORDER — POTASSIUM CHLORIDE CRYS ER 20 MEQ PO TBCR
40.0000 meq | EXTENDED_RELEASE_TABLET | Freq: Once | ORAL | Status: AC
  Administered 2014-06-24: 40 meq via ORAL
  Filled 2014-06-24: qty 2

## 2014-06-24 MED ORDER — MORPHINE SULFATE 15 MG PO TABS
45.0000 mg | ORAL_TABLET | ORAL | Status: DC | PRN
  Administered 2014-06-24 – 2014-06-28 (×23): 45 mg via ORAL
  Filled 2014-06-24 (×23): qty 3

## 2014-06-24 MED ORDER — IOHEXOL 300 MG/ML  SOLN
50.0000 mL | Freq: Once | INTRAMUSCULAR | Status: AC | PRN
  Administered 2014-06-24: 1 mL via INTRAVENOUS

## 2014-06-24 MED ORDER — LIDOCAINE HCL 1 % IJ SOLN
INTRAMUSCULAR | Status: AC
  Filled 2014-06-24: qty 20

## 2014-06-24 MED ORDER — FENTANYL CITRATE 0.05 MG/ML IJ SOLN
INTRAMUSCULAR | Status: AC | PRN
  Administered 2014-06-24 (×2): 50 ug via INTRAVENOUS
  Administered 2014-06-24: 100 ug via INTRAVENOUS

## 2014-06-24 MED ORDER — MIDAZOLAM HCL 2 MG/2ML IJ SOLN
INTRAMUSCULAR | Status: AC
  Filled 2014-06-24: qty 4

## 2014-06-24 MED ORDER — MIDAZOLAM HCL 2 MG/2ML IJ SOLN
INTRAMUSCULAR | Status: AC | PRN
  Administered 2014-06-24: 2 mg via INTRAVENOUS
  Administered 2014-06-24: 1 mg via INTRAVENOUS

## 2014-06-24 NOTE — Progress Notes (Signed)
Pt s/p exchange of right PCN, required significant sedation. Pt would not allow (L)PCN exchange. Pt reportedly requesting general anesthesia if left PCN is to be exchanged. (L)nephrostogram showed (L)stent/ureter to be patent with no delay of contrast to bladder. Recommend Urology follow up/consult to help determine if (L)PCN is even needed at this point.  Ascencion Dike PA-C Interventional Radiology 06/24/2014 1:30 PM

## 2014-06-24 NOTE — Progress Notes (Signed)
Spoke with interventional radiology Plan is to change left perc tube Thursday under GA; try to fix s/p drain; and do cystogram to assess bladder leak Please put NPO tomorrow night

## 2014-06-24 NOTE — Procedures (Signed)
R PCN exchange L nephrostogram  No comp

## 2014-06-24 NOTE — Progress Notes (Signed)
Reviewed chart and spoke with Dr Jeffie Pollock Sorry I was not able to see last night but greatly appreciate medical time i suspect that his acute s/p and rt lower quadrant pain was from a UTI  At home temp as high as 100 and n/v Treat - c/s pending Rt perc tube was a bit but has been changed and Rt flank pain was minimal NOTE: I recommend to have left perc tube changed under anesthesia while in hospital; this will give [place] 2 more months with fresh tubes; i will ask re possibility of new JP drain at pt request but have mixed feeling regarding that NPO at midnight- will speak to primary team and interventional He needs both perc tubes since his bladder is still leaking and we need to maximize urine diversion

## 2014-06-24 NOTE — Progress Notes (Signed)
Walked into patient room and found patient emptying his drainage bags in one container.Explained to patient that staff needed to empty his drainage bags so we could put right drainage amounts under each drainage system.Patient verbalized understanding.
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
## 2014-06-25 DIAGNOSIS — N390 Urinary tract infection, site not specified: Secondary | ICD-10-CM

## 2014-06-25 DIAGNOSIS — R32 Unspecified urinary incontinence: Secondary | ICD-10-CM | POA: Insufficient documentation

## 2014-06-25 LAB — BASIC METABOLIC PANEL
Anion gap: 8 (ref 5–15)
CALCIUM: 8.7 mg/dL (ref 8.4–10.5)
CO2: 24 mmol/L (ref 19–32)
CREATININE: 0.57 mg/dL (ref 0.50–1.35)
Chloride: 102 mmol/L (ref 96–112)
GFR calc Af Amer: >90 mL/min (ref >90)
GFR calc non Af Amer: >90 mL/min (ref >90)
GLUCOSE: 103 mg/dL — AB (ref 70–99)
Potassium: 3.5 mmol/L (ref 3.5–5.1)
Sodium: 134 mmol/L — ABNORMAL LOW (ref 135–145)

## 2014-06-25 LAB — CBC
HCT: 27.7 % — ABNORMAL LOW (ref 39.0–52.0)
Hemoglobin: 9.2 g/dL — ABNORMAL LOW (ref 13.0–17.0)
MCH: 23.8 pg — ABNORMAL LOW (ref 26.0–34.0)
MCHC: 33.2 g/dL (ref 30.0–36.0)
MCV: 71.8 fL — AB (ref 78.0–100.0)
Platelets: 365 10*3/uL (ref 150–400)
RBC: 3.86 MIL/uL — ABNORMAL LOW (ref 4.22–5.81)
RDW: 18.3 % — AB (ref 11.5–15.5)
WBC: 7.1 10*3/uL (ref 4.0–10.5)

## 2014-06-25 LAB — URINE CULTURE

## 2014-06-25 MED ORDER — FLUCONAZOLE 100 MG PO TABS
100.0000 mg | ORAL_TABLET | Freq: Every day | ORAL | Status: DC
  Administered 2014-06-25 – 2014-06-28 (×4): 100 mg via ORAL
  Filled 2014-06-25 (×4): qty 1

## 2014-06-25 MED ORDER — MUPIROCIN 2 % EX OINT
1.0000 "application " | TOPICAL_OINTMENT | Freq: Two times a day (BID) | CUTANEOUS | Status: DC
  Administered 2014-06-25 – 2014-06-28 (×6): 1 via NASAL
  Filled 2014-06-25: qty 22

## 2014-06-25 MED ORDER — CHLORHEXIDINE GLUCONATE CLOTH 2 % EX PADS
6.0000 | MEDICATED_PAD | Freq: Every day | CUTANEOUS | Status: DC
  Administered 2014-06-26 – 2014-06-28 (×3): 6 via TOPICAL

## 2014-06-25 NOTE — Progress Notes (Signed)
Patient ID: Carlos Lawrence, male   DOB: [birth year], 29 y.o.   MRN: [id number]   IR procedure for L PCN exchange and cystogram with anesthesia has been scheduled with anesthesia for 06/27/14 at 100 pm in IR per anesthesia availability  RN to inform pt Orders in chart

## 2014-06-25 NOTE — Progress Notes (Signed)
IR has assessed pt i recommend to treat yeast in urine with Diflucan
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
## 2014-06-26 DIAGNOSIS — R32 Unspecified urinary incontinence: Secondary | ICD-10-CM

## 2014-06-26 MED ORDER — APIXABAN 5 MG PO TABS
5.0000 mg | ORAL_TABLET | Freq: Two times a day (BID) | ORAL | Status: DC
  Administered 2014-06-27 – 2014-06-28 (×2): 5 mg via ORAL
  Filled 2014-06-26 (×4): qty 1
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
## 2014-06-26 NOTE — Plan of Care (Signed)
Problem: Phase III Progression Outcomes Goal: Pain controlled on oral analgesia Outcome: Progressing Pt has chronic pain

## 2014-06-27 ENCOUNTER — Encounter (HOSPITAL_COMMUNITY): Payer: Self-pay | Admitting: Anesthesiology

## 2014-06-27 ENCOUNTER — Other Ambulatory Visit (HOSPITAL_COMMUNITY): Payer: Self-pay

## 2014-06-27 ENCOUNTER — Inpatient Hospital Stay (HOSPITAL_COMMUNITY): Payer: Self-pay

## 2014-06-27 ENCOUNTER — Encounter (HOSPITAL_COMMUNITY)
Admission: EM | Disposition: A | Discharge status: Home / Self Care | Payer: Self-pay | Source: Home / Self Care | Attending: Internal Medicine

## 2014-06-27 ENCOUNTER — Inpatient Hospital Stay (HOSPITAL_COMMUNITY): Payer: Self-pay | Admitting: Certified Registered"

## 2014-06-27 HISTORY — PX: RADIOLOGY WITH ANESTHESIA: SHX6223

## 2014-06-27 LAB — CBC
HCT: 29.2 % — ABNORMAL LOW (ref 39.0–52.0)
HEMOGLOBIN: 9.7 g/dL — AB (ref 13.0–17.0)
MCH: 24 pg — ABNORMAL LOW (ref 26.0–34.0)
MCHC: 33.2 g/dL (ref 30.0–36.0)
MCV: 72.1 fL — ABNORMAL LOW (ref 78.0–100.0)
PLATELETS: 330 10*3/uL (ref 150–400)
RBC: 4.05 MIL/uL — AB (ref 4.22–5.81)
RDW: 18.5 % — AB (ref 11.5–15.5)
WBC: 5.9 10*3/uL (ref 4.0–10.5)

## 2014-06-27 LAB — BASIC METABOLIC PANEL
ANION GAP: 8 (ref 5–15)
BUN: 5 mg/dL — ABNORMAL LOW (ref 6–23)
CHLORIDE: 104 mmol/L (ref 96–112)
CO2: 25 mmol/L (ref 19–32)
Calcium: 8.9 mg/dL (ref 8.4–10.5)
Creatinine, Ser: 0.63 mg/dL (ref 0.50–1.35)
GFR calc non Af Amer: >90 mL/min (ref >90)
Glucose, Bld: 88 mg/dL (ref 70–99)
Potassium: 3.8 mmol/L (ref 3.5–5.1)
Sodium: 137 mmol/L (ref 135–145)

## 2014-06-27 LAB — PROTIME-INR
INR: 1.08 (ref 0.00–1.49)
PROTHROMBIN TIME: 14.1 s (ref 11.6–15.2)

## 2014-06-27 SURGERY — RADIOLOGY WITH ANESTHESIA
Anesthesia: General

## 2014-06-27 MED ORDER — FENTANYL CITRATE 0.05 MG/ML IJ SOLN
INTRAMUSCULAR | Status: AC
  Filled 2014-06-27: qty 5

## 2014-06-27 MED ORDER — MIDAZOLAM HCL 2 MG/2ML IJ SOLN
INTRAMUSCULAR | Status: AC
  Filled 2014-06-27: qty 2

## 2014-06-27 MED ORDER — LACTATED RINGERS IV SOLN
INTRAVENOUS | Status: DC
  Administered 2014-06-27: 13:00:00 via INTRAVENOUS

## 2014-06-27 MED ORDER — LIDOCAINE HCL (CARDIAC) 20 MG/ML IV SOLN
INTRAVENOUS | Status: DC | PRN
  Administered 2014-06-27: 80 mg via INTRAVENOUS

## 2014-06-27 MED ORDER — OXYCODONE HCL 5 MG/5ML PO SOLN: 5.0000 mg | Freq: Once | ORAL | Status: AC | PRN

## 2014-06-27 MED ORDER — LACTATED RINGERS IV SOLN
INTRAVENOUS | Status: DC | PRN
  Administered 2014-06-27: 14:00:00 via INTRAVENOUS

## 2014-06-27 MED ORDER — PROPOFOL 10 MG/ML IV BOLUS
INTRAVENOUS | Status: DC | PRN
  Administered 2014-06-27: 200 mg via INTRAVENOUS

## 2014-06-27 MED ORDER — NEOSTIGMINE METHYLSULFATE 10 MG/10ML IV SOLN
INTRAVENOUS | Status: DC | PRN
  Administered 2014-06-27: 4 mg via INTRAVENOUS

## 2014-06-27 MED ORDER — IOHEXOL 300 MG/ML  SOLN
100.0000 mL | Freq: Once | INTRAMUSCULAR | Status: AC | PRN
  Administered 2014-06-27: 160 mL

## 2014-06-27 MED ORDER — MIDAZOLAM HCL 5 MG/5ML IJ SOLN
INTRAMUSCULAR | Status: DC | PRN
Start: 2014-06-27 — End: 2014-06-27
  Administered 2014-06-27: 2 mg via INTRAVENOUS

## 2014-06-27 MED ORDER — ONDANSETRON HCL 4 MG/2ML IJ SOLN
INTRAMUSCULAR | Status: DC | PRN
  Administered 2014-06-27: 4 mg via INTRAVENOUS

## 2014-06-27 MED ORDER — OXYCODONE HCL 5 MG PO TABS
ORAL_TABLET | ORAL | Status: AC
  Filled 2014-06-27: qty 1

## 2014-06-27 MED ORDER — ROCURONIUM BROMIDE 100 MG/10ML IV SOLN
INTRAVENOUS | Status: DC | PRN
  Administered 2014-06-27: 30 mg via INTRAVENOUS

## 2014-06-27 MED ORDER — PROPOFOL 10 MG/ML IV BOLUS
INTRAVENOUS | Status: AC
  Filled 2014-06-27: qty 20

## 2014-06-27 MED ORDER — PROMETHAZINE HCL 25 MG/ML IJ SOLN: 6.2500 mg | INTRAMUSCULAR | Status: DC | PRN

## 2014-06-27 MED ORDER — GLYCOPYRROLATE 0.2 MG/ML IJ SOLN
INTRAMUSCULAR | Status: DC | PRN
Start: 2014-06-27 — End: 2014-06-27
  Administered 2014-06-27: 0.6 mg via INTRAVENOUS

## 2014-06-27 MED ORDER — FENTANYL CITRATE 0.05 MG/ML IJ SOLN
INTRAMUSCULAR | Status: DC | PRN
  Administered 2014-06-27: 100 ug via INTRAVENOUS

## 2014-06-27 MED ORDER — OXYCODONE HCL 5 MG PO TABS
5.0000 mg | ORAL_TABLET | Freq: Once | ORAL | Status: AC | PRN
  Administered 2014-06-27: 5 mg via ORAL
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
## 2014-06-27 NOTE — Procedures (Signed)
Procedures:  Left nephrostomy tube change; cystogram Anesthesia:  General Findings:  New 10 Fr left PCN placed over wire and formed in renal pelvis. Cystogram shows no further evidence of bladder leak.  150 mL diluted contrast instilled by gravity into bladder.

## 2014-06-27 NOTE — Transfer of Care (Signed)
Immediate Anesthesia Transfer of Care Note  Patient: Carlos Lawrence  Procedure(s) Performed: Procedure(s) with comments: RADIOLOGY WITH ANESTHESIA (N/A) - DR. YAMAGATA PERFORMING  Patient Location: PACU  Anesthesia Type:General  Level of Consciousness: awake, alert , oriented and patient cooperative  Airway & Oxygen Therapy: Patient Spontanous Breathing and Patient connected to nasal cannula oxygen  Post-op Assessment: Report given to RN and Post -op Vital signs reviewed and stable  Post vital signs: Reviewed and stable  Last Vitals:  Filed Vitals:   06/27/14 0549  BP: 117/78  Pulse: 76  Temp: 37.3 C  Resp: 20    Complications: No apparent anesthesia complications

## 2014-06-27 NOTE — Anesthesia Preprocedure Evaluation (Addendum)
Anesthesia Evaluation  Patient identified by MRN, date of birth, ID band Patient awake    Reviewed: Allergy & Precautions, NPO status , Patient's Chart, lab work & pertinent test results  History of Anesthesia Complications Negative for: history of anesthetic complications  Airway Mallampati: II  TM Distance: >3 FB Neck ROM: Full    Dental  (+) Teeth Intact, Dental Advisory Given   Pulmonary asthma , Current Smoker,    Pulmonary exam normal       Cardiovascular hypertension, Pt. on medications + Peripheral Vascular Disease     Neuro/Psych negative neurological ROS  negative psych ROS   GI/Hepatic negative GI ROS, Neg liver ROS,   Endo/Other  negative endocrine ROS  Renal/GU      Musculoskeletal negative musculoskeletal ROS (+)   Abdominal   Peds  Hematology   Anesthesia Other Findings   Reproductive/Obstetrics                            Anesthesia Physical Anesthesia Plan  ASA: III  Anesthesia Plan: General   Post-op Pain Management:    Induction: Intravenous  Airway Management Planned: Oral ETT and LMA  Additional Equipment:   Intra-op Plan:   Post-operative Plan: Extubation in OR  Informed Consent: I have reviewed the patients History and Physical, chart, labs and discussed the procedure including the risks, benefits and alternatives for the proposed anesthesia with the patient or authorized representative who has indicated his/her understanding and acceptance.   Dental advisory given  Plan Discussed with:   Anesthesia Plan Comments:        Anesthesia Quick Evaluation

## 2014-06-27 NOTE — Anesthesia Postprocedure Evaluation (Signed)
Anesthesia Post Note  Patient: Carlos Lawrence  Procedure(s) Performed: Procedure(s) (LRB): RADIOLOGY WITH ANESTHESIA (N/A)  Anesthesia type: general  Patient location: PACU  Post pain: Pain level controlled  Post assessment: Patient's Cardiovascular Status Stable  Last Vitals:  Filed Vitals:   06/27/14 1609  BP: 130/82  Pulse:   Temp: 36.8 C  Resp: 16    Post vital signs: Reviewed and stable  Level of consciousness: sedated  Complications: No apparent anesthesia complications

## 2014-06-28 MED ORDER — MORPHINE SULFATE ER 60 MG PO TBCR: 120.0000 mg | EXTENDED_RELEASE_TABLET | Freq: Two times a day (BID) | ORAL | Status: DC

## 2014-06-28 MED ORDER — SENNOSIDES-DOCUSATE SODIUM 8.6-50 MG PO TABS: 2.0000 | ORAL_TABLET | Freq: Two times a day (BID) | ORAL | Status: DC

## 2014-06-28 MED ORDER — FLUCONAZOLE 100 MG PO TABS
100.0000 mg | ORAL_TABLET | Freq: Every day | ORAL | Status: DC
Start: 2014-06-29 — End: 2014-08-04

## 2014-06-28 MED ORDER — OXYCODONE-ACETAMINOPHEN 10-325 MG PO TABS: 1.0000 | ORAL_TABLET | ORAL | Status: DC | PRN

## 2014-06-28 NOTE — Progress Notes (Signed)
Carlos Lawrence to be D/C'd home per MD order.  Discussed with the patient and all questions fully answered.    Medication List    STOP taking these medications        cephALEXin 500 MG capsule  Commonly known as:  KEFLEX     ciprofloxacin 500 MG tablet  Commonly known as:  CIPRO     diazepam 10 MG tablet  Commonly known as:  VALIUM     HYDROcodone-acetaminophen 5-325 MG per tablet  Commonly known as:  NORCO/VICODIN     morphine 30 MG tablet  Commonly known as:  MSIR  Replaced by:  morphine 60 MG 12 hr tablet  You also have another medication with the same name that you need to continue taking as instructed.      TAKE these medications        acetaminophen 500 MG tablet  Commonly known as:  TYLENOL  Take 1,000 mg by mouth every 6 (six) hours as needed for pain.     acetaminophen-codeine 300-30 MG per tablet  Commonly known as:  TYLENOL #3  Take 1 tablet by mouth every 8 (eight) hours as needed for moderate pain.     albuterol 108 (90 BASE) MCG/ACT inhaler  Commonly known as:  PROVENTIL HFA;VENTOLIN HFA  Inhale 2 puffs into the lungs every 6 (six) hours as needed for wheezing.     amLODipine 10 MG tablet  Commonly known as:  NORVASC  Take 1 tablet (10 mg total) by mouth daily.     apixaban 5 MG Tabs tablet  Commonly known as:  ELIQUIS  10mg  po bid x7d then 5mg  po bid; start when INR<2     docusate sodium 50 MG capsule  Commonly known as:  COLACE  Take 1 capsule (50 mg total) by mouth 2 (two) times daily.     fluconazole 100 MG tablet  Commonly known as:  DIFLUCAN  Take 1 tablet (100 mg total) by mouth daily.  Start taking on:  06/29/2014     methocarbamol 500 MG tablet  Commonly known as:  ROBAXIN  Take 1-2 tablets (500-1,000 mg total) by mouth every 6 (six) hours as needed for muscle spasms.     morphine 60 MG 12 hr tablet  Commonly known as:  MS CONTIN  Take 2 tablets (120 mg total) by mouth every 12 (twelve) hours.     morphine 60 MG 12 hr tablet   Commonly known as:  MS CONTIN  Take 2 tablets (120 mg total) by mouth every 12 (twelve) hours.     oxyCODONE-acetaminophen 10-325 MG per tablet  Commonly known as:  PERCOCET  Take 1 tablet by mouth every 4 (four) hours as needed for pain.     polyethylene glycol packet  Commonly known as:  MIRALAX / GLYCOLAX  Take 17 g by mouth daily.     senna-docusate 8.6-50 MG per tablet  Commonly known as:  Senokot-S  Take 2 tablets by mouth 2 (two) times daily.     traMADol 50 MG tablet  Commonly known as:  ULTRAM  Take 1 tablet (50 mg total) by mouth every 8 (eight) hours as needed for moderate pain.     Vitamin D (Ergocalciferol) 50000 UNITS Caps capsule  Commonly known as:  DRISDOL  Take 1 capsule (50,000 Units total) by mouth every 7 (seven) days.        VVS, Skin clean, dry and intact without evidence of skin break down, no evidence of skin  tears noted. IV catheter discontinued intact. Site without signs and symptoms of complications. Dressing and pressure applied.  An After Visit Summary was printed and given to the patient.  D/c education completed with patient/family including follow up instructions, medication list, d/c activities limitations if indicated, with other d/c instructions as indicated by MD - patient able to verbalize understanding, all questions fully answered.   Patient instructed to return to ED, call 911, or call MD for any changes in condition.   Patient escorted via [place], and D/C home via private auto.  Lillard Anes A 06/28/2014 5:22 PM
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
## 2014-06-29 LAB — CULTURE, BLOOD (ROUTINE X 2)
Culture: NO GROWTH
Culture: NO GROWTH

## 2014-06-30 ENCOUNTER — Encounter (HOSPITAL_COMMUNITY): Payer: Self-pay | Admitting: Radiology

## 2014-07-01 ENCOUNTER — Ambulatory Visit: Payer: Self-pay | Attending: Family Medicine | Admitting: Family Medicine

## 2014-07-01 ENCOUNTER — Other Ambulatory Visit: Payer: Self-pay | Admitting: Internal Medicine

## 2014-07-01 VITALS — BP 118/78 | HR 85 | Temp 98.7°F | Resp 16 | Ht 67.0 in | Wt 155.0 lb

## 2014-07-01 DIAGNOSIS — N12 Tubulo-interstitial nephritis, not specified as acute or chronic: Secondary | ICD-10-CM

## 2014-07-01 MED ORDER — METHOCARBAMOL 500 MG PO TABS: 500.0000 mg | ORAL_TABLET | Freq: Four times a day (QID) | ORAL | Status: DC | PRN

## 2014-07-01 NOTE — Patient Instructions (Signed)
Use prescriptions as prescribed.  Keep appointment tomorrow with urologist.

## 2014-07-01 NOTE — Progress Notes (Signed)
Patient here to follow up after his recent admission to hospital. Patient states he is feeling better and needs refills on Tylenol 3, Tramadol, and Robaxin

## 2014-07-07 ENCOUNTER — Other Ambulatory Visit (HOSPITAL_COMMUNITY): Payer: Self-pay | Admitting: Urology

## 2014-07-07 DIAGNOSIS — S3720XA Unspecified injury of bladder, initial encounter: Secondary | ICD-10-CM

## 2014-07-08 ENCOUNTER — Encounter (HOSPITAL_COMMUNITY): Payer: Self-pay | Admitting: Emergency Medicine

## 2014-07-08 ENCOUNTER — Emergency Department (HOSPITAL_COMMUNITY)
Admission: EM | Admit: 2014-07-08 | Discharge: 2014-07-08 | Disposition: A | Discharge status: Home / Self Care | Payer: Self-pay | Attending: Emergency Medicine | Admitting: Emergency Medicine

## 2014-07-08 ENCOUNTER — Emergency Department (HOSPITAL_COMMUNITY): Payer: Self-pay

## 2014-07-08 DIAGNOSIS — Z86718 Personal history of other venous thrombosis and embolism: Secondary | ICD-10-CM | POA: Insufficient documentation

## 2014-07-08 DIAGNOSIS — M25551 Pain in right hip: Secondary | ICD-10-CM | POA: Insufficient documentation

## 2014-07-08 DIAGNOSIS — R109 Unspecified abdominal pain: Secondary | ICD-10-CM | POA: Insufficient documentation

## 2014-07-08 DIAGNOSIS — R1084 Generalized abdominal pain: Secondary | ICD-10-CM

## 2014-07-08 DIAGNOSIS — I10 Essential (primary) hypertension: Secondary | ICD-10-CM | POA: Insufficient documentation

## 2014-07-08 DIAGNOSIS — R52 Pain, unspecified: Secondary | ICD-10-CM

## 2014-07-08 DIAGNOSIS — Z7901 Long term (current) use of anticoagulants: Secondary | ICD-10-CM | POA: Insufficient documentation

## 2014-07-08 DIAGNOSIS — R339 Retention of urine, unspecified: Secondary | ICD-10-CM

## 2014-07-08 DIAGNOSIS — Z79899 Other long term (current) drug therapy: Secondary | ICD-10-CM | POA: Insufficient documentation

## 2014-07-08 DIAGNOSIS — Z9889 Other specified postprocedural states: Secondary | ICD-10-CM | POA: Insufficient documentation

## 2014-07-08 DIAGNOSIS — J45909 Unspecified asthma, uncomplicated: Secondary | ICD-10-CM | POA: Insufficient documentation

## 2014-07-08 DIAGNOSIS — Z720 Tobacco use: Secondary | ICD-10-CM | POA: Insufficient documentation

## 2014-07-08 LAB — CBC WITH DIFFERENTIAL/PLATELET
BASOS PCT: 0 % (ref 0–1)
Basophils Absolute: 0 10*3/uL (ref 0.0–0.1)
Eosinophils Absolute: 0.1 10*3/uL (ref 0.0–0.7)
Eosinophils Relative: 2 % (ref 0–5)
HEMATOCRIT: 34 % — AB (ref 39.0–52.0)
HEMOGLOBIN: 11.1 g/dL — AB (ref 13.0–17.0)
LYMPHS ABS: 1.3 10*3/uL (ref 0.7–4.0)
LYMPHS PCT: 19 % (ref 12–46)
MCH: 23.9 pg — ABNORMAL LOW (ref 26.0–34.0)
MCHC: 32.6 g/dL (ref 30.0–36.0)
MCV: 73.1 fL — ABNORMAL LOW (ref 78.0–100.0)
MONO ABS: 0.3 10*3/uL (ref 0.1–1.0)
MONOS PCT: 4 % (ref 3–12)
NEUTROS ABS: 5.1 10*3/uL (ref 1.7–7.7)
Neutrophils Relative %: 75 % (ref 43–77)
Platelets: 451 10*3/uL — ABNORMAL HIGH (ref 150–400)
RBC: 4.65 MIL/uL (ref 4.22–5.81)
RDW: 18.9 % — ABNORMAL HIGH (ref 11.5–15.5)
WBC: 6.8 10*3/uL (ref 4.0–10.5)

## 2014-07-08 LAB — COMPREHENSIVE METABOLIC PANEL
ALK PHOS: 71 U/L (ref 39–117)
ALT: 8 U/L (ref 0–53)
AST: 16 U/L (ref 0–37)
Albumin: 3.9 g/dL (ref 3.5–5.2)
Anion gap: 8 (ref 5–15)
BUN: <5 mg/dL — ABNORMAL LOW (ref 6–23)
CO2: 24 mmol/L (ref 19–32)
Calcium: 9.3 mg/dL (ref 8.4–10.5)
Chloride: 104 mmol/L (ref 96–112)
Creatinine, Ser: 0.7 mg/dL (ref 0.50–1.35)
GFR calc non Af Amer: >90 mL/min (ref >90)
GLUCOSE: 90 mg/dL (ref 70–99)
POTASSIUM: 3.5 mmol/L (ref 3.5–5.1)
Sodium: 136 mmol/L (ref 135–145)
TOTAL PROTEIN: 8.5 g/dL — AB (ref 6.0–8.3)
Total Bilirubin: 0.3 mg/dL (ref 0.3–1.2)

## 2014-07-08 LAB — URINALYSIS, ROUTINE W REFLEX MICROSCOPIC
Bilirubin Urine: NEGATIVE
Glucose, UA: NEGATIVE mg/dL
Ketones, ur: NEGATIVE mg/dL
Nitrite: NEGATIVE
PROTEIN: 100 mg/dL — AB
SPECIFIC GRAVITY, URINE: 1.017 (ref 1.005–1.030)
UROBILINOGEN UA: 1 mg/dL (ref 0.0–1.0)
pH: 8 (ref 5.0–8.0)

## 2014-07-08 LAB — URINE MICROSCOPIC-ADD ON

## 2014-07-08 LAB — I-STAT CG4 LACTIC ACID, ED: LACTIC ACID, VENOUS: 1.56 mmol/L (ref 0.5–2.0)

## 2014-07-08 LAB — LIPASE, BLOOD: LIPASE: 12 U/L (ref 11–59)

## 2014-07-08 MED ORDER — ONDANSETRON HCL 4 MG/2ML IJ SOLN
4.0000 mg | Freq: Once | INTRAMUSCULAR | Status: AC
  Administered 2014-07-08: 4 mg via INTRAVENOUS
  Filled 2014-07-08: qty 2

## 2014-07-08 MED ORDER — HYDROMORPHONE HCL 1 MG/ML IJ SOLN
1.0000 mg | Freq: Once | INTRAMUSCULAR | Status: AC
  Administered 2014-07-08: 1 mg via INTRAVENOUS
  Filled 2014-07-08: qty 1

## 2014-07-08 MED ORDER — MORPHINE SULFATE 30 MG PO TABS: 30.0000 mg | ORAL_TABLET | ORAL | Status: DC | PRN

## 2014-07-08 MED ORDER — SODIUM CHLORIDE 0.9 % IV SOLN
Freq: Once | INTRAVENOUS | Status: AC
  Administered 2014-07-08: 13:00:00 via INTRAVENOUS

## 2014-07-08 MED ORDER — IOHEXOL 300 MG/ML  SOLN
100.0000 mL | Freq: Once | INTRAMUSCULAR | Status: AC | PRN
  Administered 2014-07-08: 100 mL via INTRAVENOUS

## 2014-07-08 MED ORDER — IOHEXOL 300 MG/ML  SOLN
50.0000 mL | Freq: Once | INTRAMUSCULAR | Status: AC | PRN
  Administered 2014-07-08: 50 mL via ORAL

## 2014-07-08 MED ORDER — MORPHINE SULFATE ER 60 MG PO TBCR: 120.0000 mg | EXTENDED_RELEASE_TABLET | Freq: Two times a day (BID) | ORAL | Status: DC
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
## 2014-07-08 NOTE — ED Notes (Signed)
Patient was educated not to drive, operate heavy machinery, or drink alcohol while taking narcotic medication.  

## 2014-07-08 NOTE — Discharge Instructions (Signed)
Acute Urinary Retention  Acute urinary retention is when you are unable to pee (urinate). Acute urinary retention is common in older men. Prostates can get bigger, which blocks the flow of pee.   HOME CARE  · Drink enough fluids to keep your pee clear or pale yellow.  · If you are sent home with a tube that drains the bladder (catheter), there will be a drainage bag attached to it. There are two types of bags. One is big that you can wear at night without having to empty it. One is smaller and needs to be emptied more often.  ¨ Keep the drainage bag empty.  ¨ Keep the drainage bag lower than your catheter.  · Only take medicine as told by your doctor.  GET HELP IF:  · You have a low-grade fever.  · You have spasms or you are leaking pee when you have spasms.  GET HELP RIGHT AWAY IF:   · You have chills or a fever.  · Your catheter stops draining pee.  · Your catheter falls out.  · You have increased bleeding that does not stop after you have rested and increased the amount of fluids you had been drinking.  MAKE SURE YOU:   · Understand these instructions.  · Will watch your condition.  · Will get help right away if you are not doing well or get worse.  Document Released: 09/28/2007 Document Revised: 01/30/2013 Document Reviewed: 09/20/2012  [employer]® Patient Information ©2015 [employer]. This information is not intended to replace advice given to you by your health care provider. Make sure you discuss any questions you have with your health care provider.
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
## 2014-07-08 NOTE — ED Notes (Signed)
MD at bedside. 

## 2014-07-08 NOTE — Progress Notes (Signed)
FYI, patient is on apixaban at home for hx of DVT and a dose is overdue. Consider ordering apixaban.   Call pharmacy with questions.  Romeo Rabon, PharmD, pager [phone]. 07/08/2014,1:11 PM.
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
## 2014-07-09 LAB — URINE CULTURE
CULTURE: NO GROWTH
Colony Count: NO GROWTH

## 2014-07-11 ENCOUNTER — Ambulatory Visit (HOSPITAL_COMMUNITY)
Admit: 2014-07-11 | Discharge: 2014-07-11 | Disposition: A | Discharge status: Home / Self Care | Payer: Self-pay | Attending: Interventional Radiology | Admitting: Interventional Radiology

## 2014-07-11 ENCOUNTER — Other Ambulatory Visit (HOSPITAL_COMMUNITY): Payer: Self-pay | Admitting: Urology

## 2014-07-11 DIAGNOSIS — S3720XA Unspecified injury of bladder, initial encounter: Secondary | ICD-10-CM

## 2014-07-11 DIAGNOSIS — Z436 Encounter for attention to other artificial openings of urinary tract: Secondary | ICD-10-CM | POA: Insufficient documentation

## 2014-07-11 NOTE — Procedures (Signed)
Successful fluoro guided bilateral PCN removal. No complications.

## 2014-07-25 ENCOUNTER — Encounter (HOSPITAL_COMMUNITY): Payer: Self-pay | Admitting: Emergency Medicine

## 2014-07-25 ENCOUNTER — Emergency Department (HOSPITAL_COMMUNITY): Payer: Self-pay

## 2014-07-25 ENCOUNTER — Emergency Department (HOSPITAL_COMMUNITY)
Admission: EM | Admit: 2014-07-25 | Discharge: 2014-07-25 | Disposition: A | Discharge status: Home / Self Care | Payer: Self-pay | Attending: Emergency Medicine | Admitting: Emergency Medicine

## 2014-07-25 DIAGNOSIS — Z720 Tobacco use: Secondary | ICD-10-CM | POA: Insufficient documentation

## 2014-07-25 DIAGNOSIS — Z86718 Personal history of other venous thrombosis and embolism: Secondary | ICD-10-CM | POA: Insufficient documentation

## 2014-07-25 DIAGNOSIS — R1084 Generalized abdominal pain: Secondary | ICD-10-CM | POA: Insufficient documentation

## 2014-07-25 DIAGNOSIS — I10 Essential (primary) hypertension: Secondary | ICD-10-CM | POA: Insufficient documentation

## 2014-07-25 DIAGNOSIS — R52 Pain, unspecified: Secondary | ICD-10-CM

## 2014-07-25 DIAGNOSIS — Z933 Colostomy status: Secondary | ICD-10-CM | POA: Insufficient documentation

## 2014-07-25 DIAGNOSIS — Z87828 Personal history of other (healed) physical injury and trauma: Secondary | ICD-10-CM | POA: Insufficient documentation

## 2014-07-25 DIAGNOSIS — R112 Nausea with vomiting, unspecified: Secondary | ICD-10-CM | POA: Insufficient documentation

## 2014-07-25 DIAGNOSIS — Z79899 Other long term (current) drug therapy: Secondary | ICD-10-CM | POA: Insufficient documentation

## 2014-07-25 DIAGNOSIS — J45909 Unspecified asthma, uncomplicated: Secondary | ICD-10-CM | POA: Insufficient documentation

## 2014-07-25 DIAGNOSIS — R1110 Vomiting, unspecified: Secondary | ICD-10-CM

## 2014-07-25 LAB — CBC WITH DIFFERENTIAL/PLATELET
BASOS PCT: 0 % (ref 0–1)
Basophils Absolute: 0 10*3/uL (ref 0.0–0.1)
EOS ABS: 0 10*3/uL (ref 0.0–0.7)
Eosinophils Relative: 0 % (ref 0–5)
HCT: 35.9 % — ABNORMAL LOW (ref 39.0–52.0)
Hemoglobin: 12.1 g/dL — ABNORMAL LOW (ref 13.0–17.0)
Lymphocytes Relative: 8 % — ABNORMAL LOW (ref 12–46)
Lymphs Abs: 1 10*3/uL (ref 0.7–4.0)
MCH: 24.4 pg — AB (ref 26.0–34.0)
MCHC: 33.7 g/dL (ref 30.0–36.0)
MCV: 72.5 fL — ABNORMAL LOW (ref 78.0–100.0)
MONO ABS: 0.3 10*3/uL (ref 0.1–1.0)
MONOS PCT: 3 % (ref 3–12)
NEUTROS PCT: 89 % — AB (ref 43–77)
Neutro Abs: 10.3 10*3/uL — ABNORMAL HIGH (ref 1.7–7.7)
Platelets: 309 10*3/uL (ref 150–400)
RBC: 4.95 MIL/uL (ref 4.22–5.81)
RDW: 19.4 % — ABNORMAL HIGH (ref 11.5–15.5)
WBC: 11.6 10*3/uL — ABNORMAL HIGH (ref 4.0–10.5)

## 2014-07-25 LAB — COMPREHENSIVE METABOLIC PANEL
ALT: 7 U/L (ref 0–53)
ANION GAP: 14 (ref 5–15)
AST: 19 U/L (ref 0–37)
Albumin: 4 g/dL (ref 3.5–5.2)
Alkaline Phosphatase: 76 U/L (ref 39–117)
BUN: 8 mg/dL (ref 6–23)
CHLORIDE: 108 mmol/L (ref 96–112)
CO2: 19 mmol/L (ref 19–32)
Calcium: 9 mg/dL (ref 8.4–10.5)
Creatinine, Ser: 0.86 mg/dL (ref 0.50–1.35)
GFR calc Af Amer: >90 mL/min (ref >90)
Glucose, Bld: 76 mg/dL (ref 70–99)
Potassium: 3.7 mmol/L (ref 3.5–5.1)
SODIUM: 141 mmol/L (ref 135–145)
Total Bilirubin: 0.8 mg/dL (ref 0.3–1.2)
Total Protein: 8 g/dL (ref 6.0–8.3)

## 2014-07-25 MED ORDER — ONDANSETRON HCL 4 MG PO TABS: 4.0000 mg | ORAL_TABLET | Freq: Four times a day (QID) | ORAL | Status: DC

## 2014-07-25 MED ORDER — METOCLOPRAMIDE HCL 5 MG/ML IJ SOLN
10.0000 mg | Freq: Once | INTRAMUSCULAR | Status: AC
  Administered 2014-07-25: 10 mg via INTRAVENOUS
  Filled 2014-07-25: qty 2

## 2014-07-25 MED ORDER — HYDROMORPHONE HCL 1 MG/ML IJ SOLN
1.0000 mg | Freq: Once | INTRAMUSCULAR | Status: AC
  Administered 2014-07-25: 1 mg via INTRAVENOUS
  Filled 2014-07-25: qty 1

## 2014-07-25 MED ORDER — SODIUM CHLORIDE 0.9 % IV BOLUS (SEPSIS)
1000.0000 mL | Freq: Once | INTRAVENOUS | Status: AC
  Administered 2014-07-25: 1000 mL via INTRAVENOUS

## 2014-07-25 MED ORDER — KETOROLAC TROMETHAMINE 30 MG/ML IJ SOLN
30.0000 mg | Freq: Once | INTRAMUSCULAR | Status: AC
  Administered 2014-07-25: 30 mg via INTRAVENOUS
  Filled 2014-07-25: qty 1

## 2014-07-25 MED ORDER — ONDANSETRON HCL 4 MG/2ML IJ SOLN
4.0000 mg | Freq: Once | INTRAMUSCULAR | Status: AC
  Administered 2014-07-25: 4 mg via INTRAVENOUS
  Filled 2014-07-25: qty 2

## 2014-07-25 MED ORDER — TRAMADOL HCL 50 MG PO TABS: 50.0000 mg | ORAL_TABLET | Freq: Four times a day (QID) | ORAL | Status: DC | PRN
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
## 2014-07-25 NOTE — Discharge Instructions (Signed)
Abdominal Pain Many things can cause abdominal pain. Usually, abdominal pain is not caused by a disease and will improve without treatment. It can often be observed and treated at home. Your health care provider will do a physical exam and possibly order blood tests and X-rays to help determine the seriousness of your pain. However, in many cases, more time must pass before a clear cause of the pain can be found. Before that point, your health care provider may not know if you need more testing or further treatment. HOME CARE INSTRUCTIONS  Monitor your abdominal pain for any changes. The following actions may help to alleviate any discomfort you are experiencing:  Only take over-the-counter or prescription medicines as directed by your health care provider.  Do not take laxatives unless directed to do so by your health care provider.  Try a clear liquid diet (broth, tea, or water) as directed by your health care provider. Slowly move to a bland diet as tolerated. SEEK MEDICAL CARE IF:  You have unexplained abdominal pain.  You have abdominal pain associated with nausea or diarrhea.  You have pain when you urinate or have a bowel movement.  You experience abdominal pain that wakes you in the night.  You have abdominal pain that is worsened or improved by eating food.  You have abdominal pain that is worsened with eating fatty foods.  You have a fever. SEEK IMMEDIATE MEDICAL CARE IF:   Your pain does not go away within 2 hours.  You keep throwing up (vomiting).  Your pain is felt only in portions of the abdomen, such as the right side or the left lower portion of the abdomen.  You pass bloody or black tarry stools. MAKE SURE YOU:  Understand these instructions.   Will watch your condition.   Will get help right away if you are not doing well or get worse.  Document Released: 01/19/2005 Document Revised: 04/16/2013 Document Reviewed: 12/19/2012 [facility] Patient Information  2015 [place], [place]. This information is not intended to replace advice given to you by your health care provider. Make sure you discuss any questions you have with your health care provider. You're given a antiemetics or medicine for nausea medicine for pain in the emergency department.  He was given IV fluids, your lab work was reviewed.  There is new sign of infection.  Your x-ray shows no bowel obstruction, your given a prescription for nausea medicine and pain control at home.  Please follow-up with your surgeon.  Primary care physician to refill your chronic pain medications     Emergency Department Resource Guide 1) Find a Doctor and Pay Out of Pocket Although you won't have to find out who is covered by your insurance plan, it is a good idea to ask around and get recommendations. You will then need to call the office and see if the doctor you have chosen will accept you as a new patient and what types of options they offer for patients who are self-pay. Some doctors offer discounts or will set up payment plans for their patients who do not have insurance, but you will need to ask so you aren't surprised when you get to your appointment.  2) Contact Your Local Health Department Not all health departments have doctors that can see patients for sick visits, but many do, so it is worth a call to see if yours does. If you don't know where your local health department is, you can check in your phone book.  The CDC also has a tool to help you locate your state's health department, and many state websites also have listings of all of their local health departments.  3) Find a [facility] If your illness is not likely to be very severe or complicated, you may want to try a walk in clinic. These are popping up all over the country in pharmacies, drugstores, and shopping centers. They're usually staffed by nurse practitioners or physician assistants that have been trained to treat common illnesses and  complaints. They're usually fairly quick and inexpensive. However, if you have serious medical issues or chronic medical problems, these are probably not your best option.  No Primary Care Doctor: - Call Health Connect at  [phone] - they can help you locate a primary care doctor that  accepts your insurance, provides certain services, etc. - Physician Referral Service- [phone]  Chronic Pain Problems: Organization         Address  Phone   Notes  [facility]  [phone] Patients need to be referred by their primary care doctor.   Medication Assistance: Organization         Address  Phone   Notes  [facility] Medication [facility] [address] [place], [place] [address] [phone] --Must be a resident of [facility] -- Must have NO insurance coverage whatsoever (no Medicaid/ Medicare, etc.) -- The pt. MUST have a primary care doctor that directs their care regularly and follows them in the community   MedAssist  [phone]   [employer]  [phone]    Agencies that provide inexpensive medical care: Organization         Address  Phone   Notes  [place]  [phone]   [facility]    [phone]   [facility] [place], [place] [address] [phone]   [place] [address], [place] [phone]   Planned Parenthood    ([phone]   [facility]    [phone]   [place] and [place] [address], [place] Phone:  [phone], Fax:  [phone] Hours of Operation:  9 am - 6 pm, M-F.  Also accepts Medicaid/Medicare and self-pay.  [facility] Advocate for [place], [address], [place] Phone: [phone], Fax: [phone]. Hours of Operation:  8:30 am - 5:30 pm, M-F.  Also accepts Medicaid and self-pay.  [facility] [address] [place] Phone: [phone]   [place], [place], [place] [phone], Ext. [phone] Mondays & Thursdays: 7-9 AM.  First 15 patients are seen on a first come, first serve basis.    [place] Providers:  Organization         Address  Phone   Notes  [facility] [address], [place] [phone] Also accepts self-pay patients.  [facility] [id number], [place]  [phone]   [place], [address], [place] [phone]   [facility] [address] [place] [phone]   Lucianne Lei [address], [place]   [phone] Only accepts [place] Access [place] patients after they have their name applied to their card.   Self-Pay (  no insurance) in [facility]:  Organization         Address  Phone   Notes  Sickle Cell Patients, [place] [phone]   [facility]gent Care [place] [phone]   [facility] [place]  [place], [place], [place] [phone]   [facility]/Dr. Osei-Bonsu  [address] [place] or [address], [place] [phone] Phone number for both [place] and [place] locations is the same.  Urgent Medical and [facility] [address], [place] [phone]   [facility] [address], [place] or [address] [phone]   [facility] [place] [address] [place] [phone], phone; [phone], fax Sees patients 1st and 3rd Saturday of every month.  Must not qualify for public or private insurance (i.e. Medicaid, Medicare, [place] Health Choice, Veterans' Benefits)  Household income should be no more than 200% of the poverty level  The clinic cannot treat you if you are pregnant or think you are pregnant  Sexually transmitted diseases are not treated at the clinic.    Dental Care: Organization         Address  Phone  Notes  [facility] Department of [facility] [place] [phone] Accepts children up to age 11 who are enrolled in [place] or [place]; pregnant women with a Medicaid card; and children who have applied for Medicaid or [place] Health Choice, but were declined, whose parents can pay a reduced fee at time of service.  [facility] Department of [facility]  [address], [place] [phone] Accepts children up to age 74 who are enrolled in [place] or [place]; pregnant women with a Medicaid card; and children who have applied for Medicaid or [place] Health Choice, but were declined, whose parents can pay a reduced fee at time of service.  [place] Adult Dental Access PROGRAM  [place] [phone] Patients are seen by appointment only. Walk-ins are not accepted. [place] will see patients 57 years of age and older. Monday - Tuesday (8am-5pm) Most Wednesdays (8:30-5pm) $30 per visit, cash only  [facility] Adult Dental Access PROGRAM  [address], [facility] [phone] Patients are seen by appointment only. Walk-ins are not accepted. [place] will see patients 1 years of age and older. One Wednesday Evening (Monthly: Volunteer Based).  $30 per visit, cash only  [place]  [phone] for adults; Children under age 68, call Graduate Pediatric Dentistry at [phone]. Children aged 72-14, please call [phone] to request a pediatric application.  Dental services are provided in all areas of dental care including fillings, crowns and bridges, complete and partial dentures, implants, gum treatment, root canals, and extractions. Preventive care is  also provided. Treatment is provided to both adults and children. Patients are selected via a lottery and there is often a waiting list.   [facility] [address] [place]  [phone] [email or web address]   [facility] [address] [place], [place] [phone], Ext. [phone] Second and Fourth Thursday of each month, opens at 6:30 AM; Clinic ends at 9 AM.  Patients are seen on a first-come first-served basis, and a limited number are  seen during each clinic.   [facility]  [address] Hillard Danker [place], [place] [phone]   Eligibility Requirements You must have lived in [place], [place], or [place] counties for at least the last three months.   You cannot be eligible for state or federal sponsored [employer], including [employer], [place], or [employer].   You generally cannot be eligible for healthcare insurance through your employer.    How to apply: Eligibility screenings are held every Tuesday and Wednesday afternoon from 1:00 pm until 4:00 pm. You do not need an appointment for the interview!  [facility] [address] [place], [place]   [place]  [place] Department  [place]  [phone]    Behavioral Health Resources in the Community: Intensive Outpatient Programs Organization         Address  Phone  Notes  [place]. [address] [place], [place] [phone]   [facility] Outpatient [address], [place], [place]   ADS: Alcohol & Drug Svcs [address], [employer], [place]   [place] [address],  [place], [place] or [phone]   Substance Abuse Resources Organization         Address  Phone  Notes  Alcohol and Drug Services  6506645899   [place]  [phone]   The [place]   Chinita Pester  [phone]   Residential & Outpatient Substance Abuse Program  [phone]   Psychological Services Organization         Address  Phone  Notes  [facility] [place]  [place]  [phone]   [place] [address], [place] or [phone]    Mobile Crisis Teams Organization         Address  Phone  Notes  [facility], Mobile Crisis Care Unit  [phone]   [facility]  [address] [place], [place]   [place] [address], [place] [phone]    Self-Help/Support Groups Organization         Address  Phone             Notes  [place]. of [place] - variety of support groups  [place] Call for more information  Narcotics Anonymous (NA), [facility] [address], [employer] [place]  2 meetings at this location   Special educational needs teacher         Address  Phone  Notes  [facility] [place],    [place]  [phone]   [facility]  [address], [place] [phone], [place], [place]   [place], [place] [phone] Admissions: 8am-3pm M-F  Incentives Substance [place] [address],    [place], [place] [phone]   [facility] [address] Jadene Pierini [place], [place]   [facility] [address],  [place], [place]   [facility] Programs - Intensive Outpatient [address] Krist[address], [place], [place]   [facility] ([place].) [place].,  [place], [place] or [phone]   [facility] Services (RTS) [address], [place], [place] Accepts Medicaid  [facility] [address]  [place] [phone]  Substance Abuse/Addiction Treatment   [facility] Organization         Address  Phone  Notes  [facility]  9130257982   Domenic Schwab, PhD [address] Arlis [place], [place]   [phone] or [phone]   [place] [address] [place], [place] [phone]   [address], [place], [place] [phone] Insurance/Medicaid/sponsorship through [facility] and Families [address]                                    [place], [place] [phone] [place] [address], [place] [phone]    Dr. Adele Schilder  [phone]   [facility] Dept. 1) [address]  2) [place] 3)  [place] [address], [place] [phone]  [phone]   [place] [phone] or [phone] (After Hours)

## 2014-07-25 NOTE — ED Notes (Signed)
Per EMS Patient called EMS to home for abdominal pain and cramping with nausea and vomiting. Pt states vomiting 5 times in past 25 hours.  134/72 HR 84 SpO2 98% CBG 83

## 2014-08-04 ENCOUNTER — Encounter (HOSPITAL_COMMUNITY): Payer: Self-pay | Admitting: *Deleted

## 2014-08-04 ENCOUNTER — Other Ambulatory Visit: Payer: Self-pay | Admitting: Urology

## 2014-08-04 ENCOUNTER — Emergency Department (HOSPITAL_COMMUNITY)
Admission: EM | Admit: 2014-08-04 | Discharge: 2014-08-04 | Disposition: A | Discharge status: Home / Self Care | Payer: Self-pay | Attending: Emergency Medicine | Admitting: Emergency Medicine

## 2014-08-04 ENCOUNTER — Emergency Department (HOSPITAL_COMMUNITY): Payer: Self-pay

## 2014-08-04 ENCOUNTER — Encounter (HOSPITAL_COMMUNITY): Payer: Self-pay

## 2014-08-04 DIAGNOSIS — J45909 Unspecified asthma, uncomplicated: Secondary | ICD-10-CM | POA: Insufficient documentation

## 2014-08-04 DIAGNOSIS — R112 Nausea with vomiting, unspecified: Secondary | ICD-10-CM | POA: Insufficient documentation

## 2014-08-04 DIAGNOSIS — N390 Urinary tract infection, site not specified: Secondary | ICD-10-CM | POA: Insufficient documentation

## 2014-08-04 DIAGNOSIS — Z87828 Personal history of other (healed) physical injury and trauma: Secondary | ICD-10-CM | POA: Insufficient documentation

## 2014-08-04 DIAGNOSIS — I10 Essential (primary) hypertension: Secondary | ICD-10-CM | POA: Insufficient documentation

## 2014-08-04 DIAGNOSIS — Z79899 Other long term (current) drug therapy: Secondary | ICD-10-CM | POA: Insufficient documentation

## 2014-08-04 DIAGNOSIS — Z86718 Personal history of other venous thrombosis and embolism: Secondary | ICD-10-CM | POA: Insufficient documentation

## 2014-08-04 DIAGNOSIS — Z720 Tobacco use: Secondary | ICD-10-CM | POA: Insufficient documentation

## 2014-08-04 LAB — CBC WITH DIFFERENTIAL/PLATELET
Basophils Absolute: 0 10*3/uL (ref 0.0–0.1)
Basophils Relative: 0 % (ref 0–1)
EOS ABS: 0 10*3/uL (ref 0.0–0.7)
Eosinophils Relative: 0 % (ref 0–5)
HCT: 37.4 % — ABNORMAL LOW (ref 39.0–52.0)
Hemoglobin: 12.9 g/dL — ABNORMAL LOW (ref 13.0–17.0)
LYMPHS ABS: 1.4 10*3/uL (ref 0.7–4.0)
Lymphocytes Relative: 14 % (ref 12–46)
MCH: 25 pg — ABNORMAL LOW (ref 26.0–34.0)
MCHC: 34.5 g/dL (ref 30.0–36.0)
MCV: 72.6 fL — ABNORMAL LOW (ref 78.0–100.0)
Monocytes Absolute: 0.6 10*3/uL (ref 0.1–1.0)
Monocytes Relative: 6 % (ref 3–12)
NEUTROS PCT: 79 % — AB (ref 43–77)
Neutro Abs: 7.7 10*3/uL (ref 1.7–7.7)
PLATELETS: 306 10*3/uL (ref 150–400)
RBC: 5.15 MIL/uL (ref 4.22–5.81)
RDW: 19.4 % — ABNORMAL HIGH (ref 11.5–15.5)
WBC: 9.7 10*3/uL (ref 4.0–10.5)

## 2014-08-04 LAB — URINALYSIS, ROUTINE W REFLEX MICROSCOPIC
Bilirubin Urine: NEGATIVE
Glucose, UA: NEGATIVE mg/dL
Ketones, ur: NEGATIVE mg/dL
NITRITE: NEGATIVE
Protein, ur: NEGATIVE mg/dL
Specific Gravity, Urine: 1.005 (ref 1.005–1.030)
UROBILINOGEN UA: 0.2 mg/dL (ref 0.0–1.0)
pH: 7 (ref 5.0–8.0)

## 2014-08-04 LAB — URINE MICROSCOPIC-ADD ON

## 2014-08-04 LAB — COMPREHENSIVE METABOLIC PANEL
ALT: <5 U/L (ref 0–53)
ANION GAP: 10 (ref 5–15)
AST: 18 U/L (ref 0–37)
Albumin: 3.6 g/dL (ref 3.5–5.2)
Alkaline Phosphatase: 83 U/L (ref 39–117)
BUN: 5 mg/dL — AB (ref 6–23)
CALCIUM: 9.3 mg/dL (ref 8.4–10.5)
CO2: 24 mmol/L (ref 19–32)
Chloride: 102 mmol/L (ref 96–112)
Creatinine, Ser: 0.79 mg/dL (ref 0.50–1.35)
GFR calc non Af Amer: >90 mL/min (ref >90)
Glucose, Bld: 103 mg/dL — ABNORMAL HIGH (ref 70–99)
Potassium: 4.2 mmol/L (ref 3.5–5.1)
SODIUM: 136 mmol/L (ref 135–145)
TOTAL PROTEIN: 7.1 g/dL (ref 6.0–8.3)
Total Bilirubin: 0.5 mg/dL (ref 0.3–1.2)

## 2014-08-04 LAB — LIPASE, BLOOD: LIPASE: 11 U/L (ref 11–59)

## 2014-08-04 MED ORDER — HYDROMORPHONE HCL 1 MG/ML IJ SOLN
1.0000 mg | Freq: Once | INTRAMUSCULAR | Status: DC
  Filled 2014-08-04: qty 1

## 2014-08-04 MED ORDER — METOCLOPRAMIDE HCL 5 MG/ML IJ SOLN
10.0000 mg | Freq: Once | INTRAMUSCULAR | Status: AC
  Administered 2014-08-04: 10 mg via INTRAVENOUS
  Filled 2014-08-04: qty 2

## 2014-08-04 MED ORDER — SODIUM CHLORIDE 0.9 % IV SOLN
1000.0000 mL | Freq: Once | INTRAVENOUS | Status: AC
  Administered 2014-08-04: 1000 mL via INTRAVENOUS

## 2014-08-04 MED ORDER — FLUCONAZOLE 100 MG PO TABS: 100.0000 mg | ORAL_TABLET | Freq: Every day | ORAL | Status: DC

## 2014-08-04 MED ORDER — CEPHALEXIN 250 MG PO CAPS
500.0000 mg | ORAL_CAPSULE | Freq: Once | ORAL | Status: AC
  Administered 2014-08-04: 500 mg via ORAL
  Filled 2014-08-04: qty 2

## 2014-08-04 MED ORDER — OXYCODONE-ACETAMINOPHEN 5-325 MG PO TABS
2.0000 | ORAL_TABLET | Freq: Once | ORAL | Status: AC
  Administered 2014-08-04: 2 via ORAL
  Filled 2014-08-04: qty 2

## 2014-08-04 MED ORDER — CEPHALEXIN 500 MG PO CAPS: 500.0000 mg | ORAL_CAPSULE | Freq: Three times a day (TID) | ORAL | Status: DC

## 2014-08-04 MED ORDER — HYDROMORPHONE HCL 1 MG/ML IJ SOLN
1.0000 mg | Freq: Once | INTRAMUSCULAR | Status: AC
  Administered 2014-08-04: 1 mg via INTRAVENOUS
  Filled 2014-08-04: qty 1

## 2014-08-04 MED ORDER — DEXTROSE 5 % IV SOLN
1.0000 g | Freq: Once | INTRAVENOUS | Status: DC
  Filled 2014-08-04: qty 10

## 2014-08-04 MED ORDER — IOHEXOL 300 MG/ML  SOLN
25.0000 mL | Freq: Once | INTRAMUSCULAR | Status: AC | PRN
  Administered 2014-08-04: 25 mL via ORAL

## 2014-08-04 MED ORDER — SODIUM CHLORIDE 0.9 % IV SOLN: 1000.0000 mL | INTRAVENOUS | Status: DC

## 2014-08-04 MED ORDER — ONDANSETRON 8 MG PO TBDP: 8.0000 mg | ORAL_TABLET | Freq: Three times a day (TID) | ORAL | Status: DC | PRN

## 2014-08-04 MED ORDER — IOHEXOL 300 MG/ML  SOLN
80.0000 mL | Freq: Once | INTRAMUSCULAR | Status: AC | PRN
  Administered 2014-08-04: 80 mL via INTRAVENOUS

## 2014-08-04 MED ORDER — FLUCONAZOLE 100 MG PO TABS
200.0000 mg | ORAL_TABLET | Freq: Once | ORAL | Status: AC
  Administered 2014-08-04: 200 mg via ORAL
  Filled 2014-08-04: qty 2

## 2014-08-04 MED ORDER — DOCUSATE SODIUM 50 MG PO CAPS: 50.0000 mg | ORAL_CAPSULE | Freq: Two times a day (BID) | ORAL | Status: DC
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
## 2014-08-04 NOTE — ED Notes (Signed)
Pt given new colostomy pouch and clean wet rags per his request. Pt able to change bag himself.

## 2014-08-04 NOTE — ED Notes (Signed)
Pt stated his pain was a 7 out of 10.  He understood discharge instructions.  He was alert and orientated X 4 upon discharge.

## 2014-08-04 NOTE — ED Notes (Signed)
Pt reports he has a tube in his kidney that is causing him pain. He also reports emesis and being unable to keep down food or liquids.

## 2014-08-04 NOTE — ED Notes (Signed)
Attempted to restart IV, unsuccessful. Will have second RN try.

## 2014-08-04 NOTE — ED Notes (Signed)
Pt requesting to have IV removed due to pain, this RN explained to pt that IV was still patent and working and if removed pt may have to have IV placed again if meds/procedures ordered. Pt refusing to have IV stay in hand, this RN removed IV.

## 2014-08-04 NOTE — Progress Notes (Signed)
Please put orders in Epic for Same day surgery 08-05-14 Thanks

## 2014-08-05 ENCOUNTER — Encounter (HOSPITAL_COMMUNITY)
Admission: RE | Disposition: A | Discharge status: Home / Self Care | Payer: Self-pay | Source: Ambulatory Visit | Attending: Urology

## 2014-08-05 ENCOUNTER — Ambulatory Visit (HOSPITAL_COMMUNITY)
Admission: RE | Admit: 2014-08-05 | Discharge: 2014-08-05 | Disposition: A | Discharge status: Home / Self Care | Payer: Self-pay | Source: Ambulatory Visit | Attending: Urology | Admitting: Urology

## 2014-08-05 ENCOUNTER — Encounter (HOSPITAL_COMMUNITY): Payer: Self-pay | Admitting: *Deleted

## 2014-08-05 ENCOUNTER — Ambulatory Visit (HOSPITAL_COMMUNITY): Payer: MEDICAID | Admitting: Anesthesiology

## 2014-08-05 ENCOUNTER — Ambulatory Visit (HOSPITAL_COMMUNITY): Payer: Self-pay | Admitting: Anesthesiology

## 2014-08-05 ENCOUNTER — Ambulatory Visit (HOSPITAL_COMMUNITY): Payer: Self-pay

## 2014-08-05 DIAGNOSIS — F17210 Nicotine dependence, cigarettes, uncomplicated: Secondary | ICD-10-CM | POA: Insufficient documentation

## 2014-08-05 DIAGNOSIS — J45909 Unspecified asthma, uncomplicated: Secondary | ICD-10-CM | POA: Insufficient documentation

## 2014-08-05 DIAGNOSIS — W3400XA Accidental discharge from unspecified firearms or gun, initial encounter: Secondary | ICD-10-CM

## 2014-08-05 DIAGNOSIS — I10 Essential (primary) hypertension: Secondary | ICD-10-CM | POA: Insufficient documentation

## 2014-08-05 DIAGNOSIS — W3400XD Accidental discharge from unspecified firearms or gun, subsequent encounter: Secondary | ICD-10-CM

## 2014-08-05 DIAGNOSIS — N1330 Unspecified hydronephrosis: Secondary | ICD-10-CM | POA: Insufficient documentation

## 2014-08-05 DIAGNOSIS — Z86718 Personal history of other venous thrombosis and embolism: Secondary | ICD-10-CM | POA: Insufficient documentation

## 2014-08-05 DIAGNOSIS — I739 Peripheral vascular disease, unspecified: Secondary | ICD-10-CM | POA: Insufficient documentation

## 2014-08-05 DIAGNOSIS — D649 Anemia, unspecified: Secondary | ICD-10-CM | POA: Insufficient documentation

## 2014-08-05 HISTORY — PX: CYSTOSCOPY W/ URETERAL STENT PLACEMENT: SHX1429

## 2014-08-05 HISTORY — PX: CYSTOGRAM: SHX6285

## 2014-08-05 LAB — SURGICAL PCR SCREEN
MRSA, PCR: POSITIVE — AB
Staphylococcus aureus: POSITIVE — AB

## 2014-08-05 SURGERY — CYSTOSCOPY, WITH RETROGRADE PYELOGRAM AND URETERAL STENT INSERTION
Anesthesia: General | Laterality: Left

## 2014-08-05 MED ORDER — GENTAMICIN SULFATE 40 MG/ML IJ SOLN
340.0000 mg | INTRAVENOUS | Status: AC
  Administered 2014-08-05: 340 mg via INTRAVENOUS
  Filled 2014-08-05: qty 8.5

## 2014-08-05 MED ORDER — FENTANYL CITRATE 0.05 MG/ML IJ SOLN
INTRAMUSCULAR | Status: DC | PRN
  Administered 2014-08-05 (×3): 50 ug via INTRAVENOUS

## 2014-08-05 MED ORDER — DIATRIZOATE MEGLUMINE 30 % UR SOLN
URETHRAL | Status: DC | PRN
  Administered 2014-08-05: 600 mL via URETHRAL

## 2014-08-05 MED ORDER — LACTATED RINGERS IV SOLN
INTRAVENOUS | Status: DC
  Administered 2014-08-05 (×2): 1000 mL via INTRAVENOUS

## 2014-08-05 MED ORDER — LIDOCAINE HCL (CARDIAC) 20 MG/ML IV SOLN
INTRAVENOUS | Status: AC
  Filled 2014-08-05: qty 5

## 2014-08-05 MED ORDER — HYDROMORPHONE HCL 1 MG/ML IJ SOLN: 0.2500 mg | INTRAMUSCULAR | Status: DC | PRN

## 2014-08-05 MED ORDER — FENTANYL CITRATE 0.05 MG/ML IJ SOLN
INTRAMUSCULAR | Status: AC
  Filled 2014-08-05: qty 2

## 2014-08-05 MED ORDER — ONDANSETRON HCL 4 MG/2ML IJ SOLN
INTRAMUSCULAR | Status: DC | PRN
  Administered 2014-08-05: 4 mg via INTRAVENOUS

## 2014-08-05 MED ORDER — GENTAMICIN IN SALINE 1.6-0.9 MG/ML-% IV SOLN: 80.0000 mg | INTRAVENOUS | Status: DC

## 2014-08-05 MED ORDER — HYDROMORPHONE HCL 2 MG/ML IJ SOLN
INTRAMUSCULAR | Status: AC
  Filled 2014-08-05: qty 1

## 2014-08-05 MED ORDER — ONDANSETRON HCL 4 MG/2ML IJ SOLN
INTRAMUSCULAR | Status: AC
  Filled 2014-08-05: qty 2

## 2014-08-05 MED ORDER — MIDAZOLAM HCL 2 MG/2ML IJ SOLN
INTRAMUSCULAR | Status: AC
  Filled 2014-08-05: qty 2

## 2014-08-05 MED ORDER — MUPIROCIN 2 % EX OINT
1.0000 "application " | TOPICAL_OINTMENT | Freq: Once | CUTANEOUS | Status: AC
  Administered 2014-08-05: 1 via TOPICAL
  Filled 2014-08-05: qty 22

## 2014-08-05 MED ORDER — LIDOCAINE HCL (CARDIAC) 20 MG/ML IV SOLN
INTRAVENOUS | Status: DC | PRN
  Administered 2014-08-05: 100 mg via INTRAVENOUS

## 2014-08-05 MED ORDER — MIDAZOLAM HCL 5 MG/5ML IJ SOLN
INTRAMUSCULAR | Status: DC | PRN
  Administered 2014-08-05: 2 mg via INTRAVENOUS

## 2014-08-05 MED ORDER — SODIUM CHLORIDE 0.9 % IR SOLN
Status: DC | PRN
  Administered 2014-08-05: 3000 mL via INTRAVESICAL

## 2014-08-05 MED ORDER — PROPOFOL 10 MG/ML IV BOLUS
INTRAVENOUS | Status: DC | PRN
  Administered 2014-08-05: 200 mg via INTRAVENOUS

## 2014-08-05 MED ORDER — HYDROMORPHONE HCL 1 MG/ML IJ SOLN
INTRAMUSCULAR | Status: DC | PRN
  Administered 2014-08-05: 0.5 mg via INTRAVENOUS
  Administered 2014-08-05: 1 mg via INTRAVENOUS
  Administered 2014-08-05: 0.5 mg via INTRAVENOUS

## 2014-08-05 MED ORDER — PROPOFOL 10 MG/ML IV BOLUS
INTRAVENOUS | Status: AC
  Filled 2014-08-05: qty 20

## 2014-08-05 MED ORDER — FLUCONAZOLE IN SODIUM CHLORIDE 200-0.9 MG/100ML-% IV SOLN
200.0000 mg | Freq: Once | INTRAVENOUS | Status: AC
  Administered 2014-08-05: 200 mg via INTRAVENOUS
  Filled 2014-08-05: qty 100

## 2014-08-05 SURGICAL SUPPLY — 16 items
BAG URO CATCHER STRL LF (DRAPE) ×3 IMPLANT
BASKET ZERO TIP NITINOL 2.4FR (BASKET) IMPLANT
BSKT STON RTRVL ZERO TP 2.4FR (BASKET)
CATH INTERMIT  6FR 70CM (CATHETERS) ×4 IMPLANT
CLOTH BEACON ORANGE TIMEOUT ST (SAFETY) ×3 IMPLANT
GLOVE BIOGEL M STRL SZ7.5 (GLOVE) ×3 IMPLANT
GOWN STRL REUS W/TWL XL LVL3 (GOWN DISPOSABLE) ×3 IMPLANT
GUIDEWIRE ANG ZIPWIRE 038X150 (WIRE) IMPLANT
GUIDEWIRE STR DUAL SENSOR (WIRE) ×4 IMPLANT
KIT BASIN OR (CUSTOM PROCEDURE TRAY) ×2 IMPLANT
PACK CYSTO (CUSTOM PROCEDURE TRAY) ×3 IMPLANT
STENT CONTOUR 6FRX24X.038 (STENTS) ×2 IMPLANT
SYR 20CC LL (SYRINGE) ×3 IMPLANT
TUBING CONNECTING 10 (TUBING) ×2 IMPLANT
TUBING CONNECTING 10' (TUBING) ×1
WIRE COONS/BENSON .038X145CM (WIRE) ×3 IMPLANT

## 2014-08-05 NOTE — Anesthesia Preprocedure Evaluation (Signed)
Anesthesia Evaluation  Patient identified by MRN, date of birth, ID band Patient awake    Reviewed: Allergy & Precautions, NPO status , Patient's Chart, lab work & pertinent test results  Airway Mallampati: II  TM Distance: >3 FB Neck ROM: Full    Dental no notable dental hx.    Pulmonary asthma , Current Smoker,  breath sounds clear to auscultation  Pulmonary exam normal       Cardiovascular hypertension, + Peripheral Vascular Disease Rhythm:Regular Rate:Normal     Neuro/Psych negative neurological ROS  negative psych ROS   GI/Hepatic negative GI ROS, Neg liver ROS,   Endo/Other  negative endocrine ROS  Renal/GU Renal disease  negative genitourinary   Musculoskeletal negative musculoskeletal ROS (+)   Abdominal   Peds negative pediatric ROS (+)  Hematology  (+) anemia ,   Anesthesia Other Findings   Reproductive/Obstetrics negative OB ROS                             Anesthesia Physical Anesthesia Plan  ASA: II  Anesthesia Plan: General   Post-op Pain Management:    Induction: Intravenous  Airway Management Planned: LMA  Additional Equipment:   Intra-op Plan:   Post-operative Plan: Extubation in OR  Informed Consent: I have reviewed the patients History and Physical, chart, labs and discussed the procedure including the risks, benefits and alternatives for the proposed anesthesia with the patient or authorized representative who has indicated his/her understanding and acceptance.   Dental advisory given  Plan Discussed with: CRNA  Anesthesia Plan Comments:         Anesthesia Quick Evaluation

## 2014-08-05 NOTE — Transfer of Care (Signed)
Immediate Anesthesia Transfer of Care Note  Patient: Carlos Lawrence  Procedure(s) Performed: Procedure(s) (LRB): CYSTOSCOPY WITH RETROGRADE LEFT STENT CHANGE  AND CYSTOGRAM (Left) CYSTOGRAM (Left)  Patient Location: PACU  Anesthesia Type: General  Level of Consciousness: sedated, patient cooperative and responds to stimulation  Airway & Oxygen Therapy: Patient Spontanous Breathing and Patient connected to face mask oxgen  Post-op Assessment: Report given to PACU RN and Post -op Vital signs reviewed and stable  Post vital signs: Reviewed and stable  Complications: No apparent anesthesia complications
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
## 2014-08-05 NOTE — Addendum Note (Signed)
Addendum  created 08/05/14 1321 by Montel Clock, CRNA   Modules edited: Anesthesia Attestations   Anesthesia Attestations:  Attestation Deleted

## 2014-08-05 NOTE — Anesthesia Procedure Notes (Signed)
Procedure Name: LMA Insertion Date/Time: 08/05/2014 11:04 AM Performed by: Glory Buff Pre-anesthesia Checklist: Patient identified, Emergency Drugs available, Suction available, Patient being monitored and Timeout performed Patient Re-evaluated:Patient Re-evaluated prior to inductionOxygen Delivery Method: Circle system utilized Preoxygenation: Pre-oxygenation with 100% oxygen Intubation Type: IV induction Ventilation: Mask ventilation without difficulty LMA: LMA inserted LMA Size: 4.0 Number of attempts: 1 Placement Confirmation: positive ETCO2 and breath sounds checked- equal and bilateral Tube secured with: Tape

## 2014-08-05 NOTE — Anesthesia Postprocedure Evaluation (Signed)
  Anesthesia Post-op Note  Patient: Carlos Lawrence  Procedure(s) Performed: Procedure(s) (LRB): CYSTOSCOPY WITH RETROGRADE LEFT STENT CHANGE  AND CYSTOGRAM (Left) CYSTOGRAM (Left)  Patient Location: PACU  Anesthesia Type: General  Level of Consciousness: awake and alert   Airway and Oxygen Therapy: Patient Spontanous Breathing  Post-op Pain: mild  Post-op Assessment: Post-op Vital signs reviewed, Patient's Cardiovascular Status Stable, Respiratory Function Stable, Patent Airway and No signs of Nausea or vomiting  Last Vitals:  Filed Vitals:   08/05/14 1230  BP: 155/104  Pulse: 83  Temp:   Resp: 12    Post-op Vital Signs: stable   Complications: No apparent anesthesia complications

## 2014-08-05 NOTE — Interval H&P Note (Signed)
History and Physical Interval Note:  08/05/2014 10:39 AM  Carlos Lawrence  has presented today for surgery, with the diagnosis of LEFT FLANK PAIN  The various methods of treatment have been discussed with the patient and family. After consideration of risks, benefits and other options for treatment, the patient has consented to  Procedure(s): CYSTOSCOPY WITH RETROGRADE LEFT STENT CHANGE  AND CYSTOGRAM (Left) CYSTOGRAM (Left) as a surgical intervention .  The patient's history has been reviewed, patient examined, no change in status, stable for surgery.  I have reviewed the patient's chart and labs.  Questions were answered to the patient's satisfaction.     [name] A

## 2014-08-05 NOTE — Discharge Instructions (Signed)
I have reviewed discharge instructions in detail with the patient. They will follow-up with me or their physician as scheduled. My nurse will also be calling the patients as per protocol.   

## 2014-08-06 ENCOUNTER — Encounter (HOSPITAL_COMMUNITY): Payer: Self-pay | Admitting: Urology

## 2014-08-06 LAB — URINE CULTURE
Colony Count: 30000
Colony Count: NO GROWTH
Culture: NO GROWTH

## 2014-08-26 ENCOUNTER — Emergency Department (HOSPITAL_COMMUNITY)
Admission: EM | Admit: 2014-08-26 | Discharge: 2014-08-26 | Disposition: A | Discharge status: Home / Self Care | Payer: Self-pay | Attending: Emergency Medicine | Admitting: Emergency Medicine

## 2014-08-26 ENCOUNTER — Emergency Department (HOSPITAL_COMMUNITY): Payer: Self-pay

## 2014-08-26 ENCOUNTER — Encounter (HOSPITAL_COMMUNITY): Payer: Self-pay | Admitting: *Deleted

## 2014-08-26 DIAGNOSIS — T148 Other injury of unspecified body region: Secondary | ICD-10-CM | POA: Insufficient documentation

## 2014-08-26 DIAGNOSIS — I10 Essential (primary) hypertension: Secondary | ICD-10-CM | POA: Insufficient documentation

## 2014-08-26 DIAGNOSIS — J45909 Unspecified asthma, uncomplicated: Secondary | ICD-10-CM | POA: Insufficient documentation

## 2014-08-26 DIAGNOSIS — S79911A Unspecified injury of right hip, initial encounter: Secondary | ICD-10-CM | POA: Insufficient documentation

## 2014-08-26 DIAGNOSIS — W19XXXA Unspecified fall, initial encounter: Secondary | ICD-10-CM

## 2014-08-26 DIAGNOSIS — S301XXA Contusion of abdominal wall, initial encounter: Secondary | ICD-10-CM

## 2014-08-26 DIAGNOSIS — W06XXXA Fall from bed, initial encounter: Secondary | ICD-10-CM | POA: Insufficient documentation

## 2014-08-26 DIAGNOSIS — Z720 Tobacco use: Secondary | ICD-10-CM | POA: Insufficient documentation

## 2014-08-26 DIAGNOSIS — Y92003 Bedroom of unspecified non-institutional (private) residence as the place of occurrence of the external cause: Secondary | ICD-10-CM | POA: Insufficient documentation

## 2014-08-26 DIAGNOSIS — Z792 Long term (current) use of antibiotics: Secondary | ICD-10-CM | POA: Insufficient documentation

## 2014-08-26 DIAGNOSIS — S032XXA Dislocation of tooth, initial encounter: Secondary | ICD-10-CM | POA: Insufficient documentation

## 2014-08-26 DIAGNOSIS — T148XXA Other injury of unspecified body region, initial encounter: Secondary | ICD-10-CM

## 2014-08-26 DIAGNOSIS — Y9389 Activity, other specified: Secondary | ICD-10-CM | POA: Insufficient documentation

## 2014-08-26 DIAGNOSIS — Y998 Other external cause status: Secondary | ICD-10-CM | POA: Insufficient documentation

## 2014-08-26 DIAGNOSIS — Z79899 Other long term (current) drug therapy: Secondary | ICD-10-CM | POA: Insufficient documentation

## 2014-08-26 DIAGNOSIS — Z86718 Personal history of other venous thrombosis and embolism: Secondary | ICD-10-CM | POA: Insufficient documentation

## 2014-08-26 MED ORDER — OXYCODONE-ACETAMINOPHEN 5-325 MG PO TABS
2.0000 | ORAL_TABLET | Freq: Once | ORAL | Status: AC
  Administered 2014-08-26: 2 via ORAL
  Filled 2014-08-26: qty 2

## 2014-08-26 MED ORDER — IBUPROFEN 200 MG PO TABS
600.0000 mg | ORAL_TABLET | Freq: Once | ORAL | Status: AC
  Administered 2014-08-26: 600 mg via ORAL
  Filled 2014-08-26: qty 3

## 2014-08-26 MED ORDER — IBUPROFEN 600 MG PO TABS: 600.0000 mg | ORAL_TABLET | Freq: Four times a day (QID) | ORAL | Status: DC | PRN

## 2014-08-26 NOTE — Discharge Instructions (Signed)
We saw you in the ER after you had a fall. All the imaging results are normal, no fractures seen. No evidence of brain bleed. Please be very careful with walking, and do everything possible to prevent falls.  Take motrin in addition to the pain meds you take.   Contusion A contusion is a deep bruise. Contusions are the result of an injury that caused bleeding under the skin. The contusion may turn blue, purple, or yellow. Minor injuries will give you a painless contusion, but more severe contusions may stay painful and swollen for a few weeks.  CAUSES  A contusion is usually caused by a blow, trauma, or direct force to an area of the body. SYMPTOMS   Swelling and redness of the injured area.  Bruising of the injured area.  Tenderness and soreness of the injured area.  Pain. DIAGNOSIS  The diagnosis can be made by taking a history and physical exam. An X-ray, CT scan, or MRI may be needed to determine if there were any associated injuries, such as fractures. TREATMENT  Specific treatment will depend on what area of the body was injured. In general, the best treatment for a contusion is resting, icing, elevating, and applying cold compresses to the injured area. Over-the-counter medicines may also be recommended for pain control. Ask your caregiver what the best treatment is for your contusion. HOME CARE INSTRUCTIONS   Put ice on the injured area.  Put ice in a plastic bag.  Place a towel between your skin and the bag.  Leave the ice on for 15-20 minutes, 3-4 times a day, or as directed by your health care provider.  Only take over-the-counter or prescription medicines for pain, discomfort, or fever as directed by your caregiver. Your caregiver may recommend avoiding anti-inflammatory medicines (aspirin, ibuprofen, and naproxen) for 48 hours because these medicines may increase bruising.  Rest the injured area.  If possible, elevate the injured area to reduce swelling. SEEK  IMMEDIATE MEDICAL CARE IF:   You have increased bruising or swelling.  You have pain that is getting worse.  Your swelling or pain is not relieved with medicines. MAKE SURE YOU:   Understand these instructions.  Will watch your condition.  Will get help right away if you are not doing well or get worse. Document Released: 01/19/2005 Document Revised: 04/16/2013 Document Reviewed: 02/14/2011 [facility] Patient Information 2015 [place], [place]. This information is not intended to replace advice given to you by your health care provider. Make sure you discuss any questions you have with your health care provider.   Tooth Loss Another word for a tooth getting knocked out is avulsion. When a tooth is knocked out, treatment includes controlling bleeding and pain. Permanent teeth may be successfully reimplanted in some cases. The sooner the tooth is cleaned and replaced in the proper position in the socket, the better the chance it can be saved. Evaluation by a dentist as soon as possible is needed so that the tooth may be positioned and stabilized. A temporary splint may be used to hold the tooth in place for the first 3 to 4 weeks. A splint joins the weak tooth to a strong tooth to increase the strength of the weak tooth. Baby teeth that are knocked out do not need to be reimplanted. Reimplantation can be helped with emergency care if the entire tooth has been knocked out. This type of repair can be done only if the tooth can be reinserted within 2 hours of the accident.  It is best if it can be done within a few minutes of the accident. A dentist should be seen as soon as possible. After 2 hours, the chances of saving the tooth are minimal. However, dental referral can be beneficial. Your dentist will discuss your options with you.  STEPS TO TAKE IF YOU LOSE A TOOTH  Do not handle the tooth by the root.  Wash the tooth off in clean water but not under a tap. Do not scrub the tooth.  You may try  to replace the tooth in the socket. Gently bite down on it to get it in place.  If trying to replace the tooth does not work, keep the tooth in a glass of milk or water. You may keep it in your own mouth if there is no danger of swallowing it. However, this is not recommended for children. HOME CARE INSTRUCTIONS   You can use ice packs along your jaw to help control swelling and pain.  For the next few days, eat a liquid or soft diet and rinse your mouth out with warm water after meals.  Watch for signs of infection.  You should take all medications for pain and antibiotics as prescribed by your dentist.  An avulsed tooth will require stabilization for 1 to 2 months to ensure proper healing. This means it should not move around. SEEK MEDICAL CARE IF:  Pain is becoming worse or uncontrollable with medication.  You have increased swelling in your face or around the reimplanted tooth.  You have an oral temperature above 102F (38.9C) not controlled by medication.  You cannot open your mouth. Document Released: 01/04/2001 Document Revised: 08/26/2013 Document Reviewed: 08/10/2009 [facility] Patient Information 2015 [place], [place]. This information is not intended to replace advice given to you by your health care provider. Make sure you discuss any questions you have with your health care provider.
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
## 2014-08-26 NOTE — ED Notes (Signed)
Also c/o right hip pain.
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
## 2014-09-23 ENCOUNTER — Other Ambulatory Visit: Payer: Self-pay | Admitting: General Surgery

## 2014-09-23 DIAGNOSIS — Z933 Colostomy status: Secondary | ICD-10-CM

## 2014-09-29 ENCOUNTER — Other Ambulatory Visit: Payer: Self-pay

## 2014-09-29 ENCOUNTER — Ambulatory Visit
Admission: RE | Admit: 2014-09-29 | Discharge: 2014-09-29 | Disposition: A | Discharge status: Home / Self Care | Payer: No Typology Code available for payment source | Source: Ambulatory Visit | Attending: General Surgery | Admitting: General Surgery

## 2014-10-07 ENCOUNTER — Ambulatory Visit: Payer: Self-pay | Admitting: General Surgery

## 2014-10-29 ENCOUNTER — Encounter (HOSPITAL_COMMUNITY): Payer: Self-pay

## 2014-10-29 ENCOUNTER — Encounter (HOSPITAL_COMMUNITY)
Admission: RE | Admit: 2014-10-29 | Discharge: 2014-10-29 | Disposition: A | Discharge status: Home / Self Care | Payer: MEDICAID | Source: Ambulatory Visit | Attending: General Surgery | Admitting: General Surgery

## 2014-10-29 DIAGNOSIS — Z433 Encounter for attention to colostomy: Secondary | ICD-10-CM | POA: Insufficient documentation

## 2014-10-29 DIAGNOSIS — Z01812 Encounter for preprocedural laboratory examination: Secondary | ICD-10-CM | POA: Insufficient documentation

## 2014-10-29 HISTORY — DX: Unspecified osteoarthritis, unspecified site: M19.90

## 2014-10-29 LAB — CBC WITH DIFFERENTIAL/PLATELET
Basophils Absolute: 0 10*3/uL (ref 0.0–0.1)
Basophils Relative: 0 % (ref 0–1)
EOS PCT: 1 % (ref 0–5)
Eosinophils Absolute: 0.1 10*3/uL (ref 0.0–0.7)
HCT: 36.1 % — ABNORMAL LOW (ref 39.0–52.0)
HEMOGLOBIN: 12.5 g/dL — AB (ref 13.0–17.0)
LYMPHS ABS: 1 10*3/uL (ref 0.7–4.0)
LYMPHS PCT: 18 % (ref 12–46)
MCH: 26 pg (ref 26.0–34.0)
MCHC: 34.6 g/dL (ref 30.0–36.0)
MCV: 75.2 fL — ABNORMAL LOW (ref 78.0–100.0)
MONOS PCT: 7 % (ref 3–12)
Monocytes Absolute: 0.3 10*3/uL (ref 0.1–1.0)
NEUTROS PCT: 74 % (ref 43–77)
Neutro Abs: 3.8 10*3/uL (ref 1.7–7.7)
PLATELETS: 297 10*3/uL (ref 150–400)
RBC: 4.8 MIL/uL (ref 4.22–5.81)
RDW: 15.5 % (ref 11.5–15.5)
WBC: 5.2 10*3/uL (ref 4.0–10.5)

## 2014-10-29 LAB — BASIC METABOLIC PANEL
ANION GAP: 8 (ref 5–15)
BUN: 9 mg/dL (ref 6–20)
CO2: 25 mmol/L (ref 22–32)
CREATININE: 1.16 mg/dL (ref 0.61–1.24)
Calcium: 9.1 mg/dL (ref 8.9–10.3)
Chloride: 103 mmol/L (ref 101–111)
GFR calc non Af Amer: >60 mL/min (ref >60)
Glucose, Bld: 83 mg/dL (ref 65–99)
POTASSIUM: 4 mmol/L (ref 3.5–5.1)
SODIUM: 136 mmol/L (ref 135–145)

## 2014-10-29 NOTE — Progress Notes (Addendum)
Office called re: ? bowel prep needed. Fleets ordered (but patient unaware) Spoke with wendy. She will talk with dr wyatt and call patient if needed.

## 2014-10-29 NOTE — Pre-Procedure Instructions (Addendum)
Carlos Lawrence  10/29/2014      [id number] [place], [place] - [place] [address] Phone: [phone] Fax: [phone]  [facility] [address] [place], [place] - [address] [place] [address] Phone: [phone] Fax: [phone]  [id number] [place], [place] - [address] [place] [address] Phone: [phone] Fax: [place], [place] [address] [place] [address] Phone: [phone] Fax: [phone]    Your procedure is scheduled on 11/06/14  Report to [facility] short stay admitting at  600 A.M.  Call this number if you have problems the morning of surgery:  [phone]   Remember:  Do not eat food or drink liquids after midnight.  Take these medicines the morning of surgery with A SIP OF WATER norvasc, pain if needed, inhaler (bring to hosp)   STOP all herbel meds, nsaids (aleve,naproxen,advil,ibuprofen) 5 days prior to surgery starting  11/01/14 including aspirin, vitamins   Do not wear jewelry, make-up or nail polish.  Do not wear lotions, powders, or perfumes.  You may wear deodorant.  Do not shave 48 hours prior to surgery.  Men may shave face and neck.  Do not bring valuables to the hospital.  [facility] is not responsible for any belongings or valuables.  Contacts, dentures or bridgework may not be worn into surgery.  Leave your suitcase in the car.  After surgery it may be brought to your room.  For patients admitted to the hospital, discharge time will be determined by your treatment team.  Patients discharged the day of surgery will not be allowed to drive home.   Name and phone number of your driver:    Special instructions:   Special Instructions:  - Preparing for Surgery  Before surgery, you can play an important role.   Because skin is not sterile, your skin needs to be as free of germs as possible.  You can reduce the number of germs on you skin by washing with CHG (chlorahexidine gluconate) soap before surgery.  CHG is an antiseptic cleaner which kills germs and bonds with the skin to continue killing germs even after washing.  Please DO NOT use if you have an allergy to CHG or antibacterial soaps.  If your skin becomes reddened/irritated stop using the CHG and inform your nurse when you arrive at Short Stay.  Do not shave (including legs and underarms) for at least 48 hours prior to the first CHG shower.  You may shave your face.  Please follow these instructions carefully:   1.  Shower with CHG Soap the night before surgery and the morning of Surgery.  2.  If you choose to wash your hair, wash your hair first as usual with your normal shampoo.  3.  After you shampoo, rinse your hair and body thoroughly to remove the Shampoo.  4.  Use CHG as you would any other liquid soap.  You can apply chg directly  to the skin and wash gently with scrungie or a clean washcloth.  5.  Apply the CHG Soap to your body ONLY FROM THE NECK DOWN.  Do not use on open wounds or open sores.  Avoid contact with your eyes ears, mouth and genitals (private parts).  Wash genitals (private parts)       with  your normal soap.  6.  Wash thoroughly, paying special attention to the area where your surgery will be performed.  7.  Thoroughly rinse your body with warm water from the neck down.  8.  DO NOT shower/wash with your normal soap after using and rinsing off the CHG Soap.  9.  Pat yourself dry with a clean towel.            10.  Wear clean pajamas.            11.  Place clean sheets on your bed the night of your first shower and do not sleep with pets.  Day of Surgery  Do not apply any lotions/deodorants the morning of surgery.  Please wear clean clothes to the hospital/surgery center.  Please read over the following fact sheets  that you were given. Pain Booklet, Coughing and Deep Breathing and Surgical Site Infection Prevention

## 2014-11-05 MED ORDER — DEXTROSE 5 % IV SOLN
2.0000 g | INTRAVENOUS | Status: DC
  Filled 2014-11-05: qty 2

## 2014-11-05 MED ORDER — FLEET ENEMA 7-19 GM/118ML RE ENEM: 1.0000 | ENEMA | RECTAL | Status: DC
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
## 2014-11-06 ENCOUNTER — Encounter (HOSPITAL_COMMUNITY): Payer: Self-pay | Admitting: General Practice

## 2014-11-06 ENCOUNTER — Ambulatory Visit (HOSPITAL_COMMUNITY)
Admission: RE | Admit: 2014-11-06 | Discharge: 2014-11-06 | Disposition: A | Discharge status: Home / Self Care | Payer: Self-pay | Source: Ambulatory Visit | Attending: General Surgery | Admitting: General Surgery

## 2014-11-06 ENCOUNTER — Encounter (HOSPITAL_COMMUNITY)
Admission: RE | Disposition: A | Discharge status: Home / Self Care | Payer: Self-pay | Source: Ambulatory Visit | Attending: General Surgery

## 2014-11-06 DIAGNOSIS — Z933 Colostomy status: Secondary | ICD-10-CM | POA: Insufficient documentation

## 2014-11-06 DIAGNOSIS — Z7902 Long term (current) use of antithrombotics/antiplatelets: Secondary | ICD-10-CM | POA: Insufficient documentation

## 2014-11-06 DIAGNOSIS — Z79899 Other long term (current) drug therapy: Secondary | ICD-10-CM | POA: Insufficient documentation

## 2014-11-06 SURGERY — COLOSTOMY REVERSAL
Anesthesia: General

## 2014-11-06 MED ORDER — ALVIMOPAN 12 MG PO CAPS: 12.0000 mg | ORAL_CAPSULE | Freq: Once | ORAL | Status: DC

## 2014-11-06 MED ORDER — CHLORHEXIDINE GLUCONATE CLOTH 2 % EX PADS: 6.0000 | MEDICATED_PAD | Freq: Once | CUTANEOUS | Status: DC

## 2014-11-06 NOTE — Progress Notes (Signed)
Paged to Dr. Hulen Skains to inform him that patient did not do the enema due to not having instructions.

## 2014-11-06 NOTE — Progress Notes (Signed)
Informed Dr. Hulen Skains that pt did not do his enema.  He stated that if the patient did not do his bowel prep and antibiotics his surgery would need to be rescheduled.  Pt stated he did not have instructions to do the bowel prep. In speaking with patient and his Celesta Gentile', his Celesta Gentile' [facility] instead of Dr. Richarda Blade office.  The patient stated that he was suppose to go to "Dr. Richarda Blade office, the [place]".  I explained to them that the Surgery cnter and Dr. Richarda Blade office were two separate buildings. Based on the patient saying "the [facility]" instead of [facility], his Celesta Gentile' went to the Surgery center.  There they informed him that they did not have anything to give to him.

## 2014-11-06 NOTE — Progress Notes (Signed)
Patient's blood pressure is 166/114.  Patient states that he has not taken his Norvasc in a couple of months.  Dr. Hulen Skains made aware.

## 2014-11-14 ENCOUNTER — Encounter (HOSPITAL_COMMUNITY): Payer: Self-pay | Admitting: *Deleted

## 2014-11-14 NOTE — Progress Notes (Signed)
Carlos Lawrence stated that his prep is antibiotics to start on Sunday at 7:00AM, an enema on Sunday PM and Monday AM before arrive to hospital.  Patient reported that he does not have the medication today, but he is picking it up today.

## 2014-11-17 ENCOUNTER — Encounter (HOSPITAL_COMMUNITY): Admission: RE | Disposition: A | Discharge status: Home / Self Care | Payer: Self-pay | Source: Ambulatory Visit

## 2014-11-17 ENCOUNTER — Inpatient Hospital Stay (HOSPITAL_COMMUNITY): Payer: Self-pay | Admitting: Anesthesiology

## 2014-11-17 ENCOUNTER — Inpatient Hospital Stay (HOSPITAL_COMMUNITY)
Admission: RE | Admit: 2014-11-17 | Discharge: 2014-11-21 | DRG: 355 | Disposition: A | Discharge status: Home / Self Care | Payer: Self-pay | Source: Ambulatory Visit | Attending: General Surgery | Admitting: General Surgery

## 2014-11-17 ENCOUNTER — Inpatient Hospital Stay (HOSPITAL_COMMUNITY): Payer: MEDICAID | Admitting: Anesthesiology

## 2014-11-17 ENCOUNTER — Encounter (HOSPITAL_COMMUNITY): Payer: Self-pay | Admitting: Anesthesiology

## 2014-11-17 DIAGNOSIS — Z8249 Family history of ischemic heart disease and other diseases of the circulatory system: Secondary | ICD-10-CM

## 2014-11-17 DIAGNOSIS — J45909 Unspecified asthma, uncomplicated: Secondary | ICD-10-CM | POA: Diagnosis present

## 2014-11-17 DIAGNOSIS — Z933 Colostomy status: Secondary | ICD-10-CM

## 2014-11-17 DIAGNOSIS — F17210 Nicotine dependence, cigarettes, uncomplicated: Secondary | ICD-10-CM | POA: Diagnosis present

## 2014-11-17 DIAGNOSIS — K660 Peritoneal adhesions (postprocedural) (postinfection): Secondary | ICD-10-CM | POA: Diagnosis present

## 2014-11-17 DIAGNOSIS — I10 Essential (primary) hypertension: Secondary | ICD-10-CM | POA: Diagnosis present

## 2014-11-17 DIAGNOSIS — Z7901 Long term (current) use of anticoagulants: Secondary | ICD-10-CM

## 2014-11-17 DIAGNOSIS — Z433 Encounter for attention to colostomy: Principal | ICD-10-CM

## 2014-11-17 DIAGNOSIS — D72829 Elevated white blood cell count, unspecified: Secondary | ICD-10-CM | POA: Diagnosis present

## 2014-11-17 DIAGNOSIS — Z79899 Other long term (current) drug therapy: Secondary | ICD-10-CM

## 2014-11-17 HISTORY — DX: Personal history of urinary calculi: Z87.442

## 2014-11-17 HISTORY — PX: COLOSTOMY REVERSAL: SHX5782

## 2014-11-17 LAB — BASIC METABOLIC PANEL
Anion gap: 7 (ref 5–15)
BUN: 12 mg/dL (ref 6–20)
CALCIUM: 8.7 mg/dL — AB (ref 8.9–10.3)
CHLORIDE: 105 mmol/L (ref 101–111)
CO2: 23 mmol/L (ref 22–32)
Creatinine, Ser: 0.98 mg/dL (ref 0.61–1.24)
GFR calc Af Amer: >60 mL/min (ref >60)
GFR calc non Af Amer: >60 mL/min (ref >60)
GLUCOSE: 82 mg/dL (ref 65–99)
POTASSIUM: 3.7 mmol/L (ref 3.5–5.1)
SODIUM: 135 mmol/L (ref 135–145)

## 2014-11-17 LAB — CBC
HCT: 34.4 % — ABNORMAL LOW (ref 39.0–52.0)
HEMOGLOBIN: 12.1 g/dL — AB (ref 13.0–17.0)
MCH: 26.7 pg (ref 26.0–34.0)
MCHC: 35.2 g/dL (ref 30.0–36.0)
MCV: 75.9 fL — AB (ref 78.0–100.0)
PLATELETS: 265 10*3/uL (ref 150–400)
RBC: 4.53 MIL/uL (ref 4.22–5.81)
RDW: 15.2 % (ref 11.5–15.5)
WBC: 5.4 10*3/uL (ref 4.0–10.5)

## 2014-11-17 LAB — SURGICAL PCR SCREEN
MRSA, PCR: POSITIVE — AB
Staphylococcus aureus: POSITIVE — AB

## 2014-11-17 SURGERY — COLOSTOMY REVERSAL
Anesthesia: General | Site: Abdomen

## 2014-11-17 MED ORDER — HYDROMORPHONE HCL 1 MG/ML IJ SOLN
0.2500 mg | INTRAMUSCULAR | Status: DC | PRN
  Administered 2014-11-17 (×2): 1 mg via INTRAVENOUS
  Administered 2014-11-17: 2 mg via INTRAVENOUS
  Administered 2014-11-17: 1 mg via INTRAVENOUS

## 2014-11-17 MED ORDER — MEPERIDINE HCL 25 MG/ML IJ SOLN: 6.2500 mg | INTRAMUSCULAR | Status: DC | PRN

## 2014-11-17 MED ORDER — HYDROMORPHONE HCL 1 MG/ML IJ SOLN
INTRAMUSCULAR | Status: AC
  Filled 2014-11-17: qty 2

## 2014-11-17 MED ORDER — ROCURONIUM BROMIDE 50 MG/5ML IV SOLN
INTRAVENOUS | Status: AC
  Filled 2014-11-17: qty 1

## 2014-11-17 MED ORDER — ONDANSETRON HCL 4 MG/2ML IJ SOLN
4.0000 mg | Freq: Four times a day (QID) | INTRAMUSCULAR | Status: DC | PRN
  Administered 2014-11-17 – 2014-11-20 (×6): 4 mg via INTRAVENOUS
  Filled 2014-11-17 (×6): qty 2

## 2014-11-17 MED ORDER — HYDROMORPHONE HCL 1 MG/ML IJ SOLN
1.0000 mg | INTRAMUSCULAR | Status: DC | PRN
  Administered 2014-11-17: 1 mg via INTRAVENOUS
  Administered 2014-11-17 (×2): 2 mg via INTRAVENOUS
  Administered 2014-11-17 (×2): 1 mg via INTRAVENOUS
  Administered 2014-11-18 – 2014-11-19 (×15): 2 mg via INTRAVENOUS
  Filled 2014-11-17 (×3): qty 2
  Filled 2014-11-17: qty 1
  Filled 2014-11-17 (×7): qty 2
  Filled 2014-11-17: qty 1
  Filled 2014-11-17 (×3): qty 2
  Filled 2014-11-17: qty 1
  Filled 2014-11-17: qty 2
  Filled 2014-11-17: qty 1
  Filled 2014-11-17 (×3): qty 2

## 2014-11-17 MED ORDER — FENTANYL CITRATE (PF) 250 MCG/5ML IJ SOLN
INTRAMUSCULAR | Status: AC
  Filled 2014-11-17: qty 5

## 2014-11-17 MED ORDER — MIDAZOLAM HCL 2 MG/2ML IJ SOLN
INTRAMUSCULAR | Status: AC
  Filled 2014-11-17: qty 2

## 2014-11-17 MED ORDER — LACTATED RINGERS IV SOLN
INTRAVENOUS | Status: DC | PRN
  Administered 2014-11-17 (×3): via INTRAVENOUS

## 2014-11-17 MED ORDER — GLYCOPYRROLATE 0.2 MG/ML IJ SOLN
INTRAMUSCULAR | Status: DC | PRN
  Administered 2014-11-17: .7 mg via INTRAVENOUS
  Administered 2014-11-17: 0.2 mg via INTRAVENOUS

## 2014-11-17 MED ORDER — HYDRALAZINE HCL 20 MG/ML IJ SOLN
INTRAMUSCULAR | Status: AC
  Filled 2014-11-17: qty 1

## 2014-11-17 MED ORDER — ALBUTEROL SULFATE (2.5 MG/3ML) 0.083% IN NEBU: 2.5000 mg | INHALATION_SOLUTION | Freq: Four times a day (QID) | RESPIRATORY_TRACT | Status: DC | PRN

## 2014-11-17 MED ORDER — ZOLPIDEM TARTRATE 5 MG PO TABS
5.0000 mg | ORAL_TABLET | Freq: Every evening | ORAL | Status: DC | PRN
  Administered 2014-11-19: 5 mg via ORAL
  Filled 2014-11-17 (×2): qty 1

## 2014-11-17 MED ORDER — KCL IN DEXTROSE-NACL 20-5-0.45 MEQ/L-%-% IV SOLN
INTRAVENOUS | Status: DC
Start: 2014-11-17 — End: 2014-11-20
  Administered 2014-11-17 – 2014-11-19 (×5): via INTRAVENOUS
  Filled 2014-11-17 (×6): qty 1000

## 2014-11-17 MED ORDER — AMLODIPINE BESYLATE 10 MG PO TABS
10.0000 mg | ORAL_TABLET | Freq: Every day | ORAL | Status: DC
  Administered 2014-11-17 – 2014-11-21 (×5): 10 mg via ORAL
  Filled 2014-11-17 (×5): qty 1

## 2014-11-17 MED ORDER — MIDAZOLAM HCL 5 MG/5ML IJ SOLN
INTRAMUSCULAR | Status: DC | PRN
  Administered 2014-11-17 (×2): 1 mg via INTRAVENOUS

## 2014-11-17 MED ORDER — POVIDONE-IODINE 10 % EX OINT
TOPICAL_OINTMENT | CUTANEOUS | Status: AC
  Filled 2014-11-17: qty 28.35

## 2014-11-17 MED ORDER — ENOXAPARIN SODIUM 40 MG/0.4ML ~~LOC~~ SOLN
40.0000 mg | SUBCUTANEOUS | Status: DC
  Administered 2014-11-18 – 2014-11-21 (×4): 40 mg via SUBCUTANEOUS
  Filled 2014-11-17 (×4): qty 0.4

## 2014-11-17 MED ORDER — POVIDONE-IODINE 10 % EX OINT
TOPICAL_OINTMENT | CUTANEOUS | Status: DC | PRN
  Administered 2014-11-17: 1 via TOPICAL

## 2014-11-17 MED ORDER — FENTANYL CITRATE (PF) 100 MCG/2ML IJ SOLN
INTRAMUSCULAR | Status: DC | PRN
  Administered 2014-11-17 (×2): 50 ug via INTRAVENOUS
  Administered 2014-11-17: 150 ug via INTRAVENOUS
  Administered 2014-11-17 (×4): 50 ug via INTRAVENOUS
  Administered 2014-11-17: 250 ug via INTRAVENOUS
  Administered 2014-11-17: 50 ug via INTRAVENOUS

## 2014-11-17 MED ORDER — PROPOFOL 10 MG/ML IV BOLUS
INTRAVENOUS | Status: AC
  Filled 2014-11-17: qty 20

## 2014-11-17 MED ORDER — ONDANSETRON HCL 4 MG/2ML IJ SOLN
INTRAMUSCULAR | Status: DC | PRN
  Administered 2014-11-17: 4 mg via INTRAVENOUS

## 2014-11-17 MED ORDER — ONDANSETRON HCL 4 MG/2ML IJ SOLN
INTRAMUSCULAR | Status: AC
  Filled 2014-11-17: qty 2

## 2014-11-17 MED ORDER — ROCURONIUM BROMIDE 100 MG/10ML IV SOLN
INTRAVENOUS | Status: DC | PRN
  Administered 2014-11-17: 15 mg via INTRAVENOUS
  Administered 2014-11-17: 5 mg via INTRAVENOUS
  Administered 2014-11-17: 10 mg via INTRAVENOUS
  Administered 2014-11-17: 50 mg via INTRAVENOUS

## 2014-11-17 MED ORDER — OXYCODONE-ACETAMINOPHEN 5-325 MG PO TABS
ORAL_TABLET | ORAL | Status: AC
  Filled 2014-11-17: qty 2

## 2014-11-17 MED ORDER — MUPIROCIN 2 % EX OINT
1.0000 "application " | TOPICAL_OINTMENT | Freq: Once | CUTANEOUS | Status: AC
  Administered 2014-11-17: 1 via TOPICAL
  Filled 2014-11-17: qty 22

## 2014-11-17 MED ORDER — HYDROMORPHONE HCL 1 MG/ML IJ SOLN: 0.2500 mg | INTRAMUSCULAR | Status: DC | PRN

## 2014-11-17 MED ORDER — LABETALOL HCL 5 MG/ML IV SOLN
INTRAVENOUS | Status: DC | PRN
  Administered 2014-11-17 (×4): 5 mg via INTRAVENOUS

## 2014-11-17 MED ORDER — ALVIMOPAN 12 MG PO CAPS
12.0000 mg | ORAL_CAPSULE | Freq: Once | ORAL | Status: AC
  Administered 2014-11-17: 12 mg via ORAL
  Filled 2014-11-17: qty 1

## 2014-11-17 MED ORDER — PROPOFOL 10 MG/ML IV BOLUS
INTRAVENOUS | Status: DC | PRN
  Administered 2014-11-17: 200 mg via INTRAVENOUS

## 2014-11-17 MED ORDER — OXYCODONE-ACETAMINOPHEN 5-325 MG PO TABS
1.0000 | ORAL_TABLET | ORAL | Status: DC | PRN
  Administered 2014-11-17: 2 via ORAL
  Filled 2014-11-17: qty 2

## 2014-11-17 MED ORDER — 0.9 % SODIUM CHLORIDE (POUR BTL) OPTIME
TOPICAL | Status: DC | PRN
  Administered 2014-11-17: 1000 mL

## 2014-11-17 MED ORDER — HYDROMORPHONE HCL 1 MG/ML IJ SOLN
INTRAMUSCULAR | Status: AC
  Filled 2014-11-17: qty 1

## 2014-11-17 MED ORDER — PROMETHAZINE HCL 25 MG/ML IJ SOLN: 6.2500 mg | INTRAMUSCULAR | Status: DC | PRN

## 2014-11-17 MED ORDER — ALVIMOPAN 12 MG PO CAPS
12.0000 mg | ORAL_CAPSULE | Freq: Two times a day (BID) | ORAL | Status: DC
  Administered 2014-11-18 – 2014-11-21 (×7): 12 mg via ORAL
  Filled 2014-11-17 (×7): qty 1

## 2014-11-17 MED ORDER — LIDOCAINE HCL (CARDIAC) 20 MG/ML IV SOLN
INTRAVENOUS | Status: DC | PRN
  Administered 2014-11-17: 30 mg via INTRAVENOUS

## 2014-11-17 MED ORDER — ONDANSETRON HCL 4 MG PO TABS: 4.0000 mg | ORAL_TABLET | Freq: Four times a day (QID) | ORAL | Status: DC | PRN

## 2014-11-17 MED ORDER — MIDAZOLAM HCL 2 MG/2ML IJ SOLN
0.5000 mg | Freq: Once | INTRAMUSCULAR | Status: AC | PRN
  Administered 2014-11-17: 2 mg via INTRAVENOUS

## 2014-11-17 MED ORDER — DEXTROSE 5 % IV SOLN
2.0000 g | Freq: Two times a day (BID) | INTRAVENOUS | Status: AC
  Administered 2014-11-17: 2 g via INTRAVENOUS
  Filled 2014-11-17: qty 2

## 2014-11-17 MED ORDER — DEXTROSE 5 % IV SOLN
2.0000 g | INTRAVENOUS | Status: AC
  Administered 2014-11-17: 2 g via INTRAVENOUS
  Filled 2014-11-17: qty 2

## 2014-11-17 MED ORDER — NEOSTIGMINE METHYLSULFATE 10 MG/10ML IV SOLN
INTRAVENOUS | Status: DC | PRN
  Administered 2014-11-17: 5 mg via INTRAVENOUS

## 2014-11-17 MED ORDER — DEXAMETHASONE SODIUM PHOSPHATE 4 MG/ML IJ SOLN
INTRAMUSCULAR | Status: AC
  Filled 2014-11-17: qty 2

## 2014-11-17 MED ORDER — LIDOCAINE HCL (CARDIAC) 20 MG/ML IV SOLN
INTRAVENOUS | Status: AC
  Filled 2014-11-17: qty 5

## 2014-11-17 SURGICAL SUPPLY — 64 items
BLADE SURG ROTATE 9660 (MISCELLANEOUS) ×1 IMPLANT
CANISTER SUCTION 2500CC (MISCELLANEOUS) ×2 IMPLANT
CHLORAPREP W/TINT 26ML (MISCELLANEOUS) ×2 IMPLANT
COVER MAYO STAND STRL (DRAPES) ×4 IMPLANT
COVER SURGICAL LIGHT HANDLE (MISCELLANEOUS) ×3 IMPLANT
DRAIN PENROSE 1/2X12 LTX STRL (WOUND CARE) ×1 IMPLANT
DRAPE LAPAROSCOPIC ABDOMINAL (DRAPES) ×2 IMPLANT
DRAPE PROXIMA HALF (DRAPES) ×3 IMPLANT
DRAPE UTILITY XL STRL (DRAPES) ×6 IMPLANT
DRAPE WARM FLUID 44X44 (DRAPE) ×2 IMPLANT
DRSG OPSITE POSTOP 4X10 (GAUZE/BANDAGES/DRESSINGS) ×2 IMPLANT
DRSG OPSITE POSTOP 4X6 (GAUZE/BANDAGES/DRESSINGS) ×2 IMPLANT
DRSG OPSITE POSTOP 4X8 (GAUZE/BANDAGES/DRESSINGS) IMPLANT
ELECT BLADE 6.5 EXT (BLADE) ×2 IMPLANT
ELECT CAUTERY BLADE 6.4 (BLADE) ×4 IMPLANT
ELECT REM PT RETURN 9FT ADLT (ELECTROSURGICAL) ×2
ELECTRODE REM PT RTRN 9FT ADLT (ELECTROSURGICAL) ×1 IMPLANT
GAUZE SPONGE 4X4 12PLY STRL (GAUZE/BANDAGES/DRESSINGS) ×1 IMPLANT
GLOVE BIO SURGEON STRL SZ7.5 (GLOVE) ×2 IMPLANT
GLOVE BIOGEL PI IND STRL 6 (GLOVE) IMPLANT
GLOVE BIOGEL PI IND STRL 7.0 (GLOVE) IMPLANT
GLOVE BIOGEL PI IND STRL 7.5 (GLOVE) IMPLANT
GLOVE BIOGEL PI IND STRL 8 (GLOVE) ×2 IMPLANT
GLOVE BIOGEL PI INDICATOR 6 (GLOVE) ×1
GLOVE BIOGEL PI INDICATOR 7.0 (GLOVE) ×2
GLOVE BIOGEL PI INDICATOR 7.5 (GLOVE) ×2
GLOVE BIOGEL PI INDICATOR 8 (GLOVE) ×2
GLOVE ECLIPSE 6.5 STRL STRAW (GLOVE) ×1 IMPLANT
GLOVE ECLIPSE 7.5 STRL STRAW (GLOVE) ×4 IMPLANT
GLOVE SURG SS PI 7.0 STRL IVOR (GLOVE) ×1 IMPLANT
GLOVE SURG SS PI 7.5 STRL IVOR (GLOVE) ×2 IMPLANT
GOWN STRL REUS W/ TWL LRG LVL3 (GOWN DISPOSABLE) ×6 IMPLANT
GOWN STRL REUS W/TWL LRG LVL3 (GOWN DISPOSABLE) ×14
KIT BASIN OR (CUSTOM PROCEDURE TRAY) ×2 IMPLANT
KIT ROOM TURNOVER OR (KITS) ×2 IMPLANT
LEGGING LITHOTOMY PAIR STRL (DRAPES) ×1 IMPLANT
LIGASURE IMPACT 36 18CM CVD LR (INSTRUMENTS) ×1 IMPLANT
NS IRRIG 1000ML POUR BTL (IV SOLUTION) ×4 IMPLANT
PACK GENERAL/GYN (CUSTOM PROCEDURE TRAY) ×2 IMPLANT
PAD ARMBOARD 7.5X6 YLW CONV (MISCELLANEOUS) ×2 IMPLANT
PENCIL BUTTON HOLSTER BLD 10FT (ELECTRODE) ×2 IMPLANT
SPECIMEN JAR MEDIUM (MISCELLANEOUS) ×1 IMPLANT
SPONGE LAP 18X18 X RAY DECT (DISPOSABLE) ×2 IMPLANT
STAPLER CUT CVD 40MM BLUE (STAPLE) ×1 IMPLANT
STAPLER GUN LINEAR PROX 60 (STAPLE) ×1 IMPLANT
STAPLER PROXIMATE 75MM BLUE (STAPLE) ×1 IMPLANT
STAPLER VISISTAT 35W (STAPLE) ×2 IMPLANT
SUCTION POOLE TIP (SUCTIONS) ×2 IMPLANT
SURGILUBE 2OZ TUBE FLIPTOP (MISCELLANEOUS) ×1 IMPLANT
SUT NOVA NAB DX-16 0-1 5-0 T12 (SUTURE) ×2 IMPLANT
SUT PDS AB 1 TP1 96 (SUTURE) ×4 IMPLANT
SUT PROLENE 2 0 CT2 30 (SUTURE) ×1 IMPLANT
SUT SILK 2 0 SH (SUTURE) ×1 IMPLANT
SUT SILK 2 0 SH CR/8 (SUTURE) ×3 IMPLANT
SUT SILK 2 0 TIES 10X30 (SUTURE) ×2 IMPLANT
SUT SILK 3 0 SH CR/8 (SUTURE) ×2 IMPLANT
SUT SILK 3 0 TIES 10X30 (SUTURE) ×2 IMPLANT
SUT VIC AB 1 CTX 18 (SUTURE) ×1 IMPLANT
SYR BULB IRRIGATION 50ML (SYRINGE) ×2 IMPLANT
TOWEL OR 17X26 10 PK STRL BLUE (TOWEL DISPOSABLE) ×4 IMPLANT
TRAY FOLEY CATH 14FRSI W/METER (CATHETERS) ×1 IMPLANT
TRAY PROCTOSCOPIC FIBER OPTIC (SET/KITS/TRAYS/PACK) ×1 IMPLANT
TUBE CONNECTING 12X1/4 (SUCTIONS) ×2 IMPLANT
YANKAUER SUCT BULB TIP NO VENT (SUCTIONS) ×2 IMPLANT

## 2014-11-17 NOTE — Op Note (Signed)
OPERATIVE REPORT  DATE OF OPERATION: 11/17/2014  PATIENT:  Carlos Lawrence  29 y.o. male  PRE-OPERATIVE DIAGNOSIS:  colostomy in place  POST-OPERATIVE DIAGNOSIS:  colostomy in place  PROCEDURE:  Procedure(s): COLOSTOMY REVERSAL  SURGEON:  Surgeon(s): Judeth Horn, MD  ASSISTANTDeon Pilling, RNFA  ANESTHESIA:   general  EBL: 50 ml  BLOOD ADMINISTERED: none  DRAINS: Penrose drain in the ostomy site and Urinary Catheter (Foley)   SPECIMEN:  Source of Specimen:  Resected colostomy  COUNTS CORRECT:  YES  PROCEDURE DETAILS: The patient was taken to the operating room and placed on the table in the supine position. A timeout was performed identifying the patient and procedure to be performed. His colostomy was in the left upper quadrant was sutured closed prior to prepping the patient using a running 2-0 silk suture.  The patient was placed initially in the supine position and subsequently in the lithotomy position and prepped and draped in usual sterile manner.  A midline incision was made excising the previously placed widen incision from his multiple previous laparotomies. We dissected down into the peritoneal cavity where there were omental adhesions to the anterior abdominal wall. These adhesions were taken down using blunt and sharp dissection with electrocautery. Care was taken not to injure any small bowel or large bowel. There were no inadvertent enterotomies during the case.  We were able to dissect down into the pelvis where a Balfour retractor was placed. The sigmoid colon and distal rectum were identified and subsequently dissected free for the subsequent anastomosis.  The colostomy was taken down using a 15 blade and subsequently electrocautery to dissect it free from the surrounding tissues. We subsequently mobilized well enough in order to reach into the pelvis where the anastomosis was to be placed.  Because I was concerned about the tortuosity of the distal sigmoid  colon and proximal rectum in antegrade premium EEA anastomosis was performed through a colotomy placed in the descending colon. On a proper conditions and with proper exposure the distal sigmoid colon was open and a sizer up to a 29 mm was placed. We subsequently used the anvil of a 29 mm EEA stapler and placed in the distal: Secured in place with a running pursestring suture of 2-0 proline.  With a sprain retractor occluding the proximal descending colon, an enterotomy was made transversely in the descending colon through which the stapling devices passed. We subsequently open up the trocar through the stapled end of the descending colon and subsequently attached to the anvil in the distal sigmoid colon. The stapling device was closed and fired performing the anastomosis. The anastomosis was tested for leak through a sigmoidoscope passed to the rectum and a pelvis full of saline and no leak noted. The colotomy proximal to the anastomosis was closed using a TA 60 stapler. There is a small leaked air which is sutured using interrupted 3-0 silk sutures.  We subsequently change of gown and glove then irrigated with saline. The colostomy site was closed using interrupted figure-of-eight stitches of #1 Vicryls. We subsequently closed the abdomen using running looped #1 PDS suture with interrupted internal figure-of-eight stitches of #1 Novafil. The skin was closed using stainless steel staples. The colostomy site was closed loosely over a Penrose drain in the deep torsion of the wound with stainless steel staples.  All needle counts, sponge counts, and instrument counts were correct.  PATIENT DISPOSITION:  PACU - hemodynamically stable.   Carlos Lawrence 7/25/201611:10 AM
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
## 2014-11-17 NOTE — Anesthesia Postprocedure Evaluation (Signed)
  Anesthesia Post-op Note  Patient: Carlos Lawrence  Procedure(s) Performed: Procedure(s): COLOSTOMY REVERSAL (N/A)  Patient Location: PACU  Anesthesia Type:General  Level of Consciousness: awake, alert , oriented and patient cooperative  Airway and Oxygen Therapy: Patient Spontanous Breathing and Patient connected to nasal cannula oxygen  Post-op Pain: mild  Post-op Assessment: Post-op Vital signs reviewed, Patient's Cardiovascular Status Stable, Respiratory Function Stable, Patent Airway, No signs of Nausea or vomiting and Pain level controlled              Post-op Vital Signs: Reviewed and stable  Last Vitals:  Filed Vitals:   11/17/14 1319  BP: 144/89  Pulse: 73  Temp: 36.4 C  Resp: 20    Complications: No apparent anesthesia complications

## 2014-11-17 NOTE — Transfer of Care (Signed)
Immediate Anesthesia Transfer of Care Note  Patient: Carlos Lawrence  Procedure(s) Performed: Procedure(s): COLOSTOMY REVERSAL (N/A)  Patient Location: PACU  Anesthesia Type:General  Level of Consciousness: awake, alert , oriented and patient cooperative  Airway & Oxygen Therapy: Patient Spontanous Breathing and Patient connected to nasal cannula oxygen  Post-op Assessment: Report given to RN and Post -op Vital signs reviewed and stable  Post vital signs: Reviewed and stable  Last Vitals:  Filed Vitals:   11/17/14 0647  BP: 150/96  Pulse:   Temp:   Resp:     Complications: No apparent anesthesia complications

## 2014-11-18 ENCOUNTER — Encounter (HOSPITAL_COMMUNITY): Payer: Self-pay | Admitting: General Surgery

## 2014-11-18 LAB — CBC
HCT: 36.7 % — ABNORMAL LOW (ref 39.0–52.0)
Hemoglobin: 13 g/dL (ref 13.0–17.0)
MCH: 26.6 pg (ref 26.0–34.0)
MCHC: 35.4 g/dL (ref 30.0–36.0)
MCV: 75.2 fL — ABNORMAL LOW (ref 78.0–100.0)
Platelets: 344 10*3/uL (ref 150–400)
RBC: 4.88 MIL/uL (ref 4.22–5.81)
RDW: 15.2 % (ref 11.5–15.5)
WBC: 21.1 10*3/uL — AB (ref 4.0–10.5)

## 2014-11-18 LAB — BASIC METABOLIC PANEL
ANION GAP: 10 (ref 5–15)
BUN: 5 mg/dL — ABNORMAL LOW (ref 6–20)
CALCIUM: 8.8 mg/dL — AB (ref 8.9–10.3)
CO2: 23 mmol/L (ref 22–32)
CREATININE: 0.81 mg/dL (ref 0.61–1.24)
Chloride: 95 mmol/L — ABNORMAL LOW (ref 101–111)
GFR calc Af Amer: >60 mL/min (ref >60)
GLUCOSE: 138 mg/dL — AB (ref 65–99)
Potassium: 4.1 mmol/L (ref 3.5–5.1)
Sodium: 128 mmol/L — ABNORMAL LOW (ref 135–145)

## 2014-11-18 MED ORDER — METHOCARBAMOL 500 MG PO TABS
1000.0000 mg | ORAL_TABLET | Freq: Three times a day (TID) | ORAL | Status: DC | PRN
  Administered 2014-11-19: 1000 mg via ORAL
  Filled 2014-11-18: qty 2
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
## 2014-11-18 NOTE — Progress Notes (Signed)
Encourage patient to get up and ambulate during rounds but patient continue to say it hurt and will try later. Mother at bedside and discuss about the importance of ambulation , but patient continue to say he will do it later.
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
## 2014-11-19 LAB — BASIC METABOLIC PANEL
Anion gap: 9 (ref 5–15)
BUN: 6 mg/dL (ref 6–20)
CALCIUM: 9.1 mg/dL (ref 8.9–10.3)
CHLORIDE: 97 mmol/L — AB (ref 101–111)
CO2: 25 mmol/L (ref 22–32)
Creatinine, Ser: 0.65 mg/dL (ref 0.61–1.24)
GFR calc non Af Amer: >60 mL/min (ref >60)
Glucose, Bld: 126 mg/dL — ABNORMAL HIGH (ref 65–99)
POTASSIUM: 4 mmol/L (ref 3.5–5.1)
Sodium: 131 mmol/L — ABNORMAL LOW (ref 135–145)

## 2014-11-19 LAB — CBC
HCT: 35.1 % — ABNORMAL LOW (ref 39.0–52.0)
HEMOGLOBIN: 12.5 g/dL — AB (ref 13.0–17.0)
MCH: 26.7 pg (ref 26.0–34.0)
MCHC: 35.6 g/dL (ref 30.0–36.0)
MCV: 74.8 fL — ABNORMAL LOW (ref 78.0–100.0)
Platelets: 308 10*3/uL (ref 150–400)
RBC: 4.69 MIL/uL (ref 4.22–5.81)
RDW: 15.2 % (ref 11.5–15.5)
WBC: 13.7 10*3/uL — AB (ref 4.0–10.5)

## 2014-11-19 MED ORDER — POLYETHYLENE GLYCOL 3350 17 G PO PACK
17.0000 g | PACK | Freq: Every day | ORAL | Status: DC
  Administered 2014-11-19 – 2014-11-21 (×3): 17 g via ORAL
  Filled 2014-11-19 (×3): qty 1

## 2014-11-19 MED ORDER — HYDROMORPHONE HCL 1 MG/ML IJ SOLN
2.0000 mg | INTRAMUSCULAR | Status: DC | PRN
  Administered 2014-11-19 (×4): 2 mg via INTRAVENOUS
  Filled 2014-11-19 (×3): qty 2

## 2014-11-19 MED ORDER — BACITRACIN ZINC 500 UNIT/GM EX OINT
TOPICAL_OINTMENT | Freq: Two times a day (BID) | CUTANEOUS | Status: DC
  Administered 2014-11-19 – 2014-11-21 (×5): via TOPICAL
  Filled 2014-11-19: qty 28.35

## 2014-11-19 MED ORDER — HYDROMORPHONE HCL 1 MG/ML IJ SOLN
1.0000 mg | INTRAMUSCULAR | Status: DC | PRN
  Administered 2014-11-19: 1 mg via INTRAVENOUS
  Filled 2014-11-19: qty 1

## 2014-11-19 MED ORDER — TRAMADOL HCL 50 MG PO TABS
100.0000 mg | ORAL_TABLET | Freq: Four times a day (QID) | ORAL | Status: DC
  Administered 2014-11-20 (×2): 100 mg via ORAL
  Filled 2014-11-19 (×5): qty 2

## 2014-11-19 MED ORDER — DOCUSATE SODIUM 100 MG PO CAPS
100.0000 mg | ORAL_CAPSULE | Freq: Two times a day (BID) | ORAL | Status: DC
  Administered 2014-11-19 – 2014-11-21 (×5): 100 mg via ORAL
  Filled 2014-11-19 (×5): qty 1

## 2014-11-19 MED ORDER — OXYCODONE HCL 5 MG PO TABS
10.0000 mg | ORAL_TABLET | ORAL | Status: DC | PRN
  Administered 2014-11-19 (×3): 15 mg via ORAL
  Administered 2014-11-20 – 2014-11-21 (×2): 20 mg via ORAL
  Filled 2014-11-19: qty 3
  Filled 2014-11-19 (×2): qty 4
  Filled 2014-11-19 (×2): qty 3

## 2014-11-19 MED ORDER — NAPROXEN 250 MG PO TABS
500.0000 mg | ORAL_TABLET | Freq: Two times a day (BID) | ORAL | Status: DC
  Administered 2014-11-20 – 2014-11-21 (×3): 500 mg via ORAL
  Filled 2014-11-19 (×4): qty 2
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
## 2014-11-20 MED ORDER — HYDROMORPHONE HCL 1 MG/ML IJ SOLN
2.0000 mg | INTRAMUSCULAR | Status: DC | PRN
  Administered 2014-11-20 – 2014-11-21 (×6): 2 mg via INTRAVENOUS
  Filled 2014-11-20 (×6): qty 2

## 2014-11-20 MED ORDER — HYDROMORPHONE HCL 1 MG/ML IJ SOLN
2.0000 mg | INTRAMUSCULAR | Status: DC | PRN
  Administered 2014-11-20 (×3): 2 mg via INTRAVENOUS
  Filled 2014-11-20 (×3): qty 2
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
## 2014-11-21 MED ORDER — NAPROXEN 500 MG PO TABS: 500.0000 mg | ORAL_TABLET | Freq: Two times a day (BID) | ORAL | Status: DC

## 2014-11-21 MED ORDER — OXYCODONE-ACETAMINOPHEN 10-325 MG PO TABS: 1.0000 | ORAL_TABLET | ORAL | Status: DC | PRN

## 2014-11-21 MED ORDER — MORPHINE SULFATE ER 30 MG PO TBCR
30.0000 mg | EXTENDED_RELEASE_TABLET | Freq: Two times a day (BID) | ORAL | Status: DC
Start: 2014-11-21 — End: 2016-01-05

## 2014-11-21 MED ORDER — TRAMADOL HCL 50 MG PO TABS: 100.0000 mg | ORAL_TABLET | Freq: Four times a day (QID) | ORAL | Status: DC

## 2014-11-21 NOTE — Progress Notes (Signed)
Carlos Lawrence to be D/C'd Home per MD order.  Discussed with the patient and all questions fully answered.  VSS, Skin clean, dry and intact without evidence of skin break down, no evidence of skin tears noted. IV catheter discontinued intact. Site without signs and symptoms of complications. Dressing and pressure applied. Discontinued penrose drain before discharge. Pt would not let RN change midline dressing prior to discharge only where the ostomy had been/drain site.   An After Visit Summary was printed and given to the patient. Patient received prescription.  D/c education completed with patient/family including follow up instructions, medication list, d/c activities limitations if indicated, with other d/c instructions as indicated by MD - patient able to verbalize understanding, all questions fully answered.   Patient instructed to return to ED, call 911, or call MD for any changes in condition.   Patient escorted via [place], and D/C home via private auto.  Carlos Lawrence 11/21/2014 1:10 PM

## 2014-11-21 NOTE — Care Management Note (Signed)
Case Management Note  Patient Details  Name: Carlos Lawrence MRN: [id number] Date of Birth: 05-25-1985  Subjective/Objective:                    Action/Plan: Explained and gave patient [place] letter . Entered exception for 14 days of Percocet , ultram , Morphine MC Contin and Naprosyn . Patient voiced understanding .   Provided [facility] and [facility]ness information.   Expected Discharge Date:                  Expected Discharge Plan:  Home/Self Care  In-House Referral:     Discharge planning Services  CM Consult, [place] [facility], Medication Assistance, [facility]  Post Acute Care Choice:    Choice offered to:     DME Arranged:    DME Agency:     HH Arranged:    [place] Agency:     Status of Service:  Completed, signed off  Medicare Important Message Given:    Date Medicare IM Given:    Medicare IM give by:    Date Additional Medicare IM Given:    Additional Medicare Important Message give by:     If discussed at [place] of Stay Meetings, dates discussed:    Additional Comments:  Marilu Favre, RN 11/21/2014, 12:36 PM
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
## 2014-11-21 NOTE — Discharge Instructions (Signed)
Wash wounds daily in shower with soap and water. Do not soak. Apply antibiotic ointment (e.g. Neosporin) twice daily and as needed to keep moist. Cover with dry dressing.  No lifting more than 5 pounds for 6 weeks.  No driving while taking oxycodone or morphine.

## 2014-12-22 ENCOUNTER — Other Ambulatory Visit: Payer: Self-pay | Admitting: Surgery

## 2014-12-22 DIAGNOSIS — R1084 Generalized abdominal pain: Secondary | ICD-10-CM

## 2014-12-22 DIAGNOSIS — R109 Unspecified abdominal pain: Secondary | ICD-10-CM

## 2014-12-25 ENCOUNTER — Encounter (HOSPITAL_COMMUNITY): Payer: Self-pay | Admitting: Emergency Medicine

## 2014-12-25 ENCOUNTER — Emergency Department (HOSPITAL_COMMUNITY): Payer: Self-pay

## 2014-12-25 ENCOUNTER — Observation Stay (HOSPITAL_COMMUNITY)
Admission: EM | Admit: 2014-12-25 | Discharge: 2014-12-26 | Disposition: A | Discharge status: Home / Self Care | Payer: Self-pay | Attending: General Surgery | Admitting: General Surgery

## 2014-12-25 DIAGNOSIS — N390 Urinary tract infection, site not specified: Secondary | ICD-10-CM | POA: Insufficient documentation

## 2014-12-25 DIAGNOSIS — R1110 Vomiting, unspecified: Secondary | ICD-10-CM

## 2014-12-25 DIAGNOSIS — J45909 Unspecified asthma, uncomplicated: Secondary | ICD-10-CM | POA: Insufficient documentation

## 2014-12-25 DIAGNOSIS — I10 Essential (primary) hypertension: Secondary | ICD-10-CM | POA: Insufficient documentation

## 2014-12-25 DIAGNOSIS — A047 Enterocolitis due to Clostridium difficile: Principal | ICD-10-CM | POA: Insufficient documentation

## 2014-12-25 DIAGNOSIS — Z87442 Personal history of urinary calculi: Secondary | ICD-10-CM | POA: Insufficient documentation

## 2014-12-25 DIAGNOSIS — K529 Noninfective gastroenteritis and colitis, unspecified: Secondary | ICD-10-CM

## 2014-12-25 DIAGNOSIS — R197 Diarrhea, unspecified: Secondary | ICD-10-CM

## 2014-12-25 DIAGNOSIS — Z7901 Long term (current) use of anticoagulants: Secondary | ICD-10-CM | POA: Insufficient documentation

## 2014-12-25 DIAGNOSIS — Z86718 Personal history of other venous thrombosis and embolism: Secondary | ICD-10-CM | POA: Insufficient documentation

## 2014-12-25 DIAGNOSIS — M1990 Unspecified osteoarthritis, unspecified site: Secondary | ICD-10-CM | POA: Insufficient documentation

## 2014-12-25 DIAGNOSIS — A0472 Enterocolitis due to Clostridium difficile, not specified as recurrent: Secondary | ICD-10-CM | POA: Diagnosis present

## 2014-12-25 DIAGNOSIS — F17210 Nicotine dependence, cigarettes, uncomplicated: Secondary | ICD-10-CM | POA: Insufficient documentation

## 2014-12-25 LAB — URINE MICROSCOPIC-ADD ON

## 2014-12-25 LAB — CBC WITH DIFFERENTIAL/PLATELET
BASOS ABS: 0 10*3/uL (ref 0.0–0.1)
BASOS PCT: 0 % (ref 0–1)
Eosinophils Absolute: 0.1 10*3/uL (ref 0.0–0.7)
Eosinophils Relative: 0 % (ref 0–5)
HEMATOCRIT: 35.3 % — AB (ref 39.0–52.0)
HEMOGLOBIN: 12.5 g/dL — AB (ref 13.0–17.0)
LYMPHS PCT: 4 % — AB (ref 12–46)
Lymphs Abs: 0.8 10*3/uL (ref 0.7–4.0)
MCH: 26.6 pg (ref 26.0–34.0)
MCHC: 35.4 g/dL (ref 30.0–36.0)
MCV: 75.1 fL — AB (ref 78.0–100.0)
MONO ABS: 0.8 10*3/uL (ref 0.1–1.0)
Monocytes Relative: 4 % (ref 3–12)
NEUTROS ABS: 19.1 10*3/uL — AB (ref 1.7–7.7)
NEUTROS PCT: 92 % — AB (ref 43–77)
Platelets: 441 10*3/uL — ABNORMAL HIGH (ref 150–400)
RBC: 4.7 MIL/uL (ref 4.22–5.81)
RDW: 14.9 % (ref 11.5–15.5)
WBC: 20.7 10*3/uL — ABNORMAL HIGH (ref 4.0–10.5)

## 2014-12-25 LAB — COMPREHENSIVE METABOLIC PANEL
ALK PHOS: 75 U/L (ref 38–126)
ALT: 6 U/L — ABNORMAL LOW (ref 17–63)
AST: 18 U/L (ref 15–41)
Albumin: 3.7 g/dL (ref 3.5–5.0)
Anion gap: 11 (ref 5–15)
BILIRUBIN TOTAL: 0.4 mg/dL (ref 0.3–1.2)
BUN: 9 mg/dL (ref 6–20)
CO2: 23 mmol/L (ref 22–32)
Calcium: 9.5 mg/dL (ref 8.9–10.3)
Chloride: 104 mmol/L (ref 101–111)
Creatinine, Ser: 0.81 mg/dL (ref 0.61–1.24)
GFR calc Af Amer: >60 mL/min (ref >60)
GFR calc non Af Amer: >60 mL/min (ref >60)
Glucose, Bld: 78 mg/dL (ref 65–99)
Potassium: 3.6 mmol/L (ref 3.5–5.1)
Sodium: 138 mmol/L (ref 135–145)
TOTAL PROTEIN: 8.5 g/dL — AB (ref 6.5–8.1)

## 2014-12-25 LAB — URINALYSIS, ROUTINE W REFLEX MICROSCOPIC
BILIRUBIN URINE: NEGATIVE
GLUCOSE, UA: NEGATIVE mg/dL
KETONES UR: 15 mg/dL — AB
NITRITE: NEGATIVE
Protein, ur: NEGATIVE mg/dL
SPECIFIC GRAVITY, URINE: 1.025 (ref 1.005–1.030)
Urobilinogen, UA: 0.2 mg/dL (ref 0.0–1.0)
pH: 6.5 (ref 5.0–8.0)

## 2014-12-25 LAB — LIPASE, BLOOD: Lipase: 10 U/L — ABNORMAL LOW (ref 22–51)

## 2014-12-25 MED ORDER — ENOXAPARIN SODIUM 40 MG/0.4ML ~~LOC~~ SOLN
40.0000 mg | Freq: Every day | SUBCUTANEOUS | Status: DC
  Administered 2014-12-25: 40 mg via SUBCUTANEOUS
  Filled 2014-12-25: qty 0.4

## 2014-12-25 MED ORDER — DIPHENHYDRAMINE HCL 12.5 MG/5ML PO ELIX: 12.5000 mg | ORAL_SOLUTION | Freq: Four times a day (QID) | ORAL | Status: DC | PRN

## 2014-12-25 MED ORDER — DIPHENHYDRAMINE HCL 50 MG/ML IJ SOLN: 12.5000 mg | Freq: Four times a day (QID) | INTRAMUSCULAR | Status: DC | PRN

## 2014-12-25 MED ORDER — SODIUM CHLORIDE 0.9 % IV SOLN
1000.0000 mL | Freq: Once | INTRAVENOUS | Status: AC
  Administered 2014-12-25: 1000 mL via INTRAVENOUS

## 2014-12-25 MED ORDER — IOHEXOL 300 MG/ML  SOLN
100.0000 mL | Freq: Once | INTRAMUSCULAR | Status: AC | PRN
  Administered 2014-12-25: 100 mL via INTRAVENOUS

## 2014-12-25 MED ORDER — HYDROMORPHONE HCL 1 MG/ML IJ SOLN
1.0000 mg | Freq: Once | INTRAMUSCULAR | Status: AC
  Administered 2014-12-25: 1 mg via INTRAVENOUS
  Filled 2014-12-25: qty 1

## 2014-12-25 MED ORDER — ONDANSETRON HCL 4 MG/2ML IJ SOLN
4.0000 mg | Freq: Four times a day (QID) | INTRAMUSCULAR | Status: DC | PRN
  Administered 2014-12-26: 4 mg via INTRAVENOUS
  Filled 2014-12-25 (×2): qty 2

## 2014-12-25 MED ORDER — IOHEXOL 300 MG/ML  SOLN
25.0000 mL | INTRAMUSCULAR | Status: AC
  Administered 2014-12-25 (×2): 25 mL via ORAL

## 2014-12-25 MED ORDER — MORPHINE SULFATE (PF) 2 MG/ML IV SOLN
1.0000 mg | INTRAVENOUS | Status: DC | PRN
  Administered 2014-12-25 – 2014-12-26 (×3): 2 mg via INTRAVENOUS
  Administered 2014-12-26: 3 mg via INTRAVENOUS
  Administered 2014-12-26 (×2): 2 mg via INTRAVENOUS
  Administered 2014-12-26 (×2): 3 mg via INTRAVENOUS
  Administered 2014-12-26 (×2): 2 mg via INTRAVENOUS
  Filled 2014-12-25: qty 2
  Filled 2014-12-25 (×4): qty 1
  Filled 2014-12-25: qty 2
  Filled 2014-12-25 (×3): qty 1
  Filled 2014-12-25: qty 2

## 2014-12-25 MED ORDER — PIPERACILLIN-TAZOBACTAM 3.375 G IVPB
3.3750 g | Freq: Three times a day (TID) | INTRAVENOUS | Status: DC
  Administered 2014-12-25 – 2014-12-26 (×2): 3.375 g via INTRAVENOUS
  Filled 2014-12-25 (×4): qty 50

## 2014-12-25 MED ORDER — ONDANSETRON HCL 4 MG/2ML IJ SOLN
4.0000 mg | Freq: Once | INTRAMUSCULAR | Status: AC
  Administered 2014-12-25: 4 mg via INTRAVENOUS
  Filled 2014-12-25: qty 2

## 2014-12-25 MED ORDER — PANTOPRAZOLE SODIUM 40 MG IV SOLR
40.0000 mg | Freq: Every day | INTRAVENOUS | Status: DC
  Administered 2014-12-25: 40 mg via INTRAVENOUS
  Filled 2014-12-25: qty 40

## 2014-12-25 MED ORDER — HYDROMORPHONE HCL 1 MG/ML IJ SOLN
1.0000 mg | INTRAMUSCULAR | Status: AC | PRN
  Administered 2014-12-25 (×3): 1 mg via INTRAVENOUS
  Filled 2014-12-25 (×3): qty 1

## 2014-12-25 MED ORDER — SODIUM CHLORIDE 0.9 % IV SOLN
1000.0000 mL | INTRAVENOUS | Status: DC
  Administered 2014-12-25 – 2014-12-26 (×3): 1000 mL via INTRAVENOUS

## 2014-12-25 MED ORDER — ZOLPIDEM TARTRATE 5 MG PO TABS
5.0000 mg | ORAL_TABLET | Freq: Every evening | ORAL | Status: DC | PRN
  Filled 2014-12-25: qty 1

## 2014-12-25 MED ORDER — ONDANSETRON 4 MG PO TBDP: 4.0000 mg | ORAL_TABLET | Freq: Four times a day (QID) | ORAL | Status: DC | PRN

## 2014-12-25 NOTE — ED Notes (Signed)
Surgeon at bedside.  
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
## 2014-12-25 NOTE — ED Notes (Signed)
Patient returned from CT

## 2014-12-25 NOTE — ED Notes (Signed)
Patient presents to ED via [facility] with abdominal pain.  Patient had colostomy reversal 3 weeks ago.  Per EMS, patient began having abdominal pain with nausea this morning.

## 2014-12-25 NOTE — ED Notes (Signed)
Called CT. Patient unable to drink contrast. MD verified for patient to go to CT.

## 2014-12-25 NOTE — ED Provider Notes (Signed)
Patient is still having intense abdominal pain over his colostomy takedown site in the right lower abdomen.  CBC significant for white count of 20,000 with a normal CMP and lipase. CT shows mild wall thickening of the left colon with some mild hazy mesenteric density adjacent to the colonic anastomosis staple lines which is thought to be possible postoperative edema. Also some mild dilation of the proximal colon without evidence of obstruction. Patient is still having nausea vomiting and diarrhea.  Will discuss with general surgery.  10:50 PM Dr. Redmond Pulling came to see the pt and he was started on zosyn and admitted for furhter care.  Blanchie Dessert, MD 12/25/14 2251

## 2014-12-26 ENCOUNTER — Inpatient Hospital Stay: Admission: RE | Admit: 2014-12-26 | Payer: Self-pay | Source: Ambulatory Visit

## 2014-12-26 DIAGNOSIS — N390 Urinary tract infection, site not specified: Secondary | ICD-10-CM | POA: Diagnosis present

## 2014-12-26 LAB — CBC
HEMATOCRIT: 32.2 % — AB (ref 39.0–52.0)
HEMOGLOBIN: 11.3 g/dL — AB (ref 13.0–17.0)
MCH: 26.3 pg (ref 26.0–34.0)
MCHC: 35.1 g/dL (ref 30.0–36.0)
MCV: 75.1 fL — AB (ref 78.0–100.0)
Platelets: 374 10*3/uL (ref 150–400)
RBC: 4.29 MIL/uL (ref 4.22–5.81)
RDW: 15.1 % (ref 11.5–15.5)
WBC: 18.5 10*3/uL — AB (ref 4.0–10.5)

## 2014-12-26 LAB — COMPREHENSIVE METABOLIC PANEL
ALBUMIN: 3 g/dL — AB (ref 3.5–5.0)
ALT: 8 U/L — ABNORMAL LOW (ref 17–63)
ANION GAP: 8 (ref 5–15)
AST: 14 U/L — AB (ref 15–41)
Alkaline Phosphatase: 64 U/L (ref 38–126)
BILIRUBIN TOTAL: 0.7 mg/dL (ref 0.3–1.2)
BUN: <5 mg/dL — ABNORMAL LOW (ref 6–20)
CHLORIDE: 105 mmol/L (ref 101–111)
CO2: 23 mmol/L (ref 22–32)
Calcium: 8.7 mg/dL — ABNORMAL LOW (ref 8.9–10.3)
Creatinine, Ser: 0.75 mg/dL (ref 0.61–1.24)
GFR calc Af Amer: >60 mL/min (ref >60)
GFR calc non Af Amer: >60 mL/min (ref >60)
GLUCOSE: 87 mg/dL (ref 65–99)
POTASSIUM: 3.6 mmol/L (ref 3.5–5.1)
SODIUM: 136 mmol/L (ref 135–145)
TOTAL PROTEIN: 6.8 g/dL (ref 6.5–8.1)

## 2014-12-26 LAB — MRSA PCR SCREENING: MRSA by PCR: NEGATIVE

## 2014-12-26 MED ORDER — METRONIDAZOLE 500 MG PO TABS
500.0000 mg | ORAL_TABLET | Freq: Three times a day (TID) | ORAL | Status: DC
  Administered 2014-12-26 (×2): 500 mg via ORAL
  Filled 2014-12-26 (×2): qty 1

## 2014-12-26 MED ORDER — APIXABAN 5 MG PO TABS: ORAL_TABLET | ORAL | Status: DC

## 2014-12-26 MED ORDER — CIPROFLOXACIN HCL 500 MG PO TABS: 500.0000 mg | ORAL_TABLET | Freq: Two times a day (BID) | ORAL | Status: DC

## 2014-12-26 MED ORDER — METRONIDAZOLE 500 MG PO TABS: 500.0000 mg | ORAL_TABLET | Freq: Three times a day (TID) | ORAL | Status: DC

## 2014-12-26 MED ORDER — CIPROFLOXACIN HCL 500 MG PO TABS
500.0000 mg | ORAL_TABLET | Freq: Two times a day (BID) | ORAL | Status: DC
  Administered 2014-12-26: 500 mg via ORAL
  Filled 2014-12-26 (×2): qty 1

## 2014-12-26 MED ORDER — OXYCODONE-ACETAMINOPHEN 10-325 MG PO TABS: 1.0000 | ORAL_TABLET | ORAL | Status: DC | PRN

## 2014-12-26 NOTE — Progress Notes (Signed)
Patient ID: Carlos Lawrence, male   DOB: 08-25-1985, 29 y.o.   MRN: [id number]  LOS: 2 days  Subjective: Feels better than yesterday, asking when he can go home. Denies urinary symptoms.   Objective: Vital signs in last 24 hours: Temp:  [98.8 F (37.1 C)-99.9 F (37.7 C)] 99.2 F (37.3 C) (09/02 0527) Pulse Rate:  [61-91] 61 (09/02 0527) Resp:  [16-18] 16 (09/02 0527) BP: (135-165)/(82-103) 154/91 mmHg (09/02 0527) SpO2:  [97 %-100 %] 100 % (09/02 0527) Weight:  [65.59 kg (144 lb 9.6 oz)] 65.59 kg (144 lb 9.6 oz) (09/01 2210) Last BM Date: 12/25/14   Laboratory  CBC  Recent Labs  12/25/14 1450 12/26/14 0308  WBC 20.7* 18.5*  HGB 12.5* 11.3*  HCT 35.3* 32.2*  PLT 441* 374   BMET  Recent Labs  12/25/14 1450 12/26/14 0308  NA 138 136  K 3.6 3.6  CL 104 105  CO2 23 23  GLUCOSE 78 87  BUN 9 <5*  CREATININE 0.81 0.75  CALCIUM 9.5 8.7*    Physical Exam General appearance: alert and no distress Resp: clear to auscultation bilaterally Cardio: regular rate and rhythm GI: normal findings: bowel sounds normal and soft, non-tender   Assessment/Plan: C diff colitis -- Give oral flagyl UTI -- Check urine culture, start oral cipro FEN -- Give diet VTE -- SCD's Dispo -- Could potentially go home this afternoon if the day goes well but may want 1 more day to make sure he's heading in the right direction. Will d/w MD.    [name], PA-C Pager: [phone] General Trauma PA Pager: [phone]  12/26/2014

## 2014-12-26 NOTE — Progress Notes (Signed)
Discussed discharge summary with patient. Reviewed all medications with patient. Patient received Rx. Patient ready for discharge. 

## 2014-12-26 NOTE — Discharge Summary (Signed)
Physician Discharge Summary  Patient ID: Carlos Lawrence MRN: [id number] DOB/AGE: 1985-11-16 29 y.o.  Admit date: 12/25/2014 Discharge date: 12/26/2014  Discharge Diagnoses Patient Active Problem List   Diagnosis Date Noted  . UTI (urinary tract infection) 12/26/2014  . Clostridium difficile colitis 12/25/2014  . Bladder leak   . Asthma 06/22/2014  . Essential hypertension   . Pyelonephritis 05/24/2014  . Sepsis 05/24/2014  . Left ureteral injury 05/07/2014  . Disorder of urinary system   . Dvt femoral (deep venous thrombosis) 04/28/2014  . Bladder injury 04/17/2014  . DVT (deep venous thrombosis) 09/17/2013    Consultants None   Procedures None   HPI: Carlos Lawrence underwent colostomy reversal on July 25 by Dr. Hulen Lawrence. He was discharged from home on July 29. He stated he was doing well until yesterday when he developed acute onset of nausea, vomiting, and diarrhea. He reported multiple loose stools. He reported chills last night. He stated that he has been able to drink liquids. He denied any melena or hematochezia. He denied any recent antibiotic usage. It appeared that he had not been taking his oral anticoagulative for his history of blood clot. He denied any dysuria. He came to the emergency room where he was found to have a white blood cell count of 20,000 and colitis involving the left colon as well as around the recent anastomosis. He was admitted to the trauma service.   Hospital Course: Testing was positive for a C difficile colitis and colonization vs infection of his urine. He had been started on Zosyn and this was changed to oral Cipro and Flagyl. He felt better by the next day and insisted on returning home. He was discharged in good condition.     Medication List    TAKE these medications        albuterol 108 (90 BASE) MCG/ACT inhaler  Commonly known as:  PROVENTIL HFA;VENTOLIN HFA  Inhale 2 puffs into the lungs every 6 (six) hours as needed for wheezing.     amLODipine 10 MG tablet  Commonly known as:  NORVASC  Take 1 tablet (10 mg total) by mouth daily.     apixaban 5 MG Tabs tablet  Commonly known as:  ELIQUIS  10mg  po bid x7d then 5mg  po bid; start when INR<2     ciprofloxacin 500 MG tablet  Commonly known as:  CIPRO  Take 1 tablet (500 mg total) by mouth 2 (two) times daily.     metroNIDAZOLE 500 MG tablet  Commonly known as:  FLAGYL  Take 1 tablet (500 mg total) by mouth every 8 (eight) hours.     morphine 30 MG 12 hr tablet  Commonly known as:  MS CONTIN  Take 1 tablet (30 mg total) by mouth every 12 (twelve) hours.     naproxen 500 MG tablet  Commonly known as:  NAPROSYN  Take 1 tablet (500 mg total) by mouth 2 (two) times daily with a meal.     oxyCODONE-acetaminophen 10-325 MG per tablet  Commonly known as:  PERCOCET  Take 1-2 tablets by mouth every 4 (four) hours as needed for pain.     traMADol 50 MG tablet  Commonly known as:  ULTRAM  Take 2 tablets (100 mg total) by mouth every 6 (six) hours.     Vitamin D (Ergocalciferol) 50000 UNITS Caps capsule  Commonly known as:  DRISDOL  Take 1 capsule (50,000 Units total) by mouth every 7 (seven) days.  Follow-up Information    Schedule an appointment as soon as possible for a visit with Judeth Horn, MD.   Specialty:  General Surgery   Contact information:   [address] [place] [address] [phone]       Schedule an appointment as soon as possible for a visit with [facility]    .   Contact information:   [place] [address] [phone]      Call [facility].   Why:  As needed   Contact information:   [address] [place] [address] [phone]       Signed: [name], PA-C Pager: [phone] General Trauma PA Pager: [phone] 12/26/2014, 2:24 PM

## 2014-12-31 LAB — C DIFFICILE QUICK SCREEN W PCR REFLEX
C DIFFICLE (CDIFF) ANTIGEN: POSITIVE — AB
C Diff interpretation: POSITIVE
C Diff toxin: POSITIVE — AB

## 2016-01-04 ENCOUNTER — Emergency Department (HOSPITAL_COMMUNITY)
Admission: EM | Admit: 2016-01-04 | Discharge: 2016-01-05 | Disposition: A | Discharge status: Home / Self Care | Payer: Self-pay | Attending: Emergency Medicine | Admitting: Emergency Medicine

## 2016-01-04 ENCOUNTER — Encounter (HOSPITAL_COMMUNITY): Payer: Self-pay | Admitting: *Deleted

## 2016-01-04 ENCOUNTER — Emergency Department (HOSPITAL_COMMUNITY): Payer: Self-pay

## 2016-01-04 DIAGNOSIS — Z79899 Other long term (current) drug therapy: Secondary | ICD-10-CM | POA: Insufficient documentation

## 2016-01-04 DIAGNOSIS — R109 Unspecified abdominal pain: Secondary | ICD-10-CM | POA: Insufficient documentation

## 2016-01-04 DIAGNOSIS — F17210 Nicotine dependence, cigarettes, uncomplicated: Secondary | ICD-10-CM | POA: Insufficient documentation

## 2016-01-04 DIAGNOSIS — Z7901 Long term (current) use of anticoagulants: Secondary | ICD-10-CM | POA: Insufficient documentation

## 2016-01-04 DIAGNOSIS — I10 Essential (primary) hypertension: Secondary | ICD-10-CM | POA: Insufficient documentation

## 2016-01-04 DIAGNOSIS — J45909 Unspecified asthma, uncomplicated: Secondary | ICD-10-CM | POA: Insufficient documentation

## 2016-01-04 DIAGNOSIS — N1330 Unspecified hydronephrosis: Secondary | ICD-10-CM | POA: Insufficient documentation

## 2016-01-04 LAB — URINALYSIS, ROUTINE W REFLEX MICROSCOPIC
Bilirubin Urine: NEGATIVE
GLUCOSE, UA: NEGATIVE mg/dL
Ketones, ur: 15 mg/dL — AB
Nitrite: NEGATIVE
Protein, ur: 30 mg/dL — AB
SPECIFIC GRAVITY, URINE: 1.043 — AB (ref 1.005–1.030)
pH: 6.5 (ref 5.0–8.0)

## 2016-01-04 LAB — CBC
HCT: 41.3 % (ref 39.0–52.0)
HEMOGLOBIN: 14.5 g/dL (ref 13.0–17.0)
MCH: 27.1 pg (ref 26.0–34.0)
MCHC: 35.1 g/dL (ref 30.0–36.0)
MCV: 77.2 fL — AB (ref 78.0–100.0)
Platelets: 357 10*3/uL (ref 150–400)
RBC: 5.35 MIL/uL (ref 4.22–5.81)
RDW: 14.6 % (ref 11.5–15.5)
WBC: 8.6 10*3/uL (ref 4.0–10.5)

## 2016-01-04 LAB — LIPASE, BLOOD: LIPASE: 17 U/L (ref 11–51)

## 2016-01-04 LAB — COMPREHENSIVE METABOLIC PANEL
ALBUMIN: 4 g/dL (ref 3.5–5.0)
ALT: 14 U/L — AB (ref 17–63)
ANION GAP: 9 (ref 5–15)
AST: 25 U/L (ref 15–41)
Alkaline Phosphatase: 65 U/L (ref 38–126)
BUN: 12 mg/dL (ref 6–20)
CHLORIDE: 105 mmol/L (ref 101–111)
CO2: 22 mmol/L (ref 22–32)
CREATININE: 0.93 mg/dL (ref 0.61–1.24)
Calcium: 9.5 mg/dL (ref 8.9–10.3)
GFR calc non Af Amer: >60 mL/min (ref >60)
Glucose, Bld: 93 mg/dL (ref 65–99)
Potassium: 4.3 mmol/L (ref 3.5–5.1)
SODIUM: 136 mmol/L (ref 135–145)
Total Bilirubin: 0.6 mg/dL (ref 0.3–1.2)
Total Protein: 8.2 g/dL — ABNORMAL HIGH (ref 6.5–8.1)

## 2016-01-04 LAB — URINE MICROSCOPIC-ADD ON: Squamous Epithelial / LPF: NONE SEEN

## 2016-01-04 MED ORDER — MORPHINE SULFATE (PF) 4 MG/ML IV SOLN
4.0000 mg | Freq: Once | INTRAVENOUS | Status: AC
  Administered 2016-01-04: 4 mg via INTRAVENOUS
  Filled 2016-01-04: qty 1

## 2016-01-04 MED ORDER — MORPHINE SULFATE (PF) 4 MG/ML IV SOLN: 4.0000 mg | Freq: Once | INTRAVENOUS | Status: DC

## 2016-01-04 MED ORDER — IOPAMIDOL (ISOVUE-300) INJECTION 61%
INTRAVENOUS | Status: AC
  Administered 2016-01-04: 100 mL
  Filled 2016-01-04: qty 100

## 2016-01-04 NOTE — ED Notes (Signed)
Updated Pt on Wait time. Pt does not need anything at this time. Pt aware that some lab results have come back and Dr will go over results once in room.
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
## 2016-01-04 NOTE — ED Notes (Signed)
Pt back from CT

## 2016-01-04 NOTE — ED Notes (Signed)
Pt requesting more pain medication.
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
## 2016-01-05 MED ORDER — OXYCODONE-ACETAMINOPHEN 5-325 MG PO TABS
1.0000 | ORAL_TABLET | ORAL | 0 refills | Status: DC | PRN
Start: 2016-01-05 — End: 2016-03-03

## 2016-01-05 NOTE — ED Notes (Signed)
Pt getting anxious about the long wait, states he is been waiting since 2 pm, and he will like to have some answers, Dr. Roxanne Mins notified, pt updated that CT results are available for MD to review and he will be on his room in few minutes, pt verbalized understanding.

## 2016-01-05 NOTE — Discharge Instructions (Signed)
Please make an appointment to see her surgeon regarding the area where your wound opened up. I do not believe it needs any special attention, but I would like your surgeon to have evaluate it. Also, please make an appointment with your urologist to evaluate the swelling of the left kidney. Return to the emergency department if symptoms worsen.

## 2016-02-28 ENCOUNTER — Inpatient Hospital Stay (HOSPITAL_COMMUNITY)
Admission: EM | Admit: 2016-02-28 | Discharge: 2016-03-03 | DRG: 690 | Disposition: A | Discharge status: Home / Self Care | Payer: Self-pay | Attending: Family Medicine | Admitting: Family Medicine

## 2016-02-28 ENCOUNTER — Encounter (HOSPITAL_COMMUNITY): Payer: Self-pay | Admitting: Emergency Medicine

## 2016-02-28 DIAGNOSIS — N136 Pyonephrosis: Principal | ICD-10-CM | POA: Diagnosis present

## 2016-02-28 DIAGNOSIS — F17210 Nicotine dependence, cigarettes, uncomplicated: Secondary | ICD-10-CM | POA: Diagnosis present

## 2016-02-28 DIAGNOSIS — S3710XA Unspecified injury of ureter, initial encounter: Secondary | ICD-10-CM | POA: Diagnosis present

## 2016-02-28 DIAGNOSIS — I10 Essential (primary) hypertension: Secondary | ICD-10-CM | POA: Diagnosis present

## 2016-02-28 DIAGNOSIS — N12 Tubulo-interstitial nephritis, not specified as acute or chronic: Secondary | ICD-10-CM

## 2016-02-28 DIAGNOSIS — Z87442 Personal history of urinary calculi: Secondary | ICD-10-CM

## 2016-02-28 DIAGNOSIS — N1330 Unspecified hydronephrosis: Secondary | ICD-10-CM | POA: Diagnosis present

## 2016-02-28 DIAGNOSIS — Z79899 Other long term (current) drug therapy: Secondary | ICD-10-CM

## 2016-02-28 DIAGNOSIS — Z86718 Personal history of other venous thrombosis and embolism: Secondary | ICD-10-CM

## 2016-02-28 DIAGNOSIS — N390 Urinary tract infection, site not specified: Secondary | ICD-10-CM | POA: Diagnosis present

## 2016-02-28 DIAGNOSIS — Z9119 Patient's noncompliance with other medical treatment and regimen: Secondary | ICD-10-CM

## 2016-02-28 DIAGNOSIS — B3749 Other urogenital candidiasis: Secondary | ICD-10-CM | POA: Diagnosis present

## 2016-02-28 DIAGNOSIS — Z833 Family history of diabetes mellitus: Secondary | ICD-10-CM

## 2016-02-28 DIAGNOSIS — Z933 Colostomy status: Secondary | ICD-10-CM

## 2016-02-28 DIAGNOSIS — Z8249 Family history of ischemic heart disease and other diseases of the circulatory system: Secondary | ICD-10-CM
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
## 2016-02-29 ENCOUNTER — Encounter (HOSPITAL_COMMUNITY): Payer: Self-pay | Admitting: Radiology

## 2016-02-29 ENCOUNTER — Emergency Department (HOSPITAL_COMMUNITY): Payer: Self-pay

## 2016-02-29 DIAGNOSIS — N136 Pyonephrosis: Secondary | ICD-10-CM | POA: Diagnosis present

## 2016-02-29 DIAGNOSIS — N1330 Unspecified hydronephrosis: Secondary | ICD-10-CM | POA: Diagnosis present

## 2016-02-29 LAB — COMPREHENSIVE METABOLIC PANEL
ALBUMIN: 4.2 g/dL (ref 3.5–5.0)
ALT: 10 U/L — AB (ref 17–63)
AST: 23 U/L (ref 15–41)
Alkaline Phosphatase: 62 U/L (ref 38–126)
Anion gap: 9 (ref 5–15)
BILIRUBIN TOTAL: 0.5 mg/dL (ref 0.3–1.2)
BUN: 12 mg/dL (ref 6–20)
CO2: 25 mmol/L (ref 22–32)
CREATININE: 1.04 mg/dL (ref 0.61–1.24)
Calcium: 9.9 mg/dL (ref 8.9–10.3)
Chloride: 105 mmol/L (ref 101–111)
GFR calc Af Amer: >60 mL/min (ref >60)
GLUCOSE: 103 mg/dL — AB (ref 65–99)
POTASSIUM: 4.1 mmol/L (ref 3.5–5.1)
Sodium: 139 mmol/L (ref 135–145)
TOTAL PROTEIN: 7.8 g/dL (ref 6.5–8.1)

## 2016-02-29 LAB — URINALYSIS, ROUTINE W REFLEX MICROSCOPIC
Bilirubin Urine: NEGATIVE
GLUCOSE, UA: NEGATIVE mg/dL
KETONES UR: 40 mg/dL — AB
Nitrite: NEGATIVE
PH: 5.5 (ref 5.0–8.0)
Protein, ur: NEGATIVE mg/dL
Specific Gravity, Urine: 1.015 (ref 1.005–1.030)

## 2016-02-29 LAB — LIPASE, BLOOD: LIPASE: 19 U/L (ref 11–51)

## 2016-02-29 LAB — URINE MICROSCOPIC-ADD ON

## 2016-02-29 LAB — CBC WITH DIFFERENTIAL/PLATELET
BASOS ABS: 0 10*3/uL (ref 0.0–0.1)
BASOS PCT: 0 %
Eosinophils Absolute: 0 10*3/uL (ref 0.0–0.7)
Eosinophils Relative: 0 %
HEMATOCRIT: 40.9 % (ref 39.0–52.0)
HEMOGLOBIN: 14.4 g/dL (ref 13.0–17.0)
LYMPHS PCT: 8 %
Lymphs Abs: 1 10*3/uL (ref 0.7–4.0)
MCH: 26.9 pg (ref 26.0–34.0)
MCHC: 35.2 g/dL (ref 30.0–36.0)
MCV: 76.4 fL — AB (ref 78.0–100.0)
MONO ABS: 0.5 10*3/uL (ref 0.1–1.0)
Monocytes Relative: 4 %
NEUTROS ABS: 11.4 10*3/uL — AB (ref 1.7–7.7)
NEUTROS PCT: 88 %
Platelets: 303 10*3/uL (ref 150–400)
RBC: 5.35 MIL/uL (ref 4.22–5.81)
RDW: 15.7 % — AB (ref 11.5–15.5)
WBC: 12.8 10*3/uL — AB (ref 4.0–10.5)

## 2016-02-29 LAB — MRSA PCR SCREENING: MRSA by PCR: NEGATIVE

## 2016-02-29 LAB — I-STAT CG4 LACTIC ACID, ED: Lactic Acid, Venous: 0.85 mmol/L (ref 0.5–1.9)

## 2016-02-29 MED ORDER — SODIUM CHLORIDE 0.9 % IV BOLUS (SEPSIS)
1000.0000 mL | Freq: Once | INTRAVENOUS | Status: AC
  Administered 2016-02-29: 1000 mL via INTRAVENOUS

## 2016-02-29 MED ORDER — ONDANSETRON HCL 4 MG/2ML IJ SOLN
4.0000 mg | Freq: Four times a day (QID) | INTRAMUSCULAR | Status: DC | PRN
  Administered 2016-02-29 – 2016-03-02 (×2): 4 mg via INTRAVENOUS
  Filled 2016-02-29 (×2): qty 2

## 2016-02-29 MED ORDER — FENTANYL CITRATE (PF) 100 MCG/2ML IJ SOLN
100.0000 ug | Freq: Once | INTRAMUSCULAR | Status: AC
  Administered 2016-02-29: 100 ug via INTRAVENOUS
  Filled 2016-02-29: qty 2

## 2016-02-29 MED ORDER — ONDANSETRON HCL 4 MG/2ML IJ SOLN
4.0000 mg | Freq: Once | INTRAMUSCULAR | Status: AC
  Administered 2016-02-29: 4 mg via INTRAVENOUS
  Filled 2016-02-29: qty 2

## 2016-02-29 MED ORDER — DEXTROSE 5 % IV SOLN
1.0000 g | Freq: Once | INTRAVENOUS | Status: AC
  Administered 2016-02-29: 1 g via INTRAVENOUS
  Filled 2016-02-29: qty 10

## 2016-02-29 MED ORDER — HYDROMORPHONE HCL 1 MG/ML IJ SOLN
1.0000 mg | INTRAMUSCULAR | Status: DC | PRN
  Administered 2016-02-29 (×5): 1 mg via INTRAVENOUS
  Filled 2016-02-29 (×5): qty 1

## 2016-02-29 MED ORDER — HYDROMORPHONE HCL 2 MG/ML IJ SOLN
2.0000 mg | INTRAMUSCULAR | Status: DC | PRN
  Administered 2016-02-29 – 2016-03-03 (×17): 2 mg via INTRAVENOUS
  Filled 2016-02-29 (×17): qty 1

## 2016-02-29 MED ORDER — KETOROLAC TROMETHAMINE 30 MG/ML IJ SOLN
30.0000 mg | Freq: Once | INTRAMUSCULAR | Status: AC
  Administered 2016-02-29: 30 mg via INTRAVENOUS
  Filled 2016-02-29: qty 1

## 2016-02-29 MED ORDER — ENOXAPARIN SODIUM 40 MG/0.4ML ~~LOC~~ SOLN
40.0000 mg | SUBCUTANEOUS | Status: DC
  Administered 2016-02-29 – 2016-03-03 (×4): 40 mg via SUBCUTANEOUS
  Filled 2016-02-29 (×4): qty 0.4

## 2016-02-29 MED ORDER — FLUCONAZOLE IN SODIUM CHLORIDE 200-0.9 MG/100ML-% IV SOLN
200.0000 mg | INTRAVENOUS | Status: DC
  Administered 2016-03-01 – 2016-03-03 (×3): 200 mg via INTRAVENOUS
  Filled 2016-02-29 (×3): qty 100

## 2016-02-29 MED ORDER — HYDROMORPHONE HCL 2 MG/ML IJ SOLN
1.0000 mg | INTRAMUSCULAR | Status: DC | PRN
  Administered 2016-02-29 (×2): 1 mg via INTRAVENOUS
  Filled 2016-02-29 (×2): qty 1

## 2016-02-29 MED ORDER — SODIUM CHLORIDE 0.9 % IV SOLN
INTRAVENOUS | Status: DC
  Administered 2016-02-29 – 2016-03-03 (×7): via INTRAVENOUS

## 2016-02-29 MED ORDER — SODIUM CHLORIDE 0.9 % IV SOLN: INTRAVENOUS | Status: DC

## 2016-02-29 MED ORDER — FLUCONAZOLE IN SODIUM CHLORIDE 400-0.9 MG/200ML-% IV SOLN
400.0000 mg | Freq: Once | INTRAVENOUS | Status: AC
  Administered 2016-02-29: 400 mg via INTRAVENOUS
  Filled 2016-02-29: qty 200

## 2016-02-29 MED ORDER — FLUCONAZOLE IN SODIUM CHLORIDE 400-0.9 MG/200ML-% IV SOLN: 400.0000 mg | INTRAVENOUS | Status: DC

## 2016-02-29 MED ORDER — HYDROMORPHONE HCL 2 MG/ML IJ SOLN: 1.0000 mg | INTRAMUSCULAR | Status: DC | PRN

## 2016-02-29 MED ORDER — DEXTROSE 5 % IV SOLN
1.0000 g | INTRAVENOUS | Status: DC
  Administered 2016-03-01: 1 g via INTRAVENOUS
  Filled 2016-02-29: qty 10

## 2016-02-29 MED ORDER — HYDROMORPHONE HCL 2 MG/ML IJ SOLN
1.0000 mg | Freq: Once | INTRAMUSCULAR | Status: AC
  Administered 2016-02-29: 1 mg via INTRAVENOUS
  Filled 2016-02-29: qty 1

## 2016-02-29 MED ORDER — METHOCARBAMOL 500 MG PO TABS
500.0000 mg | ORAL_TABLET | Freq: Four times a day (QID) | ORAL | Status: DC | PRN
  Administered 2016-02-29 – 2016-03-03 (×2): 500 mg via ORAL
  Filled 2016-02-29 (×2): qty 1

## 2016-02-29 MED ORDER — IOPAMIDOL (ISOVUE-300) INJECTION 61%
INTRAVENOUS | Status: AC
  Administered 2016-02-29: 100 mL
  Filled 2016-02-29: qty 100

## 2016-02-29 NOTE — ED Notes (Signed)
Pt aware of need for urine. Pt states he doesn't want to be cathed and that he can pee.
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
## 2016-02-29 NOTE — ED Notes (Signed)
Pt in CT at this time.

## 2016-02-29 NOTE — Progress Notes (Signed)
Pharmacy Antibiotic Note  Carlos Lawrence is a 30 y.o. male admitted on 02/28/2016 with UTI - need for ureteral stent change.  Pharmacy has been consulted for Diflucan dosing. Pt also on Rocephin per physician.  Diflucan 400mg  IV ordered in ED ~0345 11/6  Plan: Diflucan 400mg  IV daily x1 then 200mg  IV q24h as per IDSA guidelines Will f/u renal function, micro data, and LOT  Height: 5\' 7"  (170.2 cm) Weight: 169 lb 11.2 oz (77 kg) IBW/kg (Calculated) : 66.1  Temp (24hrs), Avg:99.4 F (37.4 C), Min:99 F (37.2 C), Max:99.7 F (37.6 C)   Recent Labs Lab 02/29/16 0010 02/29/16 0247  WBC 12.8*  --   CREATININE 1.04  --   LATICACIDVEN  --  0.85    Estimated Creatinine Clearance: 98 mL/min (by C-G formula based on SCr of 1.04 mg/dL).    No Known Allergies  Antimicrobials this admission: 11/6 rocephin >>  11/6 diflucan >>   Microbiology results: 11/6 BCx x2:  11/6 UCx:    Thank you for allowing pharmacy to be a part of this patient's care.  Doreene Eland, PharmD, BCPS.   Pager: [phone] 02/29/2016 11:18 AM

## 2016-02-29 NOTE — Progress Notes (Signed)
Reviewed chart Treat all positive cultures Ideally it would be good to change stent before he goes home Needs to settle down for a few days Compliance issue clinically VERY significant Has a complicated problem to manage May need reconstructive surgery referral in future Will see tomorrow

## 2016-02-29 NOTE — Progress Notes (Signed)
Pt has complained of rating 9-10 on left flank area. Pt has been medicated every 2 hours with prescribed pain med. MD called for additional pain management. Orders received. SRP, RN

## 2016-02-29 NOTE — ED Notes (Signed)
Pt reminded multiple times we need urine sample. Urinal provided

## 2016-02-29 NOTE — Progress Notes (Signed)
Pharmacy Antibiotic Note  TAYMAR NIVAR is a 30 y.o. male admitted on 02/28/2016 with UTI - need for ureteral stent change.  Pharmacy has been consulted for Diflucan dosing. Pt also on Rocephin.  Diflucan 400mg  IV ordered in ED ~0345 - not charted yet.  Plan: Diflucan 400mg  IV daily Will f/u renal function, micro data, and LOT  Height: 5\' 7"  (170.2 cm) Weight: 167 lb (75.8 kg) IBW/kg (Calculated) : 66.1  Temp (24hrs), Avg:99.4 F (37.4 C), Min:99 F (37.2 C), Max:99.7 F (37.6 C)   Recent Labs Lab 02/29/16 0010 02/29/16 0247  WBC 12.8*  --   CREATININE 1.04  --   LATICACIDVEN  --  0.85    Estimated Creatinine Clearance: 98 mL/min (by C-G formula based on SCr of 1.04 mg/dL).    No Known Allergies  Antimicrobials this admission: 11/6 rocephin >>  11/6 diflucan >>   Microbiology results: 11/6 BCx x2:  11/6 UCx:    Thank you for allowing pharmacy to be a part of this patient's care.  Sherlon Handing, PharmD, BCPS Clinical pharmacist, pager [phone] 02/29/2016 4:43 AM
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
## 2016-03-01 ENCOUNTER — Other Ambulatory Visit: Payer: Self-pay | Admitting: Urology

## 2016-03-01 ENCOUNTER — Encounter (HOSPITAL_COMMUNITY): Payer: Self-pay | Admitting: Anesthesiology

## 2016-03-01 LAB — URINE CULTURE: Culture: 60000 — AB

## 2016-03-01 LAB — CBC
HCT: 33.8 % — ABNORMAL LOW (ref 39.0–52.0)
HEMOGLOBIN: 11.7 g/dL — AB (ref 13.0–17.0)
MCH: 26.7 pg (ref 26.0–34.0)
MCHC: 34.6 g/dL (ref 30.0–36.0)
MCV: 77.2 fL — ABNORMAL LOW (ref 78.0–100.0)
Platelets: 280 10*3/uL (ref 150–400)
RBC: 4.38 MIL/uL (ref 4.22–5.81)
RDW: 15.4 % (ref 11.5–15.5)
WBC: 6.6 10*3/uL (ref 4.0–10.5)

## 2016-03-01 LAB — BASIC METABOLIC PANEL
Anion gap: 6 (ref 5–15)
BUN: 7 mg/dL (ref 6–20)
CALCIUM: 8.7 mg/dL — AB (ref 8.9–10.3)
CHLORIDE: 106 mmol/L (ref 101–111)
CO2: 26 mmol/L (ref 22–32)
CREATININE: 0.84 mg/dL (ref 0.61–1.24)
Glucose, Bld: 88 mg/dL (ref 65–99)
Potassium: 3.6 mmol/L (ref 3.5–5.1)
SODIUM: 138 mmol/L (ref 135–145)

## 2016-03-01 MED ORDER — AMLODIPINE BESYLATE 10 MG PO TABS
10.0000 mg | ORAL_TABLET | Freq: Every day | ORAL | Status: DC
  Administered 2016-03-01 – 2016-03-03 (×3): 10 mg via ORAL
  Filled 2016-03-01 (×3): qty 1
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
## 2016-03-01 NOTE — Progress Notes (Signed)
Patient is scheduled for cystoscopy, cystogram, left retrograde pyelogram, left diagnostic ureteroscopy, and left ureteral stent exchange on THURSDAY, 03/03/16 at 10AM. Patient informed. Consent signed, witnessed, in chart. I have made patient NPO after midnight tomorrow.

## 2016-03-01 NOTE — Progress Notes (Signed)
Reviewed chart again Similar pain in September no treatment of f/up Urine c/s yeast and kidney suggest pyelonephritis Pain much better today Dian Situ picture and explained situation I recommended cystoscopy and change left stent change and cystogram to measure bladder capacity and u scope  Pros and cons and risks discussed in full detail; including all sequelae including perc tube and retained stent/portion of stent I recommend not to leave him stent free in face of likely infection Will arrange for tomorrow or tursday BLOOD CULTURES ARE PENDING PLEASE HAVE ID SEE PATIENT AND RECOMMEND HOW LONG TO TREAT WITH ANTIFUNGALS ASSUMING HE MAY HAVE HAD A PYELO WITH YEAST VS. COLONIZATION Thanks

## 2016-03-01 NOTE — Progress Notes (Signed)
Resumed care of patient. Orders reviewed. Will continue to monitor.

## 2016-03-01 NOTE — Anesthesia Preprocedure Evaluation (Deleted)
Anesthesia Evaluation    Airway        Dental   Pulmonary asthma , Current Smoker,           Cardiovascular hypertension, Pt. on medications + DVT       Neuro/Psych    GI/Hepatic negative GI ROS, Neg liver ROS,   Endo/Other  negative endocrine ROS  Renal/GU Renal disease     Musculoskeletal  (+) Arthritis ,   Abdominal   Peds  Hematology negative hematology ROS (+) anemia ,   Anesthesia Other Findings   Reproductive/Obstetrics                             Lab Results  Component Value Date   WBC 6.6 03/01/2016   HGB 11.7 (L) 03/01/2016   HCT 33.8 (L) 03/01/2016   MCV 77.2 (L) 03/01/2016   PLT 280 03/01/2016   Lab Results  Component Value Date   CREATININE 0.84 03/01/2016   BUN 7 03/01/2016   NA 138 03/01/2016   K 3.6 03/01/2016   CL 106 03/01/2016   CO2 26 03/01/2016    Anesthesia Physical Anesthesia Plan  ASA: II  Anesthesia Plan: General   Post-op Pain Management:    Induction: Intravenous  Airway Management Planned: LMA  Additional Equipment:   Intra-op Plan:   Post-operative Plan: Extubation in OR  Informed Consent:   Plan Discussed with:   Anesthesia Plan Comments:         Anesthesia Quick Evaluation

## 2016-03-02 ENCOUNTER — Encounter (HOSPITAL_COMMUNITY): Payer: Self-pay | Admitting: Certified Registered"

## 2016-03-02 ENCOUNTER — Encounter (HOSPITAL_COMMUNITY)
Admission: EM | Disposition: A | Discharge status: Home / Self Care | Payer: Self-pay | Source: Home / Self Care | Attending: Family Medicine

## 2016-03-02 ENCOUNTER — Other Ambulatory Visit: Payer: Self-pay | Admitting: Urology

## 2016-03-02 DIAGNOSIS — S3710XD Unspecified injury of ureter, subsequent encounter: Secondary | ICD-10-CM

## 2016-03-02 DIAGNOSIS — N136 Pyonephrosis: Principal | ICD-10-CM

## 2016-03-02 DIAGNOSIS — I10 Essential (primary) hypertension: Secondary | ICD-10-CM

## 2016-03-02 DIAGNOSIS — N1330 Unspecified hydronephrosis: Secondary | ICD-10-CM

## 2016-03-02 DIAGNOSIS — N10 Acute tubulo-interstitial nephritis: Secondary | ICD-10-CM

## 2016-03-02 LAB — CBC
HCT: 35.2 % — ABNORMAL LOW (ref 39.0–52.0)
Hemoglobin: 12.2 g/dL — ABNORMAL LOW (ref 13.0–17.0)
MCH: 26.5 pg (ref 26.0–34.0)
MCHC: 34.7 g/dL (ref 30.0–36.0)
MCV: 76.5 fL — AB (ref 78.0–100.0)
PLATELETS: 288 10*3/uL (ref 150–400)
RBC: 4.6 MIL/uL (ref 4.22–5.81)
RDW: 15.2 % (ref 11.5–15.5)
WBC: 6.1 10*3/uL (ref 4.0–10.5)

## 2016-03-02 LAB — BASIC METABOLIC PANEL
Anion gap: 8 (ref 5–15)
BUN: 10 mg/dL (ref 6–20)
CHLORIDE: 104 mmol/L (ref 101–111)
CO2: 25 mmol/L (ref 22–32)
Calcium: 8.8 mg/dL — ABNORMAL LOW (ref 8.9–10.3)
Creatinine, Ser: 0.85 mg/dL (ref 0.61–1.24)
GFR calc Af Amer: >60 mL/min (ref >60)
GFR calc non Af Amer: >60 mL/min (ref >60)
GLUCOSE: 92 mg/dL (ref 65–99)
POTASSIUM: 3.4 mmol/L — AB (ref 3.5–5.1)
Sodium: 137 mmol/L (ref 135–145)

## 2016-03-02 SURGERY — CYSTOSCOPY/RETROGRADE/URETEROSCOPY
Anesthesia: General | Laterality: Left

## 2016-03-02 MED ORDER — POTASSIUM CHLORIDE CRYS ER 20 MEQ PO TBCR
40.0000 meq | EXTENDED_RELEASE_TABLET | Freq: Once | ORAL | Status: AC
  Administered 2016-03-02: 40 meq via ORAL
  Filled 2016-03-02: qty 2

## 2016-03-02 MED ORDER — PROPOFOL 10 MG/ML IV BOLUS
INTRAVENOUS | Status: AC
  Filled 2016-03-02: qty 20

## 2016-03-02 MED ORDER — ACETAMINOPHEN 325 MG PO TABS
650.0000 mg | ORAL_TABLET | Freq: Four times a day (QID) | ORAL | Status: DC | PRN
  Administered 2016-03-02: 650 mg via ORAL
  Filled 2016-03-02: qty 2

## 2016-03-02 MED ORDER — FENTANYL CITRATE (PF) 100 MCG/2ML IJ SOLN
INTRAMUSCULAR | Status: AC
  Filled 2016-03-02: qty 2

## 2016-03-02 MED ORDER — MIDAZOLAM HCL 2 MG/2ML IJ SOLN
INTRAMUSCULAR | Status: AC
  Filled 2016-03-02: qty 2

## 2016-03-02 NOTE — Care Management Note (Signed)
Case Management Note  Patient Details  Name: Carlos Lawrence MRN: [id number] Date of Birth: [birth year]  Subjective/Objective: 30 y/o m admitted w/pyohydronephrosis. From home.                   Action/Plan:d/c plan home.   Expected Discharge Date:   (unknown)               Expected Discharge Plan:  Home/Self Care  In-House Referral:     Discharge planning Services  CM Consult  Post Acute Care Choice:    Choice offered to:     DME Arranged:    DME Agency:     HH Arranged:    HH Agency:     Status of Service:  In process, will continue to follow  If discussed at Long Length of Stay Meetings, dates discussed:    Additional Comments:  Dessa Phi, RN 03/02/2016, 3:51 PM
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
## 2016-03-02 NOTE — Progress Notes (Signed)
Reviewed chart and ID call recommendations of 14 days of diflucan Spoke with pt ? Go home late tomorrow pm vs. Friday am depending on case

## 2016-03-03 ENCOUNTER — Inpatient Hospital Stay (HOSPITAL_COMMUNITY): Payer: Self-pay | Admitting: Registered Nurse

## 2016-03-03 ENCOUNTER — Encounter (HOSPITAL_COMMUNITY)
Admission: EM | Disposition: A | Discharge status: Home / Self Care | Payer: Self-pay | Source: Home / Self Care | Attending: Family Medicine

## 2016-03-03 ENCOUNTER — Encounter (HOSPITAL_COMMUNITY): Payer: Self-pay | Admitting: *Deleted

## 2016-03-03 HISTORY — PX: CYSTOSCOPY/RETROGRADE/URETEROSCOPY: SHX5316

## 2016-03-03 LAB — CBC WITH DIFFERENTIAL/PLATELET
BASOS ABS: 0 10*3/uL (ref 0.0–0.1)
Basophils Relative: 0 %
EOS PCT: 2 %
Eosinophils Absolute: 0.1 10*3/uL (ref 0.0–0.7)
HCT: 37.7 % — ABNORMAL LOW (ref 39.0–52.0)
Hemoglobin: 13.1 g/dL (ref 13.0–17.0)
LYMPHS PCT: 20 %
Lymphs Abs: 1.3 10*3/uL (ref 0.7–4.0)
MCH: 26.8 pg (ref 26.0–34.0)
MCHC: 34.7 g/dL (ref 30.0–36.0)
MCV: 77.1 fL — AB (ref 78.0–100.0)
MONO ABS: 0.5 10*3/uL (ref 0.1–1.0)
Monocytes Relative: 8 %
Neutro Abs: 4.4 10*3/uL (ref 1.7–7.7)
Neutrophils Relative %: 70 %
PLATELETS: 309 10*3/uL (ref 150–400)
RBC: 4.89 MIL/uL (ref 4.22–5.81)
RDW: 15 % (ref 11.5–15.5)
WBC: 6.4 10*3/uL (ref 4.0–10.5)

## 2016-03-03 LAB — BASIC METABOLIC PANEL
Anion gap: 9 (ref 5–15)
BUN: 10 mg/dL (ref 6–20)
CALCIUM: 9.2 mg/dL (ref 8.9–10.3)
CO2: 24 mmol/L (ref 22–32)
Chloride: 103 mmol/L (ref 101–111)
Creatinine, Ser: 0.76 mg/dL (ref 0.61–1.24)
GFR calc Af Amer: >60 mL/min (ref >60)
GLUCOSE: 88 mg/dL (ref 65–99)
POTASSIUM: 3.5 mmol/L (ref 3.5–5.1)
Sodium: 136 mmol/L (ref 135–145)

## 2016-03-03 SURGERY — CYSTOSCOPY/RETROGRADE/URETEROSCOPY
Anesthesia: General | Site: Ureter | Laterality: Left

## 2016-03-03 MED ORDER — MIDAZOLAM HCL 5 MG/5ML IJ SOLN
INTRAMUSCULAR | Status: DC | PRN
  Administered 2016-03-03: 2 mg via INTRAVENOUS

## 2016-03-03 MED ORDER — FENTANYL CITRATE (PF) 100 MCG/2ML IJ SOLN
INTRAMUSCULAR | Status: DC
Start: 2016-03-03 — End: 2016-03-03
  Filled 2016-03-03: qty 2

## 2016-03-03 MED ORDER — MEPERIDINE HCL 50 MG/ML IJ SOLN: 6.2500 mg | INTRAMUSCULAR | Status: DC | PRN

## 2016-03-03 MED ORDER — LACTATED RINGERS IV SOLN: INTRAVENOUS | Status: DC

## 2016-03-03 MED ORDER — HYDROCODONE-ACETAMINOPHEN 5-325 MG PO TABS: 1.0000 | ORAL_TABLET | Freq: Four times a day (QID) | ORAL | 0 refills | Status: DC | PRN

## 2016-03-03 MED ORDER — CEFTRIAXONE SODIUM 2 G IJ SOLR
2.0000 g | Freq: Once | INTRAMUSCULAR | Status: AC
Start: 2016-03-03 — End: 2016-03-03
  Administered 2016-03-03: 2 g via INTRAVENOUS
  Filled 2016-03-03 (×2): qty 2

## 2016-03-03 MED ORDER — FENTANYL CITRATE (PF) 100 MCG/2ML IJ SOLN
INTRAMUSCULAR | Status: DC | PRN
Start: 2016-03-03 — End: 2016-03-03
  Administered 2016-03-03 (×6): 50 ug via INTRAVENOUS

## 2016-03-03 MED ORDER — BELLADONNA ALKALOIDS-OPIUM 16.2-60 MG RE SUPP
RECTAL | Status: AC
  Filled 2016-03-03: qty 1

## 2016-03-03 MED ORDER — FENTANYL CITRATE (PF) 100 MCG/2ML IJ SOLN
25.0000 ug | INTRAMUSCULAR | Status: DC | PRN
  Administered 2016-03-03 (×3): 50 ug via INTRAVENOUS

## 2016-03-03 MED ORDER — FLUCONAZOLE 200 MG PO TABS: 200.0000 mg | ORAL_TABLET | Freq: Every day | ORAL | 0 refills | Status: AC

## 2016-03-03 MED ORDER — FLUCONAZOLE 200 MG PO TABS: 200.0000 mg | ORAL_TABLET | Freq: Every day | ORAL | 0 refills | Status: DC

## 2016-03-03 MED ORDER — LIDOCAINE HCL 2 % EX GEL
CUTANEOUS | Status: AC
  Filled 2016-03-03: qty 5

## 2016-03-03 MED ORDER — LIDOCAINE HCL (CARDIAC) 10 MG/ML IV SOLN
INTRAVENOUS | Status: DC | PRN
  Administered 2016-03-03: 100 mg via INTRAVENOUS

## 2016-03-03 MED ORDER — ONDANSETRON HCL 4 MG/2ML IJ SOLN
INTRAMUSCULAR | Status: DC | PRN
  Administered 2016-03-03: 4 mg via INTRAVENOUS

## 2016-03-03 MED ORDER — ACETAMINOPHEN 10 MG/ML IV SOLN
INTRAVENOUS | Status: DC | PRN
  Administered 2016-03-03: 1000 mg via INTRAVENOUS

## 2016-03-03 MED ORDER — METOCLOPRAMIDE HCL 5 MG/ML IJ SOLN: 10.0000 mg | Freq: Once | INTRAMUSCULAR | Status: DC | PRN

## 2016-03-03 MED ORDER — LACTATED RINGERS IV SOLN
INTRAVENOUS | Status: DC | PRN
  Administered 2016-03-03 (×2): via INTRAVENOUS

## 2016-03-03 MED ORDER — IOHEXOL 300 MG/ML  SOLN
INTRAMUSCULAR | Status: DC | PRN
  Administered 2016-03-03: 60 mL

## 2016-03-03 MED ORDER — DEXAMETHASONE SODIUM PHOSPHATE 10 MG/ML IJ SOLN
INTRAMUSCULAR | Status: DC | PRN
  Administered 2016-03-03: 10 mg via INTRAVENOUS

## 2016-03-03 MED ORDER — FENTANYL CITRATE (PF) 100 MCG/2ML IJ SOLN
INTRAMUSCULAR | Status: AC
  Administered 2016-03-03: 50 ug via INTRAVENOUS
  Filled 2016-03-03: qty 2

## 2016-03-03 MED ORDER — 0.9 % SODIUM CHLORIDE (POUR BTL) OPTIME
TOPICAL | Status: DC | PRN
  Administered 2016-03-03: 2000 mL

## 2016-03-03 MED ORDER — IOPAMIDOL (ISOVUE-300) INJECTION 61%
INTRAVENOUS | Status: AC
  Filled 2016-03-03: qty 50

## 2016-03-03 MED ORDER — BELLADONNA ALKALOIDS-OPIUM 16.2-60 MG RE SUPP
RECTAL | Status: DC | PRN
  Administered 2016-03-03: 1 via RECTAL

## 2016-03-03 MED ORDER — PROPOFOL 10 MG/ML IV BOLUS
INTRAVENOUS | Status: DC | PRN
  Administered 2016-03-03: 200 mg via INTRAVENOUS

## 2016-03-03 SURGICAL SUPPLY — 14 items
BAG URO CATCHER STRL LF (MISCELLANEOUS) ×3 IMPLANT
BASKET ZERO TIP NITINOL 2.4FR (BASKET) IMPLANT
BSKT STON RTRVL ZERO TP 2.4FR (BASKET)
CATH FOLEY 2WAY  5CC 16FR SIL (CATHETERS) ×2
CATH FOLEY 2WAY 5CC 16FR SIL (CATHETERS) IMPLANT
CATH URET 5FR 28IN OPEN ENDED (CATHETERS) ×2 IMPLANT
GLOVE BIOGEL M STRL SZ7.5 (GLOVE) ×3 IMPLANT
GOWN STRL REUS W/TWL XL LVL3 (GOWN DISPOSABLE) ×3 IMPLANT
GUIDEWIRE ANG ZIPWIRE 038X150 (WIRE) ×2 IMPLANT
GUIDEWIRE STR DUAL SENSOR (WIRE) IMPLANT
PACK CYSTO (CUSTOM PROCEDURE TRAY) ×3 IMPLANT
SYR 20CC LL (SYRINGE) ×3 IMPLANT
SYR 30ML LL (SYRINGE) ×2 IMPLANT
WIRE COONS/BENSON .038X145CM (WIRE) ×3 IMPLANT
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
## 2016-03-03 NOTE — Transfer of Care (Signed)
Immediate Anesthesia Transfer of Care Note  Patient: Carlos Lawrence  Procedure(s) Performed: Procedure(s): CYSTOSCOPY AND CYSTOGRAM/LEFT RETROGRADE URETEROGRAM/ LEFT DIAGNOSTIC URETEROSCOPY AND INSERTION OF LEFT STENT (Left)  Patient Location: PACU  Anesthesia Type:General  Level of Consciousness: awake, alert , oriented and patient cooperative  Airway & Oxygen Therapy: Patient Spontanous Breathing and Patient connected to face mask oxygen  Post-op Assessment: Report given to RN, Post -op Vital signs reviewed and stable and Patient moving all extremities X 4  Post vital signs: stable  Last Vitals:  Vitals:   03/03/16 0534 03/03/16 1155  BP: 134/85 (!) (P) 153/85  Pulse: 70 (!) (P) 58  Resp: 16 (P) 14  Temp: 36.8 C (P) 36.6 C    Last Pain:  Vitals:   03/03/16 1155  TempSrc:   PainSc: (P) Asleep      Patients Stated Pain Goal: 3 ([id number])  Complications: No apparent anesthesia complications

## 2016-03-03 NOTE — Anesthesia Postprocedure Evaluation (Signed)
Anesthesia Post Note  Patient: BODE BRYANT  Procedure(s) Performed: Procedure(s) (LRB): CYSTOSCOPY AND CYSTOGRAM/LEFT RETROGRADE URETEROGRAM/ LEFT DIAGNOSTIC URETEROSCOPY AND INSERTION OF LEFT STENT (Left)  Patient location during evaluation: PACU Anesthesia Type: General Level of consciousness: awake and alert Pain management: pain level controlled Vital Signs Assessment: post-procedure vital signs reviewed and stable Respiratory status: spontaneous breathing, nonlabored ventilation, respiratory function stable and patient connected to nasal cannula oxygen Cardiovascular status: blood pressure returned to baseline and stable Postop Assessment: no signs of nausea or vomiting Anesthetic complications: no    Last Vitals:  Vitals:   03/03/16 1230 03/03/16 1249  BP: (!) 134/91 131/89  Pulse: (!) 52 (!) 53  Resp: 12 14  Temp: 36.7 C 37.1 C    Last Pain:  Vitals:   03/03/16 1357  TempSrc:   PainSc: 9                  Montez Hageman
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
## 2016-03-03 NOTE — Care Management Note (Signed)
Case Management Note  Patient Details  Name: Carlos Lawrence MRN: [id number] Date of Birth: 1985/11/19  Subjective/Objective: Provided w/pcp listing-encouraged [place], community resources,$[facility] med list.                   Action/Plan:d/c home.   Expected Discharge Date:   (unknown)               Expected Discharge Plan:  Home/Self Care  In-House Referral:     Discharge planning Services  CM Consult, [facility], Medication Assistance  Post Acute Care Choice:    Choice offered to:     DME Arranged:    DME Agency:     HH Arranged:    HH Agency:     Status of Service:  In process, will continue to follow  If discussed at Long Length of Stay Meetings, dates discussed:    Additional Comments:  Dessa Phi, RN 03/03/2016, 2:23 PM
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
## 2016-03-03 NOTE — Anesthesia Procedure Notes (Signed)
Procedure Name: LMA Insertion Date/Time: 03/03/2016 10:39 AM Performed by: Lissa Morales Pre-anesthesia Checklist: Patient identified, Emergency Drugs available, Suction available and Patient being monitored Patient Re-evaluated:Patient Re-evaluated prior to inductionOxygen Delivery Method: Circle system utilized Preoxygenation: Pre-oxygenation with 100% oxygen Intubation Type: IV induction Ventilation: Mask ventilation without difficulty LMA: LMA with gastric port inserted LMA Size: 5.0 Tube type: Oral (gastric tube  thru  LMA port to stomach to decompress) Number of attempts: 1 Placement Confirmation: positive ETCO2 Tube secured with: Tape Dental Injury: Teeth and Oropharynx as per pre-operative assessment

## 2016-03-03 NOTE — Discharge Instructions (Signed)
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
## 2016-03-05 LAB — CULTURE, BLOOD (ROUTINE X 2)
CULTURE: NO GROWTH
CULTURE: NO GROWTH

## 2016-03-07 ENCOUNTER — Encounter (HOSPITAL_COMMUNITY): Payer: Self-pay | Admitting: Urology

## 2016-03-24 ENCOUNTER — Emergency Department (HOSPITAL_COMMUNITY)
Admission: EM | Admit: 2016-03-24 | Discharge: 2016-03-24 | Disposition: A | Discharge status: Home / Self Care | Payer: Self-pay | Attending: Emergency Medicine | Admitting: Emergency Medicine

## 2016-03-24 ENCOUNTER — Encounter (HOSPITAL_COMMUNITY): Payer: Self-pay | Admitting: Emergency Medicine

## 2016-03-24 ENCOUNTER — Emergency Department (HOSPITAL_COMMUNITY): Payer: Self-pay

## 2016-03-24 DIAGNOSIS — R109 Unspecified abdominal pain: Secondary | ICD-10-CM | POA: Insufficient documentation

## 2016-03-24 DIAGNOSIS — I10 Essential (primary) hypertension: Secondary | ICD-10-CM | POA: Insufficient documentation

## 2016-03-24 DIAGNOSIS — J45909 Unspecified asthma, uncomplicated: Secondary | ICD-10-CM | POA: Insufficient documentation

## 2016-03-24 DIAGNOSIS — F17210 Nicotine dependence, cigarettes, uncomplicated: Secondary | ICD-10-CM | POA: Insufficient documentation

## 2016-03-24 LAB — CBC WITH DIFFERENTIAL/PLATELET
BASOS PCT: 0 %
Basophils Absolute: 0 10*3/uL (ref 0.0–0.1)
EOS ABS: 0.1 10*3/uL (ref 0.0–0.7)
EOS PCT: 1 %
HCT: 38.1 % — ABNORMAL LOW (ref 39.0–52.0)
Hemoglobin: 13.3 g/dL (ref 13.0–17.0)
LYMPHS ABS: 1.9 10*3/uL (ref 0.7–4.0)
Lymphocytes Relative: 18 %
MCH: 27.4 pg (ref 26.0–34.0)
MCHC: 34.9 g/dL (ref 30.0–36.0)
MCV: 78.4 fL (ref 78.0–100.0)
MONOS PCT: 6 %
Monocytes Absolute: 0.6 10*3/uL (ref 0.1–1.0)
Neutro Abs: 7.6 10*3/uL (ref 1.7–7.7)
Neutrophils Relative %: 75 %
PLATELETS: 299 10*3/uL (ref 150–400)
RBC: 4.86 MIL/uL (ref 4.22–5.81)
RDW: 15.4 % (ref 11.5–15.5)
WBC: 10.1 10*3/uL (ref 4.0–10.5)

## 2016-03-24 LAB — COMPREHENSIVE METABOLIC PANEL
ALT: 10 U/L — ABNORMAL LOW (ref 17–63)
ANION GAP: 9 (ref 5–15)
AST: 23 U/L (ref 15–41)
Albumin: 3.7 g/dL (ref 3.5–5.0)
Alkaline Phosphatase: 62 U/L (ref 38–126)
BUN: 8 mg/dL (ref 6–20)
CHLORIDE: 107 mmol/L (ref 101–111)
CO2: 25 mmol/L (ref 22–32)
Calcium: 9.1 mg/dL (ref 8.9–10.3)
Creatinine, Ser: 0.99 mg/dL (ref 0.61–1.24)
GFR calc non Af Amer: >60 mL/min (ref >60)
Glucose, Bld: 88 mg/dL (ref 65–99)
POTASSIUM: 3.9 mmol/L (ref 3.5–5.1)
SODIUM: 141 mmol/L (ref 135–145)
Total Bilirubin: 0.5 mg/dL (ref 0.3–1.2)
Total Protein: 6.8 g/dL (ref 6.5–8.1)

## 2016-03-24 LAB — URINE MICROSCOPIC-ADD ON

## 2016-03-24 LAB — URINALYSIS, ROUTINE W REFLEX MICROSCOPIC
BILIRUBIN URINE: NEGATIVE
Glucose, UA: NEGATIVE mg/dL
KETONES UR: NEGATIVE mg/dL
Nitrite: NEGATIVE
PH: 7 (ref 5.0–8.0)
Protein, ur: NEGATIVE mg/dL
SPECIFIC GRAVITY, URINE: 1.016 (ref 1.005–1.030)

## 2016-03-24 LAB — I-STAT CG4 LACTIC ACID, ED: LACTIC ACID, VENOUS: 1.14 mmol/L (ref 0.5–1.9)

## 2016-03-24 MED ORDER — KETOROLAC TROMETHAMINE 30 MG/ML IJ SOLN
30.0000 mg | Freq: Once | INTRAMUSCULAR | Status: AC
  Administered 2016-03-24: 30 mg via INTRAVENOUS
  Filled 2016-03-24: qty 1

## 2016-03-24 MED ORDER — HYDROMORPHONE HCL 2 MG/ML IJ SOLN
1.0000 mg | Freq: Once | INTRAMUSCULAR | Status: AC
  Administered 2016-03-24: 1 mg via INTRAVENOUS
  Filled 2016-03-24: qty 1

## 2016-03-24 MED ORDER — CEPHALEXIN 500 MG PO CAPS: 500.0000 mg | ORAL_CAPSULE | Freq: Two times a day (BID) | ORAL | 0 refills | Status: DC

## 2016-03-24 MED ORDER — OXYCODONE-ACETAMINOPHEN 5-325 MG PO TABS: 1.0000 | ORAL_TABLET | ORAL | 0 refills | Status: DC | PRN

## 2016-03-24 MED ORDER — ONDANSETRON HCL 4 MG/2ML IJ SOLN
4.0000 mg | Freq: Once | INTRAMUSCULAR | Status: AC
  Administered 2016-03-24: 4 mg via INTRAVENOUS
  Filled 2016-03-24: qty 2
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
## 2016-03-24 NOTE — Discharge Instructions (Signed)
Take the prescribed medication as directed.  Do not drive while taking pain medication. Follow-up with Dr. [name] when scheduled. Return to the ED for new or worsening symptoms.
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
## 2016-03-25 LAB — URINE CULTURE
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
## 2016-04-11 IMAGING — CR DG CHEST 1V PORT
1 series · 1 of 1 positions shown · non-contrast
Comparison: 04/15/2014

CLINICAL DATA: .  Atelectasis, exploratory laparotomy

EXAM:
PORTABLE CHEST - 1 VIEW

[AP]
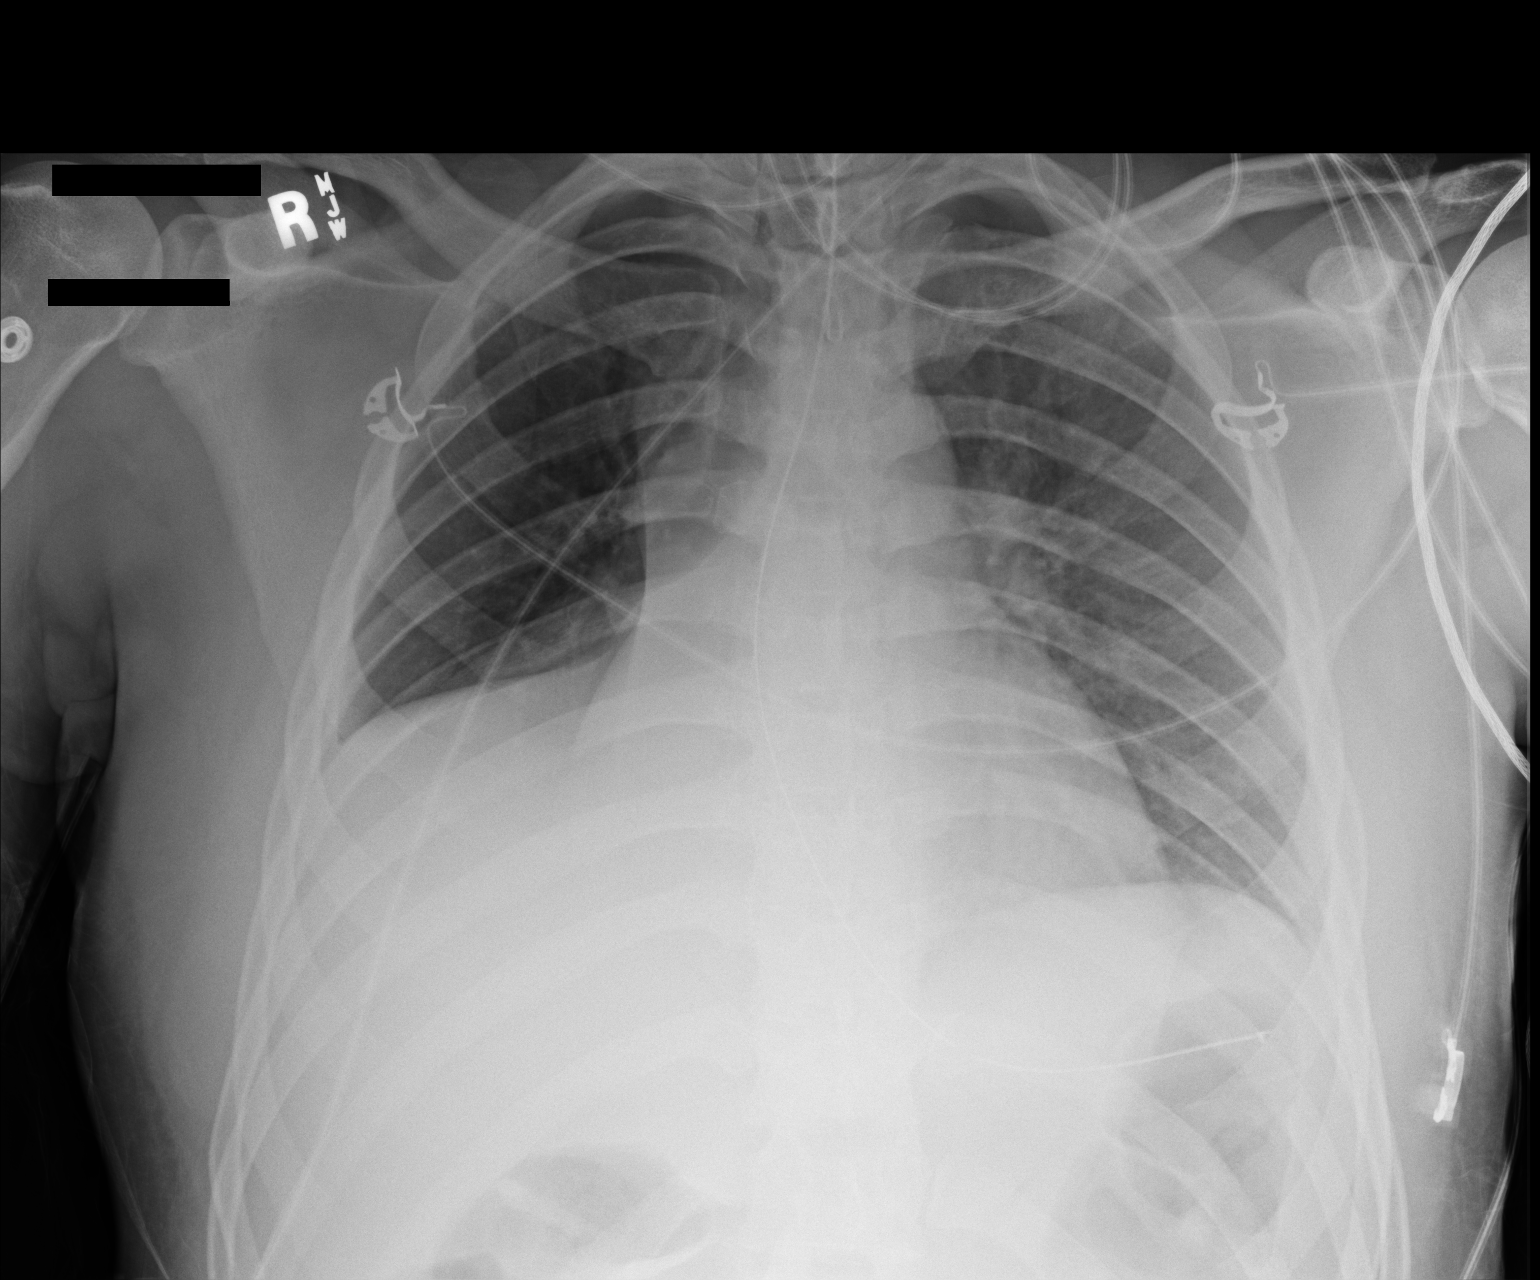

[1 of 1 positions shown; findings below may reference images not displayed]

FINDINGS: Cardiomediastinal silhouette is stable. Persistent right basilar
atelectasis. Endotracheal tube has been removed. No pneumothorax. No
pulmonary edema. Stable NG tube position.
IMPRESSION: Persistent right basilar atelectasis. Endotracheal tube has been
removed. No pneumothorax. No pulmonary edema.

## 2016-05-02 ENCOUNTER — Other Ambulatory Visit: Payer: Self-pay | Admitting: Urology

## 2016-05-02 ENCOUNTER — Encounter (HOSPITAL_BASED_OUTPATIENT_CLINIC_OR_DEPARTMENT_OTHER): Payer: Self-pay | Admitting: *Deleted

## 2016-05-02 NOTE — Progress Notes (Signed)
NPO AFTER MN.  ARRIVE AT 1030.  NEEDS ISTAT AND EKG.  PT TO CALL BACK W/ BP MEDICATION NAME AND GIVE HIM INSTRUCTIONS IF TO TAKE OR NOT AM DOS.
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
## 2016-05-04 ENCOUNTER — Ambulatory Visit (HOSPITAL_BASED_OUTPATIENT_CLINIC_OR_DEPARTMENT_OTHER): Payer: Self-pay | Admitting: Anesthesiology

## 2016-05-04 ENCOUNTER — Ambulatory Visit (HOSPITAL_BASED_OUTPATIENT_CLINIC_OR_DEPARTMENT_OTHER)
Admission: RE | Admit: 2016-05-04 | Discharge: 2016-05-04 | Disposition: A | Discharge status: Home / Self Care | Payer: Self-pay | Source: Ambulatory Visit | Attending: Urology | Admitting: Urology

## 2016-05-04 ENCOUNTER — Encounter (HOSPITAL_BASED_OUTPATIENT_CLINIC_OR_DEPARTMENT_OTHER): Payer: Self-pay

## 2016-05-04 ENCOUNTER — Encounter (HOSPITAL_BASED_OUTPATIENT_CLINIC_OR_DEPARTMENT_OTHER)
Admission: RE | Disposition: A | Discharge status: Home / Self Care | Payer: Self-pay | Source: Ambulatory Visit | Attending: Urology

## 2016-05-04 DIAGNOSIS — Z9889 Other specified postprocedural states: Secondary | ICD-10-CM | POA: Insufficient documentation

## 2016-05-04 DIAGNOSIS — Z791 Long term (current) use of non-steroidal anti-inflammatories (NSAID): Secondary | ICD-10-CM | POA: Insufficient documentation

## 2016-05-04 DIAGNOSIS — F17200 Nicotine dependence, unspecified, uncomplicated: Secondary | ICD-10-CM | POA: Insufficient documentation

## 2016-05-04 DIAGNOSIS — Z8051 Family history of malignant neoplasm of kidney: Secondary | ICD-10-CM | POA: Insufficient documentation

## 2016-05-04 DIAGNOSIS — Z79891 Long term (current) use of opiate analgesic: Secondary | ICD-10-CM | POA: Insufficient documentation

## 2016-05-04 DIAGNOSIS — Z79899 Other long term (current) drug therapy: Secondary | ICD-10-CM | POA: Insufficient documentation

## 2016-05-04 DIAGNOSIS — N1330 Unspecified hydronephrosis: Secondary | ICD-10-CM | POA: Insufficient documentation

## 2016-05-04 HISTORY — DX: Unspecified hydronephrosis: N13.30

## 2016-05-04 HISTORY — DX: Other specified postprocedural states: Z98.890

## 2016-05-04 HISTORY — DX: Personal history of other (healed) physical injury and trauma: Z87.828

## 2016-05-04 HISTORY — PX: CYSTOSCOPY W/ URETERAL STENT REMOVAL: SHX1430

## 2016-05-04 HISTORY — DX: Retained foreign body fragments, unspecified material: Z18.9

## 2016-05-04 LAB — POCT I-STAT, CHEM 8
BUN: 13 mg/dL (ref 6–20)
CALCIUM ION: 1.13 mmol/L — AB (ref 1.15–1.40)
CHLORIDE: 108 mmol/L (ref 101–111)
Creatinine, Ser: 0.9 mg/dL (ref 0.61–1.24)
Glucose, Bld: 93 mg/dL (ref 65–99)
HEMATOCRIT: 42 % (ref 39.0–52.0)
Hemoglobin: 14.3 g/dL (ref 13.0–17.0)
Potassium: 4.5 mmol/L (ref 3.5–5.1)
SODIUM: 140 mmol/L (ref 135–145)
TCO2: 26 mmol/L (ref 0–100)

## 2016-05-04 SURGERY — REMOVAL, STENT, URETER, CYSTOSCOPIC
Anesthesia: General | Site: Bladder | Laterality: Left

## 2016-05-04 MED ORDER — KETOROLAC TROMETHAMINE 30 MG/ML IJ SOLN
INTRAMUSCULAR | Status: AC
  Filled 2016-05-04: qty 1

## 2016-05-04 MED ORDER — PROPOFOL 10 MG/ML IV BOLUS
INTRAVENOUS | Status: DC | PRN
  Administered 2016-05-04: 250 mg via INTRAVENOUS
  Administered 2016-05-04: 30 mg via INTRAVENOUS

## 2016-05-04 MED ORDER — LIDOCAINE 2% (20 MG/ML) 5 ML SYRINGE
INTRAMUSCULAR | Status: DC | PRN
  Administered 2016-05-04: 60 mg via INTRAVENOUS

## 2016-05-04 MED ORDER — FLUCONAZOLE IN SODIUM CHLORIDE 200-0.9 MG/100ML-% IV SOLN
200.0000 mg | Freq: Once | INTRAVENOUS | Status: AC
  Administered 2016-05-04: 200 mg via INTRAVENOUS
  Filled 2016-05-04 (×2): qty 100

## 2016-05-04 MED ORDER — LIDOCAINE 2% (20 MG/ML) 5 ML SYRINGE
INTRAMUSCULAR | Status: AC
  Filled 2016-05-04: qty 5

## 2016-05-04 MED ORDER — FLUCONAZOLE 100 MG PO TABS: 100.0000 mg | ORAL_TABLET | Freq: Every day | ORAL | 0 refills | Status: DC

## 2016-05-04 MED ORDER — HYDROMORPHONE HCL 1 MG/ML IJ SOLN
0.2500 mg | INTRAMUSCULAR | Status: DC | PRN
  Administered 2016-05-04 (×3): 0.5 mg via INTRAVENOUS
  Filled 2016-05-04: qty 0.5

## 2016-05-04 MED ORDER — DEXAMETHASONE SODIUM PHOSPHATE 10 MG/ML IJ SOLN
INTRAMUSCULAR | Status: AC
  Filled 2016-05-04: qty 1

## 2016-05-04 MED ORDER — CIPROFLOXACIN IN D5W 400 MG/200ML IV SOLN
400.0000 mg | INTRAVENOUS | Status: AC
  Administered 2016-05-04: 400 mg via INTRAVENOUS
  Filled 2016-05-04: qty 200

## 2016-05-04 MED ORDER — CIPROFLOXACIN IN D5W 400 MG/200ML IV SOLN
INTRAVENOUS | Status: AC
  Filled 2016-05-04: qty 200

## 2016-05-04 MED ORDER — LACTATED RINGERS IV SOLN
INTRAVENOUS | Status: DC
  Administered 2016-05-04: 12:00:00 via INTRAVENOUS
  Filled 2016-05-04: qty 1000

## 2016-05-04 MED ORDER — ONDANSETRON HCL 4 MG/2ML IJ SOLN
INTRAMUSCULAR | Status: DC | PRN
  Administered 2016-05-04: 4 mg via INTRAVENOUS

## 2016-05-04 MED ORDER — ONDANSETRON HCL 4 MG/2ML IJ SOLN
INTRAMUSCULAR | Status: AC
  Filled 2016-05-04: qty 2

## 2016-05-04 MED ORDER — CEFAZOLIN SODIUM-DEXTROSE 2-4 GM/100ML-% IV SOLN
INTRAVENOUS | Status: AC
  Filled 2016-05-04: qty 100

## 2016-05-04 MED ORDER — HYDROMORPHONE HCL 2 MG/ML IJ SOLN
INTRAMUSCULAR | Status: AC
  Filled 2016-05-04: qty 1

## 2016-05-04 MED ORDER — ARTIFICIAL TEARS OP OINT
TOPICAL_OINTMENT | OPHTHALMIC | Status: AC
  Filled 2016-05-04: qty 3.5

## 2016-05-04 MED ORDER — DEXAMETHASONE SODIUM PHOSPHATE 4 MG/ML IJ SOLN
INTRAMUSCULAR | Status: DC | PRN
  Administered 2016-05-04: 10 mg via INTRAVENOUS

## 2016-05-04 MED ORDER — CEFAZOLIN SODIUM-DEXTROSE 2-4 GM/100ML-% IV SOLN
2.0000 g | INTRAVENOUS | Status: AC
  Administered 2016-05-04: 2 g via INTRAVENOUS
  Filled 2016-05-04: qty 100

## 2016-05-04 MED ORDER — OXYCODONE-ACETAMINOPHEN 5-325 MG PO TABS
1.0000 | ORAL_TABLET | Freq: Once | ORAL | Status: DC
  Filled 2016-05-04: qty 1

## 2016-05-04 MED ORDER — MIDAZOLAM HCL 5 MG/5ML IJ SOLN
INTRAMUSCULAR | Status: DC | PRN
  Administered 2016-05-04: 2 mg via INTRAVENOUS

## 2016-05-04 MED ORDER — KETOROLAC TROMETHAMINE 30 MG/ML IJ SOLN
INTRAMUSCULAR | Status: DC | PRN
  Administered 2016-05-04: 30 mg via INTRAVENOUS

## 2016-05-04 MED ORDER — FENTANYL CITRATE (PF) 100 MCG/2ML IJ SOLN
INTRAMUSCULAR | Status: DC | PRN
  Administered 2016-05-04 (×2): 50 ug via INTRAVENOUS

## 2016-05-04 MED ORDER — MIDAZOLAM HCL 2 MG/2ML IJ SOLN
INTRAMUSCULAR | Status: AC
  Filled 2016-05-04: qty 2

## 2016-05-04 MED ORDER — PROMETHAZINE HCL 25 MG/ML IJ SOLN
6.2500 mg | INTRAMUSCULAR | Status: DC | PRN
  Filled 2016-05-04: qty 1

## 2016-05-04 MED ORDER — PROPOFOL 10 MG/ML IV BOLUS
INTRAVENOUS | Status: AC
  Filled 2016-05-04: qty 40

## 2016-05-04 MED ORDER — FENTANYL CITRATE (PF) 100 MCG/2ML IJ SOLN
INTRAMUSCULAR | Status: AC
  Filled 2016-05-04: qty 2

## 2016-05-04 SURGICAL SUPPLY — 5 items
GLOVE BIOGEL PI IND STRL 7.0 (GLOVE) IMPLANT
GLOVE BIOGEL PI INDICATOR 7.0 (GLOVE) ×4
GLOVE SS BIOGEL STRL SZ 7 (GLOVE) IMPLANT
GLOVE SUPERSENSE BIOGEL SZ 7 (GLOVE) ×2
PACK CYSTOSCOPY (CUSTOM PROCEDURE TRAY) ×2 IMPLANT

## 2016-05-04 NOTE — Discharge Instructions (Signed)
[facility] [phone] Post Ureteroscopy With or Without Stent Instructions  Definitions:  Ureter: The duct that transports urine from the kidney to the bladder. Stent:   A plastic hollow tube that is placed into the ureter, from the kidney to the bladder to prevent the ureter from swelling shut.  GENERAL INSTRUCTIONS:  Despite the fact that no skin incisions were used, the area around the ureter and bladder is raw and irritated. The stent is a foreign body which will further irritate the bladder wall. This irritation is manifested by increased frequency of urination, both day and night, and by an increase in the urge to urinate. In some, the urge to urinate is present almost always. Sometimes the urge is strong enough that you may not be able to stop yourself from urinating. The only real cure is to remove the stent and then give time for the bladder wall to heal which can't be done until the danger of the ureter swelling shut has passed, which varies.  You may see some blood in your urine while the stent is in place and a few days afterwards. Do not be alarmed, even if the urine was clear for a while. Get off your feet and drink lots of fluids until clearing occurs. If you start to pass clots or don't improve, call us.  DIET: You may return to your normal diet immediately. Because of the raw surface of your bladder, alcohol, spicy foods, acid type foods and drinks with caffeine may cause irritation or frequency and should be used in moderation. To keep your urine flowing freely and to avoid constipation, drink plenty of fluids during the day ( 8-10 glasses ). Tip: Avoid cranberry juice because it is very acidic.  ACTIVITY: Your physical activity doesn't need to be restricted. However, if you are very active, you may see some blood in your urine. We suggest that you reduce your activity under these circumstances until the bleeding has stopped.  BOWELS: It is important to  keep your bowels regular during the postoperative period. Straining with bowel movements can cause bleeding. A bowel movement every other day is reasonable. Use a mild laxative if needed, such as Milk of Magnesia 2-3 tablespoons, or 2 Dulcolax tablets. Call if you continue to have problems. If you have been taking narcotics for pain, before, during or after your surgery, you may be constipated. Take a laxative if necessary.   MEDICATION: You should resume your pre-surgery medications unless told not to. In addition you will often be given an antibiotic to prevent infection. These should be taken as prescribed until the bottles are finished unless you are having an unusual reaction to one of the drugs.  PROBLEMS YOU SHOULD REPORT TO [place]:  Fevers over 100.5 Fahrenheit.  Heavy bleeding, or clots ( See above notes about blood in urine ).  Inability to urinate.  Drug reactions ( hives, rash, nausea, vomiting, diarrhea ).  Severe burning or pain with urination that is not improving.  FOLLOW-UP: You will need a follow-up appointment to monitor your progress. Call for this appointment at the number listed above. Usually the first appointment will be about three to fourteen days after your surgery.     I have reviewed discharge instructions in detail with the patient. They will follow-up with me or their physician as scheduled. My nurse will also be calling the patients as per protocol.    Post Anesthesia Home Care Instructions  Activity: Get plenty of rest for the remainder  of the day. A responsible adult should stay with you for 24 hours following the procedure.  For the next 24 hours, DO NOT: -Drive a car -Paediatric nurse -Drink alcoholic beverages -Take any medication unless instructed by your physician -Make any legal decisions or sign important papers.  Meals: Start with liquid foods such as gelatin or soup. Progress to regular foods as tolerated. Avoid greasy, spicy, heavy  foods. If nausea and/or vomiting occur, drink only clear liquids until the nausea and/or vomiting subsides. Call your physician if vomiting continues.  Special Instructions/Symptoms: Your throat may feel dry or sore from the anesthesia or the breathing tube placed in your throat during surgery. If this causes discomfort, gargle with warm salt water. The discomfort should disappear within 24 hours.  If you had a scopolamine patch placed behind your ear for the management of post- operative nausea and/or vomiting:  1. The medication in the patch is effective for 72 hours, after which it should be removed.  Wrap patch in a tissue and discard in the trash. Wash hands thoroughly with soap and water. 2. You may remove the patch earlier than 72 hours if you experience unpleasant side effects which may include dry mouth, dizziness or visual disturbances. 3. Avoid touching the patch. Wash your hands with soap and water after contact with the patch.

## 2016-05-04 NOTE — Anesthesia Preprocedure Evaluation (Addendum)
Anesthesia Evaluation  Patient identified by MRN, date of birth, ID band Patient awake    Reviewed: Allergy & Precautions, NPO status , Patient's Chart, lab work & pertinent test results  Airway Mallampati: II  TM Distance: >3 FB     Dental   Pulmonary asthma , Current Smoker,    breath sounds clear to auscultation       Cardiovascular hypertension, + Peripheral Vascular Disease   Rhythm:Regular Rate:Normal     Neuro/Psych    GI/Hepatic Neg liver ROS,   Endo/Other    Renal/GU Renal disease     Musculoskeletal   Abdominal   Peds  Hematology   Anesthesia Other Findings   Reproductive/Obstetrics                             Anesthesia Physical Anesthesia Plan  ASA: III  Anesthesia Plan: General   Post-op Pain Management:    Induction: Intravenous  Airway Management Planned: LMA  Additional Equipment:   Intra-op Plan:   Post-operative Plan: Extubation in OR  Informed Consent:   Plan Discussed with: CRNA and Anesthesiologist  Anesthesia Plan Comments:         Anesthesia Quick Evaluation

## 2016-05-04 NOTE — Interval H&P Note (Signed)
History and Physical Interval Note:  05/04/2016 11:48 AM  Carlos Lawrence  has presented today for surgery, with the diagnosis of LEFT HYDRONEPHROSIS  The various methods of treatment have been discussed with the patient and family. After consideration of risks, benefits and other options for treatment, the patient has consented to  Procedure(s): CYSTOSCOPY WITH STENT REMOVAL (Left) as a surgical intervention .  The patient's history has been reviewed, patient examined, no change in status, stable for surgery.  I have reviewed the patient's chart and labs.  Questions were answered to the patient's satisfaction.     Sylvia Kondracki A

## 2016-05-04 NOTE — Op Note (Signed)
Preoperative diagnosis: Left hydronephrosis and left ureteral stent Postoperative diagnosis: Left hydronephrosis and left ureteral stent Surgery: Cystoscopy and removal of left ureteral stent Surgeon: Dr. [name] Marlyn Tondreau  The patient has the above diagnoses and consented to the above procedure. The patient clinically was not infected. While the patient was being put to sleep I quits incidentally had just cut back his urine culture that demonstrated  Yeast. He was not clinically infected.  Recognizing I could postpone the surgery and wake the patient up I felt that it was best to proceed. I asked for 200 mg IV of fluconazole after speaking to pharmacy. He had been given preoperative antibiotics  I used a 23 French cystoscope was utilized. Penile and bulbar urethra normal. The prostatic urethra was a bit short. But the bladder neck looked competent. 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 He did have rigidity at the bladder neck region and it was mild to modest in severity  Once again the bladder looked completely normal. There was no cystitis. There was mucus and probably [place] I attached to the stent but otherwise there was no cystitis. He still had the edema around the stent at the level of the ureteral orifice as he did before  Using a rigid grasping forceps the stent was easily removed. I had used fluoroscopy to identify the stent prior and also to assess its easy removal  I recessed cystoscoped the patient and there was no reflux of abnormal tissue or pus.  The patient is at significant risk of recurrent hydronephrosis requiring a percutaneous tube. He will be warned about fever. I will send him home on Diflucan for another week. 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

## 2016-05-04 NOTE — Transfer of Care (Signed)
Last Vitals:  Vitals:   05/04/16 1104 05/04/16 1246  BP: 125/83   Pulse: 89   Resp: 16   Temp: 37.2 C 36.6 C    Last Pain:  Vitals:   05/04/16 1120  TempSrc:   PainSc: 7       Patients Stated Pain Goal: 8 (05/04/16 1120)  Immediate Anesthesia Transfer of Care Note  Patient: Carlos Lawrence  Procedure(s) Performed: Procedure(s) (LRB): CYSTOSCOPY WITH STENT REMOVAL (Left)  Patient Location: PACU  Anesthesia Type: General  Level of Consciousness: awake, alert  and oriented  Airway & Oxygen Therapy: Patient Spontanous Breathing and Patient connected to nasal cannula oxygen  Post-op Assessment: Report given to PACU RN and Post -op Vital signs reviewed and stable  Post vital signs: Reviewed and stable  Complications: No apparent anesthesia complications

## 2016-05-04 NOTE — Interval H&P Note (Signed)
History and Physical Interval Note:  05/04/2016 11:48 AM  Carlos Lawrence  has presented today for surgery, with the diagnosis of LEFT HYDRONEPHROSIS  The various methods of treatment have been discussed with the patient and family. After consideration of risks, benefits and other options for treatment, the patient has consented to  Procedure(s): CYSTOSCOPY WITH STENT REMOVAL (Left) as a surgical intervention .  The patient's history has been reviewed, patient examined, no change in status, stable for surgery.  I have reviewed the patient's chart and labs.  Questions were answered to the patient's satisfaction.     [name] A

## 2016-05-04 NOTE — Anesthesia Procedure Notes (Signed)
Procedure Name: LMA Insertion Date/Time: 05/04/2016 12:12 PM Performed by: Belinda Block Pre-anesthesia Checklist: Patient identified, Emergency Drugs available, Suction available and Patient being monitored Patient Re-evaluated:Patient Re-evaluated prior to inductionOxygen Delivery Method: Circle system utilized Preoxygenation: Pre-oxygenation with 100% oxygen Intubation Type: IV induction Ventilation: Mask ventilation without difficulty LMA: LMA inserted LMA Size: 4.0 Number of attempts: 1 Airway Equipment and Method: Bite block Placement Confirmation: positive ETCO2 Tube secured with: Tape Dental Injury: Teeth and Oropharynx as per pre-operative assessment

## 2016-05-05 ENCOUNTER — Encounter (HOSPITAL_BASED_OUTPATIENT_CLINIC_OR_DEPARTMENT_OTHER): Payer: Self-pay | Admitting: Urology

## 2016-05-05 NOTE — Anesthesia Postprocedure Evaluation (Signed)
Anesthesia Post Note  Patient: Carlos Lawrence  Procedure(s) Performed: Procedure(s) (LRB): CYSTOSCOPY WITH STENT REMOVAL (Left)  Patient location during evaluation: PACU Anesthesia Type: General Level of consciousness: awake Pain management: pain level controlled Respiratory status: spontaneous breathing Cardiovascular status: stable Anesthetic complications: no       Last Vitals:  Vitals:   05/04/16 1330 05/04/16 1409  BP: (!) 137/93 (!) 148/95  Pulse: (!) 50 (!) 51  Resp: 12 16  Temp:  36.9 C    Last Pain:  Vitals:   05/05/16 1008  TempSrc:   PainSc: 4                  Modestine Scherzinger

## 2016-05-27 ENCOUNTER — Encounter (HOSPITAL_COMMUNITY): Payer: Self-pay | Admitting: Emergency Medicine

## 2016-05-27 ENCOUNTER — Emergency Department (HOSPITAL_COMMUNITY)
Admission: EM | Admit: 2016-05-27 | Discharge: 2016-05-27 | Disposition: A | Discharge status: Home / Self Care | Payer: Self-pay | Attending: Emergency Medicine | Admitting: Emergency Medicine

## 2016-05-27 DIAGNOSIS — R220 Localized swelling, mass and lump, head: Secondary | ICD-10-CM | POA: Insufficient documentation

## 2016-05-27 DIAGNOSIS — K0889 Other specified disorders of teeth and supporting structures: Secondary | ICD-10-CM | POA: Insufficient documentation

## 2016-05-27 DIAGNOSIS — I10 Essential (primary) hypertension: Secondary | ICD-10-CM | POA: Insufficient documentation

## 2016-05-27 DIAGNOSIS — F17210 Nicotine dependence, cigarettes, uncomplicated: Secondary | ICD-10-CM | POA: Insufficient documentation

## 2016-05-27 DIAGNOSIS — Z79899 Other long term (current) drug therapy: Secondary | ICD-10-CM | POA: Insufficient documentation

## 2016-05-27 DIAGNOSIS — J45909 Unspecified asthma, uncomplicated: Secondary | ICD-10-CM | POA: Insufficient documentation

## 2016-05-27 MED ORDER — HYDROCODONE-ACETAMINOPHEN 5-325 MG PO TABS: 2.0000 | ORAL_TABLET | Freq: Four times a day (QID) | ORAL | 0 refills | Status: DC | PRN

## 2016-05-27 MED ORDER — PENICILLIN V POTASSIUM 500 MG PO TABS: 500.0000 mg | ORAL_TABLET | Freq: Three times a day (TID) | ORAL | 0 refills | Status: DC

## 2016-05-27 MED ORDER — PENICILLIN V POTASSIUM 250 MG PO TABS
500.0000 mg | ORAL_TABLET | Freq: Once | ORAL | Status: AC
  Administered 2016-05-27: 500 mg via ORAL
  Filled 2016-05-27: qty 2

## 2016-05-27 MED ORDER — IBUPROFEN 800 MG PO TABS
800.0000 mg | ORAL_TABLET | Freq: Once | ORAL | Status: AC
  Administered 2016-05-27: 800 mg via ORAL
  Filled 2016-05-27: qty 1

## 2016-05-27 MED ORDER — IBUPROFEN 800 MG PO TABS: 800.0000 mg | ORAL_TABLET | Freq: Three times a day (TID) | ORAL | 0 refills | Status: DC

## 2016-05-27 NOTE — ED Triage Notes (Addendum)
Pt states he slipped on ice on his porch yesterday and fell.  Reports breaking L lower tooth when he fell.  C/o swelling to L side of face since this morning.  Denies any other injury from fall.  Pt ambulatory to triage.

## 2016-05-27 NOTE — Discharge Instructions (Signed)
Please call and follow up with dentist for further management of your dental injury and facial swelling.
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
## 2016-08-21 ENCOUNTER — Emergency Department (HOSPITAL_COMMUNITY): Payer: Self-pay

## 2016-08-21 ENCOUNTER — Inpatient Hospital Stay (HOSPITAL_COMMUNITY)
Admission: EM | Admit: 2016-08-21 | Discharge: 2016-08-23 | DRG: 917 | Discharge status: Against Medical Advice | Payer: Self-pay | Attending: Family Medicine | Admitting: Family Medicine

## 2016-08-21 DIAGNOSIS — T43621A Poisoning by amphetamines, accidental (unintentional), initial encounter: Secondary | ICD-10-CM | POA: Diagnosis present

## 2016-08-21 DIAGNOSIS — N179 Acute kidney failure, unspecified: Secondary | ICD-10-CM | POA: Diagnosis present

## 2016-08-21 DIAGNOSIS — T40604A Poisoning by unspecified narcotics, undetermined, initial encounter: Secondary | ICD-10-CM

## 2016-08-21 DIAGNOSIS — I10 Essential (primary) hypertension: Secondary | ICD-10-CM | POA: Diagnosis present

## 2016-08-21 DIAGNOSIS — G92 Toxic encephalopathy: Secondary | ICD-10-CM | POA: Diagnosis present

## 2016-08-21 DIAGNOSIS — T50901A Poisoning by unspecified drugs, medicaments and biological substances, accidental (unintentional), initial encounter: Secondary | ICD-10-CM

## 2016-08-21 DIAGNOSIS — Y92009 Unspecified place in unspecified non-institutional (private) residence as the place of occurrence of the external cause: Secondary | ICD-10-CM

## 2016-08-21 DIAGNOSIS — T402X1A Poisoning by other opioids, accidental (unintentional), initial encounter: Principal | ICD-10-CM | POA: Diagnosis present

## 2016-08-21 DIAGNOSIS — F1910 Other psychoactive substance abuse, uncomplicated: Secondary | ICD-10-CM | POA: Diagnosis present

## 2016-08-21 DIAGNOSIS — T424X1A Poisoning by benzodiazepines, accidental (unintentional), initial encounter: Secondary | ICD-10-CM | POA: Diagnosis present

## 2016-08-21 DIAGNOSIS — T40601A Poisoning by unspecified narcotics, accidental (unintentional), initial encounter: Secondary | ICD-10-CM | POA: Diagnosis present

## 2016-08-21 LAB — RAPID URINE DRUG SCREEN, HOSP PERFORMED
Amphetamines: POSITIVE — AB
BARBITURATES: NOT DETECTED
BENZODIAZEPINES: POSITIVE — AB
Cocaine: NOT DETECTED
Opiates: POSITIVE — AB
TETRAHYDROCANNABINOL: NOT DETECTED

## 2016-08-21 LAB — CBC
HEMATOCRIT: 37.9 % — AB (ref 39.0–52.0)
Hemoglobin: 13 g/dL (ref 13.0–17.0)
MCH: 27.2 pg (ref 26.0–34.0)
MCHC: 34.3 g/dL (ref 30.0–36.0)
MCV: 79.3 fL (ref 78.0–100.0)
PLATELETS: 281 10*3/uL (ref 150–400)
RBC: 4.78 MIL/uL (ref 4.22–5.81)
RDW: 15.5 % (ref 11.5–15.5)
WBC: 9.8 10*3/uL (ref 4.0–10.5)

## 2016-08-21 LAB — CBC WITH DIFFERENTIAL/PLATELET
Basophils Absolute: 0 10*3/uL (ref 0.0–0.1)
Basophils Relative: 0 %
EOS PCT: 4 %
Eosinophils Absolute: 0.3 10*3/uL (ref 0.0–0.7)
HEMATOCRIT: 38.8 % — AB (ref 39.0–52.0)
Hemoglobin: 13.4 g/dL (ref 13.0–17.0)
LYMPHS ABS: 2.7 10*3/uL (ref 0.7–4.0)
LYMPHS PCT: 29 %
MCH: 27.6 pg (ref 26.0–34.0)
MCHC: 34.5 g/dL (ref 30.0–36.0)
MCV: 80 fL (ref 78.0–100.0)
MONO ABS: 0.6 10*3/uL (ref 0.1–1.0)
Monocytes Relative: 6 %
Neutro Abs: 5.8 10*3/uL (ref 1.7–7.7)
Neutrophils Relative %: 61 %
PLATELETS: 302 10*3/uL (ref 150–400)
RBC: 4.85 MIL/uL (ref 4.22–5.81)
RDW: 15.5 % (ref 11.5–15.5)
WBC: 9.4 10*3/uL (ref 4.0–10.5)

## 2016-08-21 LAB — COMPREHENSIVE METABOLIC PANEL
ALT: 12 U/L — ABNORMAL LOW (ref 17–63)
AST: 37 U/L (ref 15–41)
Albumin: 3.9 g/dL (ref 3.5–5.0)
Alkaline Phosphatase: 70 U/L (ref 38–126)
Anion gap: 8 (ref 5–15)
BUN: 11 mg/dL (ref 6–20)
CHLORIDE: 104 mmol/L (ref 101–111)
CO2: 28 mmol/L (ref 22–32)
Calcium: 9 mg/dL (ref 8.9–10.3)
Creatinine, Ser: 0.99 mg/dL (ref 0.61–1.24)
GFR calc Af Amer: >60 mL/min (ref >60)
GFR calc non Af Amer: >60 mL/min (ref >60)
GLUCOSE: 124 mg/dL — AB (ref 65–99)
POTASSIUM: 4.6 mmol/L (ref 3.5–5.1)
Sodium: 140 mmol/L (ref 135–145)
Total Bilirubin: 0.8 mg/dL (ref 0.3–1.2)
Total Protein: 7.1 g/dL (ref 6.5–8.1)

## 2016-08-21 LAB — BASIC METABOLIC PANEL
Anion gap: 6 (ref 5–15)
BUN: 9 mg/dL (ref 6–20)
CALCIUM: 9 mg/dL (ref 8.9–10.3)
CO2: 25 mmol/L (ref 22–32)
CREATININE: 0.87 mg/dL (ref 0.61–1.24)
Chloride: 105 mmol/L (ref 101–111)
Glucose, Bld: 128 mg/dL — ABNORMAL HIGH (ref 65–99)
Potassium: 4.2 mmol/L (ref 3.5–5.1)
SODIUM: 136 mmol/L (ref 135–145)

## 2016-08-21 LAB — MAGNESIUM: Magnesium: 1.9 mg/dL (ref 1.7–2.4)

## 2016-08-21 LAB — HIV ANTIBODY (ROUTINE TESTING W REFLEX): HIV SCREEN 4TH GENERATION: NONREACTIVE

## 2016-08-21 LAB — ACETAMINOPHEN LEVEL: Acetaminophen (Tylenol), Serum: <10 ug/mL — ABNORMAL LOW (ref 10–30)

## 2016-08-21 LAB — MRSA PCR SCREENING: MRSA BY PCR: NEGATIVE

## 2016-08-21 LAB — CK: Total CK: 607 U/L — ABNORMAL HIGH (ref 49–397)

## 2016-08-21 LAB — PHOSPHORUS: PHOSPHORUS: 2.5 mg/dL (ref 2.5–4.6)

## 2016-08-21 LAB — ETHANOL

## 2016-08-21 LAB — SALICYLATE LEVEL: Salicylate Lvl: <7 mg/dL (ref 2.8–30.0)

## 2016-08-21 MED ORDER — SODIUM CHLORIDE 0.9 % IV SOLN: 0.2000 ug/kg/h | INTRAVENOUS | Status: DC

## 2016-08-21 MED ORDER — LORAZEPAM 2 MG/ML IJ SOLN: 1.0000 mg | INTRAMUSCULAR | Status: DC | PRN

## 2016-08-21 MED ORDER — DEXTROSE 5 % IV SOLN
1.0000 mg/h | INTRAVENOUS | Status: DC
Start: 2016-08-21 — End: 2016-08-21
  Administered 2016-08-21 (×3): 1 mg/h via INTRAVENOUS
  Filled 2016-08-21 (×3): qty 4

## 2016-08-21 MED ORDER — DEXMEDETOMIDINE HCL IN NACL 400 MCG/100ML IV SOLN
0.2000 ug/kg/h | INTRAVENOUS | Status: DC
Start: 2016-08-21 — End: 2016-08-21
  Administered 2016-08-21: 0.4 ug/kg/h via INTRAVENOUS
  Filled 2016-08-21: qty 100

## 2016-08-21 MED ORDER — SODIUM CHLORIDE 0.9 % IV SOLN
250.0000 mL | INTRAVENOUS | Status: DC | PRN
  Administered 2016-08-21: 1000 mL via INTRAVENOUS

## 2016-08-21 MED ORDER — NALOXONE HCL 2 MG/2ML IJ SOSY
PREFILLED_SYRINGE | INTRAMUSCULAR | Status: AC
  Filled 2016-08-21: qty 2

## 2016-08-21 MED ORDER — ENOXAPARIN SODIUM 40 MG/0.4ML ~~LOC~~ SOLN
40.0000 mg | SUBCUTANEOUS | Status: DC
Start: 2016-08-21 — End: 2016-08-23
  Administered 2016-08-22 – 2016-08-23 (×2): 40 mg via SUBCUTANEOUS
  Filled 2016-08-21 (×3): qty 0.4

## 2016-08-21 MED ORDER — SODIUM CHLORIDE 0.9 % IV BOLUS (SEPSIS)
500.0000 mL | Freq: Once | INTRAVENOUS | Status: AC
  Administered 2016-08-21: 1000 mL via INTRAVENOUS

## 2016-08-21 MED ORDER — ORAL CARE MOUTH RINSE
15.0000 mL | Freq: Two times a day (BID) | OROMUCOSAL | Status: DC
  Administered 2016-08-21 – 2016-08-23 (×2): 15 mL via OROMUCOSAL

## 2016-08-21 MED ORDER — ACETAMINOPHEN 325 MG PO TABS
650.0000 mg | ORAL_TABLET | ORAL | Status: DC | PRN
  Administered 2016-08-22: 650 mg via ORAL
  Filled 2016-08-21: qty 2

## 2016-08-21 MED ORDER — ADULT MULTIVITAMIN W/MINERALS CH
1.0000 | ORAL_TABLET | Freq: Every day | ORAL | Status: DC
  Administered 2016-08-21 – 2016-08-22 (×2): 1 via ORAL
  Filled 2016-08-21 (×3): qty 1

## 2016-08-21 MED ORDER — NALOXONE HCL 2 MG/2ML IJ SOSY
1.0000 mg | PREFILLED_SYRINGE | Freq: Once | INTRAMUSCULAR | Status: AC
  Administered 2016-08-21: 01:00:00 via INTRAVENOUS
  Filled 2016-08-21: qty 2

## 2016-08-21 MED ORDER — FOLIC ACID 1 MG PO TABS
1.0000 mg | ORAL_TABLET | Freq: Every day | ORAL | Status: DC
  Administered 2016-08-21 – 2016-08-23 (×3): 1 mg via ORAL
  Filled 2016-08-21 (×3): qty 1

## 2016-08-21 MED ORDER — ONDANSETRON HCL 4 MG/2ML IJ SOLN
4.0000 mg | INTRAMUSCULAR | Status: DC | PRN
  Administered 2016-08-21 – 2016-08-23 (×7): 4 mg via INTRAVENOUS
  Filled 2016-08-21 (×7): qty 2

## 2016-08-21 MED ORDER — IBUPROFEN 100 MG PO CHEW
400.0000 mg | CHEWABLE_TABLET | Freq: Three times a day (TID) | ORAL | Status: DC | PRN
  Administered 2016-08-21: 400 mg via ORAL
  Filled 2016-08-21 (×2): qty 4

## 2016-08-21 MED ORDER — VITAMIN B-1 100 MG PO TABS
100.0000 mg | ORAL_TABLET | Freq: Every day | ORAL | Status: DC
  Administered 2016-08-21 – 2016-08-23 (×3): 100 mg via ORAL
  Filled 2016-08-21 (×3): qty 1

## 2016-08-21 MED ORDER — NALOXONE HCL 2 MG/2ML IJ SOSY: 1.0000 mg/h | PREFILLED_SYRINGE | INTRAVENOUS | Status: DC

## 2016-08-21 NOTE — Progress Notes (Signed)
Pt called out for RN/ This RN to the bedside. Pt found with eyes closed, requiring multiple verbal attempts to wake him up. Pt states through mumbled, tearful words "I'm hurting, I need something for pain, I want to go home." Pt easily drifts back to sleep. RN needing to stimulate pt to stay awake. RN explained to pt that he is still critically ill, and the substances found in him system will prevent the MD from ordering anything for pain. Will continue to monitor and assess pt closely.
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
## 2016-08-21 NOTE — ED Notes (Signed)
Pt refused blood draw. Notified nurse
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
## 2016-08-21 NOTE — Progress Notes (Signed)
Pt now alert, restless, and agitated stating he wants to go home. RN explained to pt why he is not ready to go home, and renforced how critical he still is. Narcan gtt paused. Due to pt's agitation, precedex gtt initiated. Pt's mother and girlfriend at the bedside. A heat pack was given for pt's chronic back pain.

## 2016-08-21 NOTE — ED Notes (Signed)
2 bags of green clusters w/ potent odor found in pt belongings as well as a bag of unidentifiable pills.  All substances flushed in the presence of [place], CN, [place], Therapist, sports and this Probation officer.

## 2016-08-21 NOTE — ED Notes (Signed)
Patient transported to CT 
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
## 2016-08-22 LAB — CBC WITH DIFFERENTIAL/PLATELET
BASOS ABS: 0 10*3/uL (ref 0.0–0.1)
BASOS PCT: 0 %
EOS ABS: 0 10*3/uL (ref 0.0–0.7)
Eosinophils Relative: 0 %
HCT: 38.5 % — ABNORMAL LOW (ref 39.0–52.0)
HEMOGLOBIN: 13.3 g/dL (ref 13.0–17.0)
Lymphocytes Relative: 9 %
Lymphs Abs: 0.8 10*3/uL (ref 0.7–4.0)
MCH: 27.3 pg (ref 26.0–34.0)
MCHC: 34.5 g/dL (ref 30.0–36.0)
MCV: 79.1 fL (ref 78.0–100.0)
MONOS PCT: 3 %
Monocytes Absolute: 0.3 10*3/uL (ref 0.1–1.0)
NEUTROS PCT: 88 %
Neutro Abs: 7.9 10*3/uL — ABNORMAL HIGH (ref 1.7–7.7)
Platelets: 296 10*3/uL (ref 150–400)
RBC: 4.87 MIL/uL (ref 4.22–5.81)
RDW: 15.5 % (ref 11.5–15.5)
WBC: 9 10*3/uL (ref 4.0–10.5)

## 2016-08-22 LAB — RENAL FUNCTION PANEL
ALBUMIN: 3.5 g/dL (ref 3.5–5.0)
Anion gap: 8 (ref 5–15)
BUN: 7 mg/dL (ref 6–20)
CALCIUM: 9.1 mg/dL (ref 8.9–10.3)
CO2: 26 mmol/L (ref 22–32)
Chloride: 104 mmol/L (ref 101–111)
Creatinine, Ser: 0.85 mg/dL (ref 0.61–1.24)
GFR calc Af Amer: >60 mL/min (ref >60)
GLUCOSE: 126 mg/dL — AB (ref 65–99)
PHOSPHORUS: 2 mg/dL — AB (ref 2.5–4.6)
Potassium: 3.9 mmol/L (ref 3.5–5.1)
SODIUM: 138 mmol/L (ref 135–145)

## 2016-08-22 LAB — MAGNESIUM: MAGNESIUM: 2.1 mg/dL (ref 1.7–2.4)

## 2016-08-22 LAB — GLUCOSE, CAPILLARY: Glucose-Capillary: 107 mg/dL — ABNORMAL HIGH (ref 65–99)

## 2016-08-22 MED ORDER — OXYCODONE HCL 5 MG PO TABS
5.0000 mg | ORAL_TABLET | ORAL | Status: DC | PRN
  Administered 2016-08-22 – 2016-08-23 (×3): 5 mg via ORAL
  Filled 2016-08-22 (×2): qty 1

## 2016-08-22 MED ORDER — PROMETHAZINE HCL 25 MG/ML IJ SOLN
6.2500 mg | Freq: Four times a day (QID) | INTRAMUSCULAR | Status: DC | PRN
  Administered 2016-08-22 – 2016-08-23 (×2): 6.25 mg via INTRAVENOUS
  Filled 2016-08-22 (×2): qty 1

## 2016-08-22 MED ORDER — FENTANYL CITRATE (PF) 100 MCG/2ML IJ SOLN
12.5000 ug | INTRAMUSCULAR | Status: DC | PRN
  Administered 2016-08-22 – 2016-08-23 (×4): 12.5 ug via INTRAVENOUS
  Filled 2016-08-22 (×4): qty 2

## 2016-08-22 MED ORDER — ACETAMINOPHEN 500 MG PO TABS: 1000.0000 mg | ORAL_TABLET | Freq: Four times a day (QID) | ORAL | Status: DC | PRN

## 2016-08-22 MED ORDER — HYDRALAZINE HCL 20 MG/ML IJ SOLN
10.0000 mg | INTRAMUSCULAR | Status: DC | PRN
  Administered 2016-08-22: 10 mg via INTRAVENOUS
  Filled 2016-08-22: qty 1

## 2016-08-22 MED ORDER — OXYCODONE HCL 5 MG PO TABS
5.0000 mg | ORAL_TABLET | Freq: Four times a day (QID) | ORAL | Status: DC | PRN
  Administered 2016-08-22 (×3): 5 mg via ORAL
  Filled 2016-08-22 (×4): qty 1

## 2016-08-22 MED ORDER — OXYCODONE-ACETAMINOPHEN 5-325 MG PO TABS
1.0000 | ORAL_TABLET | Freq: Four times a day (QID) | ORAL | Status: DC | PRN
  Administered 2016-08-22 (×2): 1 via ORAL
  Filled 2016-08-22 (×3): qty 1

## 2016-08-22 MED ORDER — OXYCODONE-ACETAMINOPHEN 10-325 MG PO TABS: 1.0000 | ORAL_TABLET | Freq: Four times a day (QID) | ORAL | Status: DC | PRN

## 2016-08-22 MED ORDER — POTASSIUM PHOSPHATES 15 MMOLE/5ML IV SOLN
10.0000 meq | Freq: Once | INTRAVENOUS | Status: AC
  Administered 2016-08-22: 10 meq via INTRAVENOUS
  Filled 2016-08-22: qty 2.27

## 2016-08-22 MED ORDER — OXYCODONE-ACETAMINOPHEN 5-325 MG PO TABS
1.0000 | ORAL_TABLET | ORAL | Status: DC | PRN
  Administered 2016-08-22 – 2016-08-23 (×4): 1 via ORAL
  Filled 2016-08-22 (×3): qty 1
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
## 2016-08-22 NOTE — Progress Notes (Signed)
[facility] Progress Note Patient Name: Carlos Lawrence DOB: 06-29-1985 MRN: [id number]   Date of Service  08/22/2016  HPI/Events of Note  c/o diffuse body pains.  Uses opiates as outpt.  Admitted with overdose, but mental status now improved.   eICU Interventions  Will order prn oxycodone.     Intervention Category Major Interventions: Other:  Genie Wenke 08/22/2016, 1:22 AM
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
## 2016-08-23 DIAGNOSIS — T50901D Poisoning by unspecified drugs, medicaments and biological substances, accidental (unintentional), subsequent encounter: Secondary | ICD-10-CM

## 2016-08-23 DIAGNOSIS — I10 Essential (primary) hypertension: Secondary | ICD-10-CM

## 2016-08-23 MED ORDER — AMLODIPINE BESYLATE 5 MG PO TABS: 5.0000 mg | ORAL_TABLET | Freq: Every day | ORAL | 0 refills | Status: DC

## 2016-08-23 MED ORDER — ADULT MULTIVITAMIN W/MINERALS CH: 1.0000 | ORAL_TABLET | Freq: Every day | ORAL | 0 refills | Status: DC

## 2016-08-23 MED ORDER — FOLIC ACID 1 MG PO TABS: 1.0000 mg | ORAL_TABLET | Freq: Every day | ORAL | 0 refills | Status: DC

## 2016-08-23 MED ORDER — THIAMINE HCL 100 MG PO TABS: 100.0000 mg | ORAL_TABLET | Freq: Every day | ORAL | 0 refills | Status: DC

## 2016-08-23 NOTE — Progress Notes (Signed)
Patient has complained of nausea all night and has received phenergan and zosyn that is effective for a short time.  Patient has not vomited to my knowledge.  Patient is also in pain and is receiving oxy and fentanyl with relief for only a short while.  Patient pain rated 8/9 in abdomen.  Will continue to monitor.   Wilson Singer, RN

## 2016-08-23 NOTE — Progress Notes (Signed)
Pt became upset that the dr would not Discharge him today or go out side pt decided to leave AMA Rn explained what that meant to the pt and pt decided to leave,  AMA papers sighed and in chart

## 2016-08-23 NOTE — Progress Notes (Signed)
PCCM notified of drop in heart rate to the 50's, at one time to 39.  When arriving to the room, patient was asleep.  Patient awakened asymptomatic, no issues, blood pressure 150's over 90's.  Notified physician of nausea and requested a clear liquid diet.  Orders received for diet change.  Will continue to monitor heart rate. Wilson Singer, RN

## 2016-08-23 NOTE — Discharge Instructions (Signed)
Drug Overdose A drug overdose happens when you take too much of a drug. An overdose can occur with illegal drugs, prescription drugs, or over-the-counter (OTC) drugs. The effects of a drug overdose can be mild, dangerous, or even deadly. What are the causes? This condition may be caused by:  Taking too much of a drug by accident.  Taking too much of a drug on purpose.  An error made by a health care provider who prescribes a drug.  An error made by the pharmacist who fills the prescription order. Drugs that commonly cause overdose include:  Mental health drugs.  Pain medicines.  Illegal drugs.  OTC cough and cold medicines.  Heart medicines.  Seizure medicines. What increases the risk? A drug overdose is more likely in:  Children. They may be attracted to colorful pills. Because of a child's small size, even a small amount of a drug can be dangerous.  Elderly people. They may be taking many different drugs. Elderly people may have difficulty reading labels or remembering when they last took their medicine. The risk of a drug overdose is also higher for someone who:  Takes illegal drugs.  Takes a drug and drinks alcohol.  Has a mental health condition. What are the signs or symptoms? Symptoms of a drug overdose depend on the drug and the amount that was taken. Common danger signs include:  Behavior changes.  Sleepiness.  Slowed breathing.  Nausea and vomiting.  Seizures.  Changes in eye pupil size. The pupil may be very large or very small. If there are signs and symptoms of very low blood pressure (shock) from an overdose, emergency treatment is required. These include:  Cold and clammy skin.  Pale skin.  Blue lips.  Very slow breathing.  Extreme sleepiness.  Loss of consciousness. How is this diagnosed? This condition may be diagnosed based on your symptoms. It is important to tell your health care provider:  All of the drugs that you  took.  When you took the drugs.  Whether you were drinking alcohol. Your health care provider will do a physical exam. This exam may include:  Checking and monitoring your heart rate and rhythm, your temperature, and your blood pressure (vital signs).  Checking your breathing and oxygen level. You may also have tests, including:  Urine tests to check for drugs in your system.  Blood tests to check for:  Drugs in your system.  Signs of an imbalance of your blood minerals (electrolytes).  Liver damage.  Kidney damage. How is this treated? Supporting your vital signs and your breathing is the first step in treating a drug overdose. Treatment may also include:  Giving fluids and electrolytes through an IV tube.  Inserting a breathing tube (endotracheal tube) in your airway to help you breathe.  Passing a tube through your nose and into your stomach (NG tube, or nasogastric tube) to wash out your stomach.  Giving medicines that:  Make you vomit.  Absorb any medicine that is left in your digestive system.  Block or reverse the effect of the drug that caused the overdose.  Filtering your blood through an artificial kidney machine (hemodialysis). You may need this if your overdose is severe or if you have kidney failure.  Ongoing counseling and mental health support if you intentionally overdosed or used an illegal drug. Follow these instructions at home:  Take medicines only as directed by your health care provider. Always ask your health care provider about possible side effects of any  new drug that you start taking.  Keep a list of all of the drugs that you take, including over-the-counter medicines. Bring this list with you to all of your medical visits.  Drink enough fluid to keep your urine clear or pale yellow.  Keep all follow-up visits as directed by your health care provider. This is important. How is this prevented?  Get help if you are struggling  with:  Alcohol or drug use.  Depression or another mental health problem.  Keep the phone number of your local poison control center near your phone or on your cell phone.  Store all medicines in safety containers that are out of the reach of children.  Read the drug inserts that come with your medicines.  Do not use illegal drugs.  Do not drink alcohol when taking drugs.  Do not take medicines that are not prescribed for you. Contact a health care provider if:  Your symptoms return.  You develop new symptoms or side effects when you take medicines. Get help right away if:  You think that you or someone else may have taken too much of a drug. The hotline of the [facility] is 318-133-0412.  You or someone else is having symptoms of a drug overdose.  You have serious thoughts about hurting yourself or others.  You become confused.  You have:  Chest pain.  Difficulty breathing.  A loss of consciousness. Drug overdose is an emergency. Do not wait to see if the symptoms will go away. Get medical help right away. Call your local emergency services (911 in the [place] Do not drive yourself to the hospital. This information is not intended to replace advice given to you by your health care provider. Make sure you discuss any questions you have with your health care provider. Document Released: 08/26/2014 Document Revised: 08/21/2015 Document Reviewed: 04/16/2014 Elsevier Interactive Patient Education  2017 [employer].   Accidental Overdose An accidental overdose happens when a person accidentally takes too much of a substance, such as a prescription medicine, an illegal drug, or an over-the-counter medicine. The effects of an overdose can be mild, dangerous, or even deadly. What are the causes? This condition is caused by taking too much of a medicine or other substance. It often results from:  Lack of knowledge about a substance.  Using more than  one substance at the same time.  An error made by the health care provider who prescribed the substance.  An error made by the pharmacist who fills the prescription order.  A lapse in memory, such as forgetting that you have already taken a dose of medicine.  Suddenly using a substance after a long period of not using it. Substances that can cause an accidental overdose include:  Alcohol.  Medicines that treat mental problems (psychotropic medicines).  Pain medicines.  Cocaine.  Heroin.  Multivitamins that contain iron. What increases the risk? This condition is more likely to occur in:  Children. Children are at increased risk for an overdose even if they are given only a small amount of a substance because of their small size. Children may also be attracted to colorful pills.  Elderly adults. Elderly adults are more likely to overdose because they may be taking many different medicines. They may also have difficulty reading labels or remembering when they last took their medicine.  People who use illegal drugs.  People who drink alcohol while using drugs or certain medicines.  People with certain mental health  conditions. What are the signs or symptoms? Symptoms of this condition depend on the substance and the amount that was taken. Common symptoms include:  Behavior changes, such as confusion.  Sleepiness.  Weakness.  Slowed breathing.  Nausea and vomiting.  Seizures.  Very large or small eye pupil size. An overdose can cause a very serious condition in which your blood pressure drops to a low level (shock). Symptoms of shock include:  Cold and clammy skin.  Pale skin.  Blue lips.  Very slow breathing.  Extreme sleepiness.  Severe confusion. How is this diagnosed? This condition is diagnosed based on your symptoms and a physical exam. You will be asked to tell your health care provider which substances you took and when you took them. During the  physical exam your health care provider may check and monitor your heart rate and rhythm, your temperature, and your blood pressure (vital signs). He or she may also check your breathing and oxygen levels. Sometimes tests are also done to diagnose the condition. Tests may include:  Urine tests. These are done to check for substances in your system.  Blood tests. These may be done to check for:  Substances in your system.  Signs of an imbalance in your blood minerals (electrolytes).  Liver damage.  Kidney damage.  An abnormal acid level in your blood.  An electrocardiogram (ECG). This tests is done to monitor electrical activity in your heart. How is this treated? This condition may need to be treated right away at the hospital. The first step in treatment is supporting your vital signs and your breathing. After that treatment may involve:  Getting fluids and electrolytes through an IV tube.  Having a breathing tube inserted in your airway to help you breathe. The breathing tube is called a endotracheal tube.  Getting medicines. These may include:  Medicines that absorb any substance that is in your digestive system.  Medicines that block or reverse the effect of the substance that caused the overdose.  Having your blood filtered through an artificial kidney machine (hemodialysis).  Ongoing counseling and mental health support. This may be provided if you used an illegal drug. Follow these instructions at home: Medicines   Take over-the-counter and prescription medicines only as told by your health care provider.  Before taking a new medicine, ask your health care provider whether the medicine may cause side effects and whether the medicine might react with other medicines.  Keep a list of all of the medicines that you take, including over-the-counter medicines. Bring this list with you to all of your medical visits. General instructions   Drink enough fluid to keep your  urine clear or pale yellow.  If you are working with a counselor or mental health professional, make sure to follow his or her instructions.  Keep all follow-up visits as told by your health care provider. This is important.  Limit alcohol intake to no more than 1 drink a day for nonpregnant women and 2 drinks a day for men. One drink equals 12 oz of beer, 5 oz of wine, or 1 oz of hard liquor. How is this prevented?  Get help if you are struggling with:  Alcohol or drug use.  Depression or another mental health problem.  Keep the phone number of your local poison control center near your phone or on your cell phone. The hotline of the [facility] is 3023787825.  Store all medicines in safety containers that are out of  the reach of children.  Read the drug inserts that come with your medicines.  Create a system for taking your medicine, such as with a pill box, that will help you avoid taking too much.  Do not drink alcohol while taking medicines unless your health care provider approves.  Do not use illegal drugs.  Do not take medicines that are not prescribed for you.  If you are breastfeeding, talk to your health care provider before taking medicines or other substances. Certain drugs and medicines can be passed through breast milk to your baby. Contact a health care provider if:  Your symptoms return.  You develop new symptoms or side effects after taking a medicine.  You have questions about a possible overdose. Call your local poison control center at 1-657-871-2106. Get help right away if:  You think that you or someone else may have taken too much of a substance.  You or someone else is having symptoms of an overdose.  You have serious thoughts about hurting yourself or others.  You become confused.  You have chest pain.  You have trouble breathing.  You lose consciousness.  You have a seizure.  You have trouble staying  awake. These symptoms may represent a serious problem that is an emergency. Do not wait to see if the symptoms will go away. Get medical help right away. Call your local emergency services (911 in the [place] Do not drive yourself to the hospital. Summary  An accidental overdose happens when a person accidentally takes too much of a medicine or other substance, such as a prescription medicine, an illegal drug, or an over-the-counter medicine.  The effects of an overdose can be mild, dangerous, or even deadly.  This condition is diagnosed based on your symptoms and a physical exam. You will be asked to tell your health care provider which substances you took and when you took them.  This condition may need to be treated right away at the hospital. This information is not intended to replace advice given to you by your health care provider. Make sure you discuss any questions you have with your health care provider. Document Released: 06/25/2004 Document Revised: 03/24/2016 Document Reviewed: 03/24/2016 Elsevier Interactive Patient Education  2017 [employer].
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
## 2016-08-23 NOTE — Progress Notes (Signed)
08/23/2016  2:12 PM  I went to see patient a couple of times to talk to him about his decision to leave hospital AMA.  He will not change his mind.  He says that he feels fine and he will follow up outpatient with his own doctors.  I told him that he would likely be discharged tomorrow.  He declined to stay any longer.    Murvin Natal, MD

## 2016-10-10 ENCOUNTER — Emergency Department (HOSPITAL_COMMUNITY)
Admission: EM | Admit: 2016-10-10 | Discharge: 2016-10-11 | Disposition: A | Discharge status: Home / Self Care | Payer: No Typology Code available for payment source | Source: Home / Self Care | Attending: Emergency Medicine | Admitting: Emergency Medicine

## 2016-10-10 ENCOUNTER — Emergency Department (HOSPITAL_COMMUNITY): Payer: No Typology Code available for payment source

## 2016-10-10 ENCOUNTER — Encounter (HOSPITAL_COMMUNITY): Payer: Self-pay

## 2016-10-10 DIAGNOSIS — J45909 Unspecified asthma, uncomplicated: Secondary | ICD-10-CM | POA: Diagnosis not present

## 2016-10-10 DIAGNOSIS — F17210 Nicotine dependence, cigarettes, uncomplicated: Secondary | ICD-10-CM | POA: Diagnosis not present

## 2016-10-10 DIAGNOSIS — S3992XA Unspecified injury of lower back, initial encounter: Secondary | ICD-10-CM | POA: Diagnosis present

## 2016-10-10 DIAGNOSIS — Y92410 Unspecified street and highway as the place of occurrence of the external cause: Secondary | ICD-10-CM | POA: Insufficient documentation

## 2016-10-10 DIAGNOSIS — Z79899 Other long term (current) drug therapy: Secondary | ICD-10-CM | POA: Diagnosis not present

## 2016-10-10 DIAGNOSIS — I10 Essential (primary) hypertension: Secondary | ICD-10-CM | POA: Diagnosis not present

## 2016-10-10 DIAGNOSIS — Y939 Activity, unspecified: Secondary | ICD-10-CM | POA: Insufficient documentation

## 2016-10-10 DIAGNOSIS — Y999 Unspecified external cause status: Secondary | ICD-10-CM | POA: Insufficient documentation

## 2016-10-10 DIAGNOSIS — S39012A Strain of muscle, fascia and tendon of lower back, initial encounter: Secondary | ICD-10-CM | POA: Diagnosis not present

## 2016-10-10 DIAGNOSIS — M25532 Pain in left wrist: Secondary | ICD-10-CM | POA: Insufficient documentation

## 2016-10-10 NOTE — ED Notes (Signed)
Pt in peds with children. 
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
## 2016-10-11 ENCOUNTER — Emergency Department (HOSPITAL_COMMUNITY)
Admission: EM | Admit: 2016-10-11 | Discharge: 2016-10-11 | Disposition: A | Discharge status: Home / Self Care | Payer: No Typology Code available for payment source | Attending: Emergency Medicine | Admitting: Emergency Medicine

## 2016-10-11 ENCOUNTER — Emergency Department (HOSPITAL_COMMUNITY): Payer: No Typology Code available for payment source

## 2016-10-11 ENCOUNTER — Encounter (HOSPITAL_COMMUNITY): Payer: Self-pay | Admitting: *Deleted

## 2016-10-11 DIAGNOSIS — Y92410 Unspecified street and highway as the place of occurrence of the external cause: Secondary | ICD-10-CM | POA: Insufficient documentation

## 2016-10-11 DIAGNOSIS — S39012A Strain of muscle, fascia and tendon of lower back, initial encounter: Secondary | ICD-10-CM | POA: Insufficient documentation

## 2016-10-11 DIAGNOSIS — Y939 Activity, unspecified: Secondary | ICD-10-CM | POA: Insufficient documentation

## 2016-10-11 DIAGNOSIS — I10 Essential (primary) hypertension: Secondary | ICD-10-CM | POA: Insufficient documentation

## 2016-10-11 DIAGNOSIS — F17210 Nicotine dependence, cigarettes, uncomplicated: Secondary | ICD-10-CM | POA: Insufficient documentation

## 2016-10-11 DIAGNOSIS — Z79899 Other long term (current) drug therapy: Secondary | ICD-10-CM | POA: Insufficient documentation

## 2016-10-11 DIAGNOSIS — J45909 Unspecified asthma, uncomplicated: Secondary | ICD-10-CM | POA: Insufficient documentation

## 2016-10-11 DIAGNOSIS — Y999 Unspecified external cause status: Secondary | ICD-10-CM | POA: Insufficient documentation

## 2016-10-11 MED ORDER — CYCLOBENZAPRINE HCL 10 MG PO TABS: 10.0000 mg | ORAL_TABLET | Freq: Three times a day (TID) | ORAL | 0 refills | Status: DC | PRN

## 2016-10-11 MED ORDER — NAPROXEN 375 MG PO TABS: 375.0000 mg | ORAL_TABLET | Freq: Two times a day (BID) | ORAL | 0 refills | Status: DC

## 2016-10-11 NOTE — ED Notes (Signed)
Patient came out of the room stating he does not want to stay anymore. PA made aware.  Patient left without seeing provider
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
## 2016-10-11 NOTE — ED Notes (Signed)
Pt standing in the doorway, ready for his d/c paper. Doesn't want to stay for vs or to sign. Papers given with work note. Pt ambulatory to d/c no distress.
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
## 2016-11-25 ENCOUNTER — Emergency Department (HOSPITAL_COMMUNITY): Payer: Self-pay

## 2016-11-25 ENCOUNTER — Inpatient Hospital Stay (HOSPITAL_COMMUNITY)
Admission: EM | Admit: 2016-11-25 | Discharge: 2016-11-26 | DRG: 605 | Discharge status: Against Medical Advice | Payer: Self-pay | Attending: Surgery | Admitting: Surgery

## 2016-11-25 ENCOUNTER — Encounter (HOSPITAL_COMMUNITY): Payer: Self-pay | Admitting: Emergency Medicine

## 2016-11-25 DIAGNOSIS — I959 Hypotension, unspecified: Secondary | ICD-10-CM | POA: Diagnosis present

## 2016-11-25 DIAGNOSIS — D62 Acute posthemorrhagic anemia: Secondary | ICD-10-CM | POA: Diagnosis present

## 2016-11-25 DIAGNOSIS — F17200 Nicotine dependence, unspecified, uncomplicated: Secondary | ICD-10-CM | POA: Diagnosis present

## 2016-11-25 DIAGNOSIS — W3400XA Accidental discharge from unspecified firearms or gun, initial encounter: Secondary | ICD-10-CM

## 2016-11-25 DIAGNOSIS — S71131A Puncture wound without foreign body, right thigh, initial encounter: Secondary | ICD-10-CM | POA: Diagnosis present

## 2016-11-25 DIAGNOSIS — Z9189 Other specified personal risk factors, not elsewhere classified: Secondary | ICD-10-CM

## 2016-11-25 DIAGNOSIS — S81032A Puncture wound without foreign body, left knee, initial encounter: Principal | ICD-10-CM | POA: Diagnosis present

## 2016-11-25 DIAGNOSIS — S91332A Puncture wound without foreign body, left foot, initial encounter: Secondary | ICD-10-CM | POA: Diagnosis present

## 2016-11-25 LAB — I-STAT CHEM 8, ED
BUN: 13 mg/dL (ref 6–20)
Calcium, Ion: 1.02 mmol/L — ABNORMAL LOW (ref 1.15–1.40)
Chloride: 105 mmol/L (ref 101–111)
Creatinine, Ser: 1.4 mg/dL — ABNORMAL HIGH (ref 0.61–1.24)
Glucose, Bld: 176 mg/dL — ABNORMAL HIGH (ref 65–99)
HEMATOCRIT: 37 % — AB (ref 39.0–52.0)
HEMOGLOBIN: 12.6 g/dL — AB (ref 13.0–17.0)
Potassium: 3.9 mmol/L (ref 3.5–5.1)
SODIUM: 137 mmol/L (ref 135–145)
TCO2: 21 mmol/L (ref 0–100)

## 2016-11-25 LAB — ABO/RH: ABO/RH(D): B POS

## 2016-11-25 LAB — I-STAT CG4 LACTIC ACID, ED: Lactic Acid, Venous: 5.59 mmol/L (ref 0.5–1.9)

## 2016-11-25 MED ORDER — PANTOPRAZOLE SODIUM 40 MG IV SOLR: 40.0000 mg | Freq: Every day | INTRAVENOUS | Status: DC

## 2016-11-25 MED ORDER — HYDROMORPHONE HCL 1 MG/ML IJ SOLN: 1.0000 mg | INTRAMUSCULAR | Status: DC | PRN

## 2016-11-25 MED ORDER — TETANUS-DIPHTH-ACELL PERTUSSIS 5-2.5-18.5 LF-MCG/0.5 IM SUSP
INTRAMUSCULAR | Status: AC
  Administered 2016-11-25: 0.5 mL via INTRAMUSCULAR
  Filled 2016-11-25: qty 0.5

## 2016-11-25 MED ORDER — SODIUM CHLORIDE 0.9 % IV SOLN
Freq: Once | INTRAVENOUS | Status: AC
  Administered 2016-11-25: 23:00:00 via INTRAVENOUS

## 2016-11-25 MED ORDER — HYDRALAZINE HCL 20 MG/ML IJ SOLN: 10.0000 mg | INTRAMUSCULAR | Status: DC | PRN

## 2016-11-25 MED ORDER — OXYCODONE HCL 5 MG PO TABS: 10.0000 mg | ORAL_TABLET | ORAL | Status: DC | PRN

## 2016-11-25 MED ORDER — DEXTROSE-NACL 5-0.9 % IV SOLN: INTRAVENOUS | Status: DC

## 2016-11-25 MED ORDER — ENOXAPARIN SODIUM 40 MG/0.4ML ~~LOC~~ SOLN: 40.0000 mg | SUBCUTANEOUS | Status: DC

## 2016-11-25 MED ORDER — TETANUS-DIPHTH-ACELL PERTUSSIS 5-2.5-18.5 LF-MCG/0.5 IM SUSP
0.5000 mL | Freq: Once | INTRAMUSCULAR | Status: AC
  Administered 2016-11-25: 0.5 mL via INTRAMUSCULAR

## 2016-11-25 MED ORDER — CEFAZOLIN SODIUM-DEXTROSE 1-4 GM/50ML-% IV SOLN
1.0000 g | Freq: Once | INTRAVENOUS | Status: AC
  Administered 2016-11-26: 1 g via INTRAVENOUS
  Filled 2016-11-25: qty 50

## 2016-11-25 MED ORDER — ONDANSETRON HCL 4 MG/2ML IJ SOLN: 4.0000 mg | Freq: Four times a day (QID) | INTRAMUSCULAR | Status: DC | PRN

## 2016-11-25 MED ORDER — MORPHINE SULFATE (PF) 4 MG/ML IV SOLN
INTRAVENOUS | Status: AC
  Administered 2016-11-25: 2 mg
  Filled 2016-11-25: qty 1

## 2016-11-25 MED ORDER — ONDANSETRON 4 MG PO TBDP: 4.0000 mg | ORAL_TABLET | Freq: Four times a day (QID) | ORAL | Status: DC | PRN

## 2016-11-25 MED ORDER — PANTOPRAZOLE SODIUM 40 MG PO TBEC: 40.0000 mg | DELAYED_RELEASE_TABLET | Freq: Every day | ORAL | Status: DC
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
## 2016-11-25 NOTE — ED Notes (Signed)
Portable x-rays being completed at this time.
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
## 2016-11-25 NOTE — ED Notes (Addendum)
Portable x-rays completed.  Pt to CT on monitor with RN

## 2016-11-26 LAB — BPAM FFP
Blood Product Expiration Date: 201808072359
Blood Product Expiration Date: 201808082359
ISSUE DATE / TIME: 201808032233
ISSUE DATE / TIME: 201808032233
Unit Type and Rh: 6200
Unit Type and Rh: 6200

## 2016-11-26 LAB — PREPARE FRESH FROZEN PLASMA
Unit division: 0
Unit division: 0

## 2016-11-26 LAB — HIV ANTIBODY (ROUTINE TESTING W REFLEX): HIV Screen 4th Generation wRfx: NONREACTIVE

## 2016-11-26 LAB — ETHANOL

## 2016-11-26 NOTE — ED Notes (Signed)
Blood via rapid infuser

## 2016-11-26 NOTE — ED Notes (Signed)
[employer] at bedside talking with pt.  Pt wanting to leave. RN explained to pt that he needed IV antibiotics and should stay for pain control.  Pt refusing to stay.
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
## 2016-11-26 NOTE — ED Notes (Signed)
Pt unwilling to sit in [facility], unwilling to be taken back into a room, pt demanding pain medication "it's not hard to follow my god damn instructions" pt continues to be unwilling to listen to this RN or MD regarding need for admission and continued treatment.

## 2016-11-26 NOTE — ED Notes (Signed)
Pt now in hallway, pt cursing requesting to leave

## 2016-11-27 ENCOUNTER — Emergency Department (HOSPITAL_COMMUNITY)
Admission: EM | Admit: 2016-11-27 | Discharge: 2016-11-27 | Disposition: A | Discharge status: Home / Self Care | Payer: Self-pay | Attending: Emergency Medicine | Admitting: Emergency Medicine

## 2016-11-27 ENCOUNTER — Encounter (HOSPITAL_COMMUNITY): Payer: Self-pay | Admitting: *Deleted

## 2016-11-27 DIAGNOSIS — M79662 Pain in left lower leg: Secondary | ICD-10-CM | POA: Insufficient documentation

## 2016-11-27 DIAGNOSIS — I10 Essential (primary) hypertension: Secondary | ICD-10-CM | POA: Insufficient documentation

## 2016-11-27 DIAGNOSIS — J45909 Unspecified asthma, uncomplicated: Secondary | ICD-10-CM | POA: Insufficient documentation

## 2016-11-27 DIAGNOSIS — M79661 Pain in right lower leg: Secondary | ICD-10-CM | POA: Insufficient documentation

## 2016-11-27 DIAGNOSIS — W3400XD Accidental discharge from unspecified firearms or gun, subsequent encounter: Secondary | ICD-10-CM | POA: Insufficient documentation

## 2016-11-27 DIAGNOSIS — F17210 Nicotine dependence, cigarettes, uncomplicated: Secondary | ICD-10-CM | POA: Insufficient documentation

## 2016-11-27 DIAGNOSIS — W3400XA Accidental discharge from unspecified firearms or gun, initial encounter: Secondary | ICD-10-CM

## 2016-11-27 DIAGNOSIS — Z79899 Other long term (current) drug therapy: Secondary | ICD-10-CM | POA: Insufficient documentation

## 2016-11-27 MED ORDER — OXYCODONE-ACETAMINOPHEN 5-325 MG PO TABS
1.0000 | ORAL_TABLET | Freq: Once | ORAL | Status: AC
  Administered 2016-11-27: 1 via ORAL
  Filled 2016-11-27: qty 1

## 2016-11-27 MED ORDER — OXYCODONE-ACETAMINOPHEN 5-325 MG PO TABS: 1.0000 | ORAL_TABLET | Freq: Four times a day (QID) | ORAL | 0 refills | Status: DC | PRN

## 2016-11-27 NOTE — Discharge Instructions (Signed)
As discussed, your evaluation today has been largely reassuring.  But, it is important that you monitor your condition carefully, and do not hesitate to return to the ED if you develop new, or concerning changes in your condition. ? ?Otherwise, please follow-up with your physician for appropriate ongoing care. ? ?

## 2016-11-27 NOTE — ED Notes (Signed)
Patient given supplies to take home for home care.

## 2016-11-27 NOTE — ED Notes (Signed)
EDP at bedside at this time.  

## 2016-11-27 NOTE — ED Notes (Addendum)
Wound care in progress
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
## 2016-11-28 ENCOUNTER — Encounter (HOSPITAL_COMMUNITY): Payer: Self-pay | Admitting: *Deleted

## 2016-11-30 LAB — BPAM RBC
Blood Product Expiration Date: 201808282359
Blood Product Expiration Date: 201809042359
ISSUE DATE / TIME: 201808032233
ISSUE DATE / TIME: 201808032233
UNIT TYPE AND RH: 9500
UNIT TYPE AND RH: 9500

## 2016-11-30 LAB — TYPE AND SCREEN
ABO/RH(D): B POS
Antibody Screen: NEGATIVE
UNIT DIVISION: 0
UNIT DIVISION: 0

## 2016-12-12 ENCOUNTER — Encounter (HOSPITAL_COMMUNITY): Payer: Self-pay | Admitting: *Deleted

## 2016-12-12 ENCOUNTER — Ambulatory Visit (HOSPITAL_COMMUNITY)
Admission: EM | Admit: 2016-12-12 | Discharge: 2016-12-12 | Disposition: A | Discharge status: Home / Self Care | Payer: Self-pay | Attending: Family Medicine | Admitting: Family Medicine

## 2016-12-12 DIAGNOSIS — M62838 Other muscle spasm: Secondary | ICD-10-CM

## 2016-12-12 DIAGNOSIS — M79605 Pain in left leg: Secondary | ICD-10-CM

## 2016-12-12 DIAGNOSIS — W3400XA Accidental discharge from unspecified firearms or gun, initial encounter: Secondary | ICD-10-CM

## 2016-12-12 DIAGNOSIS — M79604 Pain in right leg: Secondary | ICD-10-CM

## 2016-12-12 LAB — POCT I-STAT, CHEM 8
BUN: 11 mg/dL (ref 6–20)
CHLORIDE: 102 mmol/L (ref 101–111)
Calcium, Ion: 1.23 mmol/L (ref 1.15–1.40)
Creatinine, Ser: 0.8 mg/dL (ref 0.61–1.24)
Glucose, Bld: 97 mg/dL (ref 65–99)
HEMATOCRIT: 36 % — AB (ref 39.0–52.0)
HEMOGLOBIN: 12.2 g/dL — AB (ref 13.0–17.0)
POTASSIUM: 4 mmol/L (ref 3.5–5.1)
SODIUM: 142 mmol/L (ref 135–145)
TCO2: 27 mmol/L (ref 0–100)

## 2016-12-12 MED ORDER — KETOROLAC TROMETHAMINE 60 MG/2ML IM SOLN
60.0000 mg | Freq: Once | INTRAMUSCULAR | Status: AC
  Administered 2016-12-12: 60 mg via INTRAMUSCULAR

## 2016-12-12 MED ORDER — CYCLOBENZAPRINE HCL 10 MG PO TABS: 10.0000 mg | ORAL_TABLET | Freq: Every day | ORAL | 0 refills | Status: DC

## 2016-12-12 MED ORDER — KETOROLAC TROMETHAMINE 60 MG/2ML IM SOLN
INTRAMUSCULAR | Status: AC
  Filled 2016-12-12: qty 2
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
## 2016-12-28 ENCOUNTER — Telehealth (HOSPITAL_COMMUNITY): Payer: Self-pay
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
## 2017-01-29 ENCOUNTER — Emergency Department (HOSPITAL_COMMUNITY): Payer: Self-pay

## 2017-01-29 ENCOUNTER — Emergency Department (HOSPITAL_COMMUNITY)
Admission: EM | Admit: 2017-01-29 | Discharge: 2017-01-29 | Disposition: A | Discharge status: Home / Self Care | Payer: Self-pay | Attending: Emergency Medicine | Admitting: Emergency Medicine

## 2017-01-29 ENCOUNTER — Encounter (HOSPITAL_COMMUNITY): Payer: Self-pay | Admitting: *Deleted

## 2017-01-29 DIAGNOSIS — R1012 Left upper quadrant pain: Secondary | ICD-10-CM | POA: Insufficient documentation

## 2017-01-29 DIAGNOSIS — R109 Unspecified abdominal pain: Secondary | ICD-10-CM

## 2017-01-29 DIAGNOSIS — Z79899 Other long term (current) drug therapy: Secondary | ICD-10-CM | POA: Insufficient documentation

## 2017-01-29 DIAGNOSIS — I10 Essential (primary) hypertension: Secondary | ICD-10-CM | POA: Insufficient documentation

## 2017-01-29 DIAGNOSIS — J45909 Unspecified asthma, uncomplicated: Secondary | ICD-10-CM | POA: Insufficient documentation

## 2017-01-29 DIAGNOSIS — F17210 Nicotine dependence, cigarettes, uncomplicated: Secondary | ICD-10-CM | POA: Insufficient documentation

## 2017-01-29 LAB — URINALYSIS, ROUTINE W REFLEX MICROSCOPIC
Bacteria, UA: NONE SEEN
Bilirubin Urine: NEGATIVE
GLUCOSE, UA: NEGATIVE mg/dL
HGB URINE DIPSTICK: NEGATIVE
Ketones, ur: NEGATIVE mg/dL
Leukocytes, UA: NEGATIVE
NITRITE: NEGATIVE
PROTEIN: 30 mg/dL — AB
SPECIFIC GRAVITY, URINE: 1.027 (ref 1.005–1.030)
WBC, UA: NONE SEEN WBC/hpf (ref 0–5)
pH: 5 (ref 5.0–8.0)

## 2017-01-29 LAB — CBC WITH DIFFERENTIAL/PLATELET
BASOS ABS: 0 10*3/uL (ref 0.0–0.1)
Basophils Relative: 0 %
EOS PCT: 0 %
Eosinophils Absolute: 0 10*3/uL (ref 0.0–0.7)
HCT: 36.6 % — ABNORMAL LOW (ref 39.0–52.0)
Hemoglobin: 12.5 g/dL — ABNORMAL LOW (ref 13.0–17.0)
LYMPHS ABS: 1.2 10*3/uL (ref 0.7–4.0)
LYMPHS PCT: 17 %
MCH: 26.5 pg (ref 26.0–34.0)
MCHC: 34.2 g/dL (ref 30.0–36.0)
MCV: 77.7 fL — ABNORMAL LOW (ref 78.0–100.0)
MONO ABS: 0.4 10*3/uL (ref 0.1–1.0)
Monocytes Relative: 5 %
NEUTROS ABS: 5.7 10*3/uL (ref 1.7–7.7)
Neutrophils Relative %: 78 %
PLATELETS: 298 10*3/uL (ref 150–400)
RBC: 4.71 MIL/uL (ref 4.22–5.81)
RDW: 14.8 % (ref 11.5–15.5)
WBC: 7.3 10*3/uL (ref 4.0–10.5)

## 2017-01-29 LAB — BASIC METABOLIC PANEL
ANION GAP: 8 (ref 5–15)
BUN: 6 mg/dL (ref 6–20)
CALCIUM: 9 mg/dL (ref 8.9–10.3)
CO2: 22 mmol/L (ref 22–32)
Chloride: 108 mmol/L (ref 101–111)
Creatinine, Ser: 0.88 mg/dL (ref 0.61–1.24)
GLUCOSE: 83 mg/dL (ref 65–99)
Potassium: 3.6 mmol/L (ref 3.5–5.1)
SODIUM: 138 mmol/L (ref 135–145)

## 2017-01-29 MED ORDER — MORPHINE SULFATE (PF) 4 MG/ML IV SOLN
4.0000 mg | Freq: Once | INTRAVENOUS | Status: AC
  Administered 2017-01-29: 4 mg via INTRAVENOUS
  Filled 2017-01-29: qty 1

## 2017-01-29 MED ORDER — SODIUM CHLORIDE 0.9 % IV BOLUS (SEPSIS)
1000.0000 mL | Freq: Once | INTRAVENOUS | Status: AC
  Administered 2017-01-29: 1000 mL via INTRAVENOUS
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
## 2017-01-29 NOTE — Discharge Instructions (Signed)
You have been evaluated for your flank pain.  Your urine did not show any evidence of infection.  Your labs are reassuring.  Please follow up with your urologist for further care.  Return if you have any concerns.
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
## 2017-01-29 NOTE — ED Notes (Signed)
Writer notified PA Rona Ravens of abnormal I-stat chem result.

## 2017-01-29 NOTE — ED Notes (Signed)
Pt denies concerns with dc 

## 2017-01-29 NOTE — ED Notes (Signed)
Per patient he needs to leave for a family emergency, Gertie Fey PA notified

## 2017-07-25 ENCOUNTER — Other Ambulatory Visit: Payer: Self-pay

## 2017-07-25 ENCOUNTER — Emergency Department (HOSPITAL_COMMUNITY): Payer: Self-pay

## 2017-07-25 ENCOUNTER — Inpatient Hospital Stay (HOSPITAL_COMMUNITY)
Admission: EM | Admit: 2017-07-25 | Discharge: 2017-07-29 | DRG: 638 | Disposition: A | Discharge status: Home / Self Care | Payer: Self-pay | Attending: Internal Medicine | Admitting: Internal Medicine

## 2017-07-25 ENCOUNTER — Encounter (HOSPITAL_COMMUNITY): Payer: Self-pay | Admitting: *Deleted

## 2017-07-25 ENCOUNTER — Inpatient Hospital Stay (HOSPITAL_COMMUNITY): Payer: Self-pay

## 2017-07-25 DIAGNOSIS — J209 Acute bronchitis, unspecified: Secondary | ICD-10-CM | POA: Diagnosis present

## 2017-07-25 DIAGNOSIS — J45909 Unspecified asthma, uncomplicated: Secondary | ICD-10-CM | POA: Diagnosis present

## 2017-07-25 DIAGNOSIS — N179 Acute kidney failure, unspecified: Secondary | ICD-10-CM | POA: Diagnosis present

## 2017-07-25 DIAGNOSIS — E876 Hypokalemia: Secondary | ICD-10-CM | POA: Diagnosis not present

## 2017-07-25 DIAGNOSIS — I10 Essential (primary) hypertension: Secondary | ICD-10-CM | POA: Diagnosis present

## 2017-07-25 DIAGNOSIS — E875 Hyperkalemia: Secondary | ICD-10-CM | POA: Diagnosis present

## 2017-07-25 DIAGNOSIS — R058 Other specified cough: Secondary | ICD-10-CM

## 2017-07-25 DIAGNOSIS — Z79899 Other long term (current) drug therapy: Secondary | ICD-10-CM

## 2017-07-25 DIAGNOSIS — F17210 Nicotine dependence, cigarettes, uncomplicated: Secondary | ICD-10-CM | POA: Diagnosis present

## 2017-07-25 DIAGNOSIS — E0810 Diabetes mellitus due to underlying condition with ketoacidosis without coma: Secondary | ICD-10-CM

## 2017-07-25 DIAGNOSIS — R05 Cough: Secondary | ICD-10-CM

## 2017-07-25 DIAGNOSIS — E1310 Other specified diabetes mellitus with ketoacidosis without coma: Secondary | ICD-10-CM

## 2017-07-25 DIAGNOSIS — Z9049 Acquired absence of other specified parts of digestive tract: Secondary | ICD-10-CM

## 2017-07-25 DIAGNOSIS — D638 Anemia in other chronic diseases classified elsewhere: Secondary | ICD-10-CM | POA: Diagnosis present

## 2017-07-25 DIAGNOSIS — E1110 Type 2 diabetes mellitus with ketoacidosis without coma: Principal | ICD-10-CM | POA: Diagnosis present

## 2017-07-25 LAB — CBC WITH DIFFERENTIAL/PLATELET
BASOS ABS: 0 10*3/uL (ref 0.0–0.1)
BASOS PCT: 0 %
Eosinophils Absolute: 0 10*3/uL (ref 0.0–0.7)
Eosinophils Relative: 0 %
HEMATOCRIT: 47.5 % (ref 39.0–52.0)
HEMOGLOBIN: 15.9 g/dL (ref 13.0–17.0)
Lymphocytes Relative: 12 %
Lymphs Abs: 2.5 10*3/uL (ref 0.7–4.0)
MCH: 26.5 pg (ref 26.0–34.0)
MCHC: 33.5 g/dL (ref 30.0–36.0)
MCV: 79.3 fL (ref 78.0–100.0)
Monocytes Absolute: 0.7 10*3/uL (ref 0.1–1.0)
Monocytes Relative: 3 %
NEUTROS ABS: 18.6 10*3/uL — AB (ref 1.7–7.7)
Neutrophils Relative %: 85 %
Platelets: 276 10*3/uL (ref 150–400)
RBC: 5.99 MIL/uL — ABNORMAL HIGH (ref 4.22–5.81)
RDW: 15.6 % — ABNORMAL HIGH (ref 11.5–15.5)
WBC: 21.8 10*3/uL — ABNORMAL HIGH (ref 4.0–10.5)

## 2017-07-25 LAB — URINALYSIS, ROUTINE W REFLEX MICROSCOPIC
Bacteria, UA: NONE SEEN
Bilirubin Urine: NEGATIVE
Glucose, UA: >=500 mg/dL — AB
KETONES UR: 80 mg/dL — AB
Leukocytes, UA: NEGATIVE
Nitrite: NEGATIVE
PH: 5 (ref 5.0–8.0)
Protein, ur: 100 mg/dL — AB
SPECIFIC GRAVITY, URINE: 1.021 (ref 1.005–1.030)
Squamous Epithelial / LPF: NONE SEEN

## 2017-07-25 LAB — BASIC METABOLIC PANEL
BUN: 34 mg/dL — AB (ref 6–20)
CO2: <7 mmol/L — ABNORMAL LOW (ref 22–32)
Calcium: 8.6 mg/dL — ABNORMAL LOW (ref 8.9–10.3)
Chloride: 106 mmol/L (ref 101–111)
Creatinine, Ser: 2.73 mg/dL — ABNORMAL HIGH (ref 0.61–1.24)
GFR calc non Af Amer: 29 mL/min — ABNORMAL LOW (ref >60)
GFR, EST AFRICAN AMERICAN: 34 mL/min — AB (ref >60)
Glucose, Bld: 766 mg/dL (ref 65–99)
POTASSIUM: 6.7 mmol/L — AB (ref 3.5–5.1)
SODIUM: 137 mmol/L (ref 135–145)

## 2017-07-25 LAB — COMPREHENSIVE METABOLIC PANEL
ALBUMIN: 4.2 g/dL (ref 3.5–5.0)
ALK PHOS: 112 U/L (ref 38–126)
ALT: 14 U/L — ABNORMAL LOW (ref 17–63)
AST: 17 U/L (ref 15–41)
BILIRUBIN TOTAL: 1.9 mg/dL — AB (ref 0.3–1.2)
BUN: 34 mg/dL — AB (ref 6–20)
CO2: <7 mmol/L — ABNORMAL LOW (ref 22–32)
Calcium: 9.2 mg/dL (ref 8.9–10.3)
Chloride: 93 mmol/L — ABNORMAL LOW (ref 101–111)
Creatinine, Ser: 3.27 mg/dL — ABNORMAL HIGH (ref 0.61–1.24)
GFR calc Af Amer: 27 mL/min — ABNORMAL LOW (ref >60)
GFR calc non Af Amer: 24 mL/min — ABNORMAL LOW (ref >60)
Glucose, Bld: 962 mg/dL (ref 65–99)
Potassium: 7.2 mmol/L (ref 3.5–5.1)
Sodium: 130 mmol/L — ABNORMAL LOW (ref 135–145)
TOTAL PROTEIN: 8.5 g/dL — AB (ref 6.5–8.1)

## 2017-07-25 LAB — CBG MONITORING, ED
GLUCOSE-CAPILLARY: 374 mg/dL — AB (ref 65–99)
Glucose-Capillary: >600 mg/dL (ref 65–99)
Glucose-Capillary: 537 mg/dL (ref 65–99)

## 2017-07-25 LAB — HEMOGLOBIN A1C
Hgb A1c MFr Bld: 13.2 % — ABNORMAL HIGH (ref 4.8–5.6)
Mean Plasma Glucose: 332.14 mg/dL

## 2017-07-25 LAB — RAPID URINE DRUG SCREEN, HOSP PERFORMED
Amphetamines: NOT DETECTED
BARBITURATES: NOT DETECTED
BENZODIAZEPINES: NOT DETECTED
COCAINE: NOT DETECTED
Opiates: NOT DETECTED
Tetrahydrocannabinol: NOT DETECTED

## 2017-07-25 LAB — I-STAT CHEM 8, ED
BUN: 37 mg/dL — AB (ref 6–20)
Calcium, Ion: 1.14 mmol/L — ABNORMAL LOW (ref 1.15–1.40)
Chloride: 104 mmol/L (ref 101–111)
Creatinine, Ser: 2.4 mg/dL — ABNORMAL HIGH (ref 0.61–1.24)
Glucose, Bld: >700 mg/dL (ref 65–99)
HEMATOCRIT: 53 % — AB (ref 39.0–52.0)
HEMOGLOBIN: 18 g/dL — AB (ref 13.0–17.0)
POTASSIUM: 7.1 mmol/L — AB (ref 3.5–5.1)
Sodium: 131 mmol/L — ABNORMAL LOW (ref 135–145)
TCO2: 8 mmol/L — AB (ref 22–32)

## 2017-07-25 LAB — TROPONIN I: Troponin I: <0.03 ng/mL (ref <0.03)

## 2017-07-25 MED ORDER — SODIUM CHLORIDE 0.9 % IV SOLN
INTRAVENOUS | Status: DC
  Administered 2017-07-25: 5.4 [IU]/h via INTRAVENOUS
  Filled 2017-07-25: qty 1

## 2017-07-25 MED ORDER — SODIUM CHLORIDE 0.9 % IV SOLN
INTRAVENOUS | Status: DC
  Administered 2017-07-26: 7.9 [IU]/h via INTRAVENOUS
  Administered 2017-07-27: 1.6 [IU]/h via INTRAVENOUS
  Filled 2017-07-25 (×3): qty 1

## 2017-07-25 MED ORDER — CALCIUM GLUCONATE 10 % IV SOLN
1.0000 g | Freq: Once | INTRAVENOUS | Status: AC
  Administered 2017-07-25: 1 g via INTRAVENOUS
  Filled 2017-07-25: qty 10

## 2017-07-25 MED ORDER — DEXTROSE-NACL 5-0.45 % IV SOLN: INTRAVENOUS | Status: DC

## 2017-07-25 MED ORDER — INSULIN ASPART 100 UNIT/ML IV SOLN: 10.0000 [IU] | Freq: Once | INTRAVENOUS | Status: DC

## 2017-07-25 MED ORDER — SODIUM CHLORIDE 0.9 % IV SOLN
INTRAVENOUS | Status: DC
  Administered 2017-07-25: 18:00:00 via INTRAVENOUS

## 2017-07-25 MED ORDER — SODIUM CHLORIDE 0.9 % IV SOLN: INTRAVENOUS | Status: DC

## 2017-07-25 MED ORDER — INSULIN ASPART 100 UNIT/ML ~~LOC~~ SOLN
10.0000 [IU] | Freq: Once | SUBCUTANEOUS | Status: AC
  Administered 2017-07-25: 10 [IU] via SUBCUTANEOUS
  Filled 2017-07-25: qty 1

## 2017-07-25 MED ORDER — DEXTROSE-NACL 5-0.45 % IV SOLN
INTRAVENOUS | Status: DC
  Administered 2017-07-26: 01:00:00 via INTRAVENOUS

## 2017-07-25 MED ORDER — SODIUM CHLORIDE 0.9 % IV BOLUS
1000.0000 mL | Freq: Once | INTRAVENOUS | Status: AC
  Administered 2017-07-25: 1000 mL via INTRAVENOUS

## 2017-07-25 MED ORDER — POTASSIUM CHLORIDE 10 MEQ/100ML IV SOLN
10.0000 meq | INTRAVENOUS | Status: AC
  Administered 2017-07-25 – 2017-07-26 (×2): 10 meq via INTRAVENOUS
  Filled 2017-07-25 (×5): qty 100

## 2017-07-25 MED ORDER — SODIUM CHLORIDE 0.9 % IV BOLUS
1000.0000 mL | Freq: Once | INTRAVENOUS | Status: DC
  Administered 2017-07-25: 1000 mL via INTRAVENOUS

## 2017-07-25 MED ORDER — DEXTROSE 50 % IV SOLN: 25.0000 mL | INTRAVENOUS | Status: DC | PRN

## 2017-07-25 MED ORDER — INSULIN REGULAR BOLUS VIA INFUSION
0.0000 [IU] | Freq: Three times a day (TID) | INTRAVENOUS | Status: DC
  Filled 2017-07-25: qty 10

## 2017-07-25 MED ORDER — SODIUM CHLORIDE 0.9 % IV BOLUS
2000.0000 mL | Freq: Once | INTRAVENOUS | Status: AC
  Administered 2017-07-25: 2000 mL via INTRAVENOUS

## 2017-07-25 NOTE — ED Notes (Signed)
Patient transported to CT 
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
## 2017-07-26 DIAGNOSIS — E0810 Diabetes mellitus due to underlying condition with ketoacidosis without coma: Secondary | ICD-10-CM

## 2017-07-26 DIAGNOSIS — E1310 Other specified diabetes mellitus with ketoacidosis without coma: Secondary | ICD-10-CM

## 2017-07-26 DIAGNOSIS — N179 Acute kidney failure, unspecified: Secondary | ICD-10-CM

## 2017-07-26 DIAGNOSIS — I10 Essential (primary) hypertension: Secondary | ICD-10-CM

## 2017-07-26 DIAGNOSIS — E875 Hyperkalemia: Secondary | ICD-10-CM

## 2017-07-26 LAB — BASIC METABOLIC PANEL
ANION GAP: 17 — AB (ref 5–15)
Anion gap: 18 — ABNORMAL HIGH (ref 5–15)
Anion gap: 12 (ref 5–15)
Anion gap: 13 (ref 5–15)
Anion gap: 16 — ABNORMAL HIGH (ref 5–15)
BUN: 27 mg/dL — ABNORMAL HIGH (ref 6–20)
BUN: 21 mg/dL — ABNORMAL HIGH (ref 6–20)
BUN: 14 mg/dL (ref 6–20)
BUN: 12 mg/dL (ref 6–20)
BUN: 10 mg/dL (ref 6–20)
BUN: 11 mg/dL (ref 6–20)
CALCIUM: 8.2 mg/dL — AB (ref 8.9–10.3)
CALCIUM: 8.3 mg/dL — AB (ref 8.9–10.3)
CALCIUM: 8 mg/dL — AB (ref 8.9–10.3)
CALCIUM: 8.3 mg/dL — AB (ref 8.9–10.3)
CO2: 10 mmol/L — AB (ref 22–32)
CO2: 14 mmol/L — AB (ref 22–32)
CO2: 16 mmol/L — AB (ref 22–32)
CO2: 16 mmol/L — AB (ref 22–32)
CO2: 13 mmol/L — AB (ref 22–32)
CREATININE: 2.33 mg/dL — AB (ref 0.61–1.24)
CREATININE: 2.03 mg/dL — AB (ref 0.61–1.24)
CREATININE: 1.75 mg/dL — AB (ref 0.61–1.24)
CREATININE: 1.5 mg/dL — AB (ref 0.61–1.24)
CREATININE: 1.35 mg/dL — AB (ref 0.61–1.24)
Calcium: 8.4 mg/dL — ABNORMAL LOW (ref 8.9–10.3)
Calcium: 8.3 mg/dL — ABNORMAL LOW (ref 8.9–10.3)
Chloride: 120 mmol/L — ABNORMAL HIGH (ref 101–111)
Chloride: 116 mmol/L — ABNORMAL HIGH (ref 101–111)
Chloride: 113 mmol/L — ABNORMAL HIGH (ref 101–111)
Chloride: 113 mmol/L — ABNORMAL HIGH (ref 101–111)
Chloride: 108 mmol/L (ref 101–111)
Chloride: 111 mmol/L (ref 101–111)
Creatinine, Ser: 1.46 mg/dL — ABNORMAL HIGH (ref 0.61–1.24)
GFR calc non Af Amer: 42 mL/min — ABNORMAL LOW (ref >60)
GFR, EST AFRICAN AMERICAN: 41 mL/min — AB (ref >60)
GFR, EST AFRICAN AMERICAN: 49 mL/min — AB (ref >60)
GFR, EST AFRICAN AMERICAN: 58 mL/min — AB (ref >60)
GFR, EST NON AFRICAN AMERICAN: 36 mL/min — AB (ref >60)
GFR, EST NON AFRICAN AMERICAN: 50 mL/min — AB (ref >60)
GLUCOSE: 204 mg/dL — AB (ref 65–99)
Glucose, Bld: 321 mg/dL — ABNORMAL HIGH (ref 65–99)
Glucose, Bld: 194 mg/dL — ABNORMAL HIGH (ref 65–99)
Glucose, Bld: 154 mg/dL — ABNORMAL HIGH (ref 65–99)
Glucose, Bld: 153 mg/dL — ABNORMAL HIGH (ref 65–99)
Glucose, Bld: 189 mg/dL — ABNORMAL HIGH (ref 65–99)
Potassium: 5.3 mmol/L — ABNORMAL HIGH (ref 3.5–5.1)
Potassium: 4.5 mmol/L (ref 3.5–5.1)
Potassium: 3.9 mmol/L (ref 3.5–5.1)
Potassium: 3.5 mmol/L (ref 3.5–5.1)
Potassium: 3.4 mmol/L — ABNORMAL LOW (ref 3.5–5.1)
Potassium: 4.2 mmol/L (ref 3.5–5.1)
SODIUM: 145 mmol/L (ref 135–145)
SODIUM: 141 mmol/L (ref 135–145)
Sodium: 143 mmol/L (ref 135–145)
Sodium: 145 mmol/L (ref 135–145)
Sodium: 137 mmol/L (ref 135–145)
Sodium: 140 mmol/L (ref 135–145)

## 2017-07-26 LAB — GLUCOSE, CAPILLARY
GLUCOSE-CAPILLARY: 146 mg/dL — AB (ref 65–99)
GLUCOSE-CAPILLARY: 138 mg/dL — AB (ref 65–99)
GLUCOSE-CAPILLARY: 147 mg/dL — AB (ref 65–99)
GLUCOSE-CAPILLARY: 126 mg/dL — AB (ref 65–99)
GLUCOSE-CAPILLARY: 140 mg/dL — AB (ref 65–99)
GLUCOSE-CAPILLARY: 146 mg/dL — AB (ref 65–99)
GLUCOSE-CAPILLARY: 139 mg/dL — AB (ref 65–99)
GLUCOSE-CAPILLARY: 168 mg/dL — AB (ref 65–99)
Glucose-Capillary: 138 mg/dL — ABNORMAL HIGH (ref 65–99)
Glucose-Capillary: 147 mg/dL — ABNORMAL HIGH (ref 65–99)
Glucose-Capillary: 152 mg/dL — ABNORMAL HIGH (ref 65–99)
Glucose-Capillary: 156 mg/dL — ABNORMAL HIGH (ref 65–99)
Glucose-Capillary: 159 mg/dL — ABNORMAL HIGH (ref 65–99)
Glucose-Capillary: 163 mg/dL — ABNORMAL HIGH (ref 65–99)
Glucose-Capillary: 168 mg/dL — ABNORMAL HIGH (ref 65–99)
Glucose-Capillary: 169 mg/dL — ABNORMAL HIGH (ref 65–99)
Glucose-Capillary: 171 mg/dL — ABNORMAL HIGH (ref 65–99)
Glucose-Capillary: 191 mg/dL — ABNORMAL HIGH (ref 65–99)
Glucose-Capillary: 196 mg/dL — ABNORMAL HIGH (ref 65–99)
Glucose-Capillary: 199 mg/dL — ABNORMAL HIGH (ref 65–99)
Glucose-Capillary: 259 mg/dL — ABNORMAL HIGH (ref 65–99)
Glucose-Capillary: 323 mg/dL — ABNORMAL HIGH (ref 65–99)

## 2017-07-26 LAB — MRSA PCR SCREENING: MRSA by PCR: NEGATIVE

## 2017-07-26 MED ORDER — LIVING WELL WITH DIABETES BOOK
Freq: Once | Status: AC
  Administered 2017-07-27: 01:00:00
  Filled 2017-07-26: qty 1

## 2017-07-26 MED ORDER — ONDANSETRON HCL 4 MG/2ML IJ SOLN: 4.0000 mg | Freq: Four times a day (QID) | INTRAMUSCULAR | Status: DC | PRN

## 2017-07-26 MED ORDER — ENOXAPARIN SODIUM 40 MG/0.4ML ~~LOC~~ SOLN: 40.0000 mg | SUBCUTANEOUS | Status: DC

## 2017-07-26 MED ORDER — MENTHOL 3 MG MT LOZG
1.0000 | LOZENGE | OROMUCOSAL | Status: DC | PRN
  Administered 2017-07-26 – 2017-07-27 (×2): 3 mg via ORAL
  Filled 2017-07-26 (×2): qty 9

## 2017-07-26 MED ORDER — SODIUM CHLORIDE 0.9 % IV SOLN
INTRAVENOUS | Status: DC
  Administered 2017-07-26 – 2017-07-28 (×5): via INTRAVENOUS

## 2017-07-26 NOTE — Progress Notes (Signed)
Patient given Lozenge for sore thoat
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
## 2017-07-26 NOTE — Plan of Care (Signed)
  RD consulted for nutrition education regarding diabetes.   Spoke with patient who was suffering from nausea during visit. He is not very verbal with RD but seems to be comprehending information provided and discussed. Provided diet ginger ale to help with nausea. Discussed carb counting and plate method. Will follow-up when he is hopefully feeling better to help patient comprehend.  Lab Results  Component Value Date   HGBA1C 13.2 (H) 07/25/2017    RD provided "Carbohydrate Counting for People with Diabetes" handout from the Academy of Nutrition and Dietetics. Discussed different food groups and their effects on blood sugar, emphasizing carbohydrate-containing foods. Provided list of carbohydrates and recommended serving sizes of common foods.  Discussed importance of controlled and consistent carbohydrate intake throughout the day. Provided examples of ways to balance meals/snacks and encouraged intake of high-fiber, whole grain complex carbohydrates. Teach back method used.  Expect fair compliance.  Body mass index is 32.78 kg/m. Pt meets criteria for obese class I based on current BMI.  Current diet order is carb modified, patient is consuming approximately 50% of meals at this time. Labs and medications reviewed. No further nutrition interventions warranted at this time. RD contact information provided. If additional nutrition issues arise, please re-consult RD.  Satira Anis. Saajan Willmon, MS, RD LDN Inpatient Clinical Dietitian Pager [phone]

## 2017-07-26 NOTE — Progress Notes (Addendum)
Inpatient Diabetes Program Recommendations  AACE/ADA: New Consensus Statement on Inpatient Glycemic Control (2015)  Target Ranges:  Prepandial:   less than 140 mg/dL      Peak postprandial:   less than 180 mg/dL (1-2 hours)      Critically ill patients:  140 - 180 mg/dL  Results for Carlos Lawrence, Carlos Lawrence (MRN [id number]) as of 07/26/2017 08:26  Ref. Range 07/26/2017 00:22 07/26/2017 01:25 07/26/2017 02:30 07/26/2017 03:25 07/26/2017 04:26 07/26/2017 05:34 07/26/2017 06:31 07/26/2017 07:38  Glucose-Capillary Latest Ref Range: 65 - 99 mg/dL 259 (H) 191 (H) 196 (H) 168 (H) 146 (H) 138 (H) 147 (H) 126 (H)   Results for Carlos Lawrence, Carlos Lawrence (MRN [id number]) as of 07/26/2017 08:26  Ref. Range 07/25/2017 17:20 07/25/2017 17:30 07/25/2017 19:24 07/25/2017 19:49  Glucose Latest Ref Range: 65 - 99 mg/dL 962 (HH) >700 (HH) 766 (HH)   Hemoglobin A1C Latest Ref Range: 4.8 - 5.6 %    13.2 (H)  Results for Carlos Lawrence, Carlos Lawrence (MRN [id number]) as of 07/26/2017 08:26  Ref. Range 07/26/2017 07:13  CO2 Latest Ref Range: 22 - 32 mmol/L 14 (L)  Results for Carlos Lawrence, Carlos Lawrence (MRN [id number]) as of 07/26/2017 08:26  Ref. Range 07/26/2017 07:13  Anion gap Latest Ref Range: 5 - 15  18 (H)   Review of Glycemic Control  Diabetes history: NO Outpatient Diabetes medications: NA Current orders for Inpatient glycemic control: IV insulin drip per DKA protocol  Inpatient Diabetes Program Recommendations:  Insulin - IV drip/GlucoStabilizer: Per labs at 7:13 am today, patient remains acidotic and will require IV insulin drip until acidosis has resolved as determined by MD (CO2 >20 and AG <10-12). Insulin - Basal: At time of transition from IV to SQ insulin, please consider ordering Lantus 25 units Q24H (based on 97 kg x 0.25 units). Correction (SSI): At time of transition from IV to SQ insulin, please consider ordering CBGs with Novolog 0-9 units Q4H. HgbA1C: A1C 13.2% on 07/25/17 indicating an average glucose of 332 mg/dl over the past 2-3  months. Patient will most likely require insulin as an outpatient for DM control.  NOTE: Noted patient is from jail and has no prior DM history. Initial glucose 962 mg/dl at 17:20 on 07/25/17 and patient was admitted with DKA and ordered IV insulin drip per protocol. Patient's glucose has improved but patient remains acidotic at this time per last labs at 7:13 am today. Patient will need to stay on IV insulin drip until acidosis is resolved as determined by MD (CO2 >20 and AG <10-12).  Ordered: Living Well with Diabetes book, RD consult for diet education, DM education videos, and bedside nursing to provide DM and insulin administration education. Will plan to talk with patient today.  Addendum 07/26/17@12 :47- Went to talk to patient regarding new DM dx. Patient lying in bed with eyes open. In talking with patient he was very slow to respond to general questions and at times just stared out and did not answer questions. Patient appeared to be fixated on getting something to drink and eat and kept asking for food and drink. Asked Curator in the room if patient appeared to be at his baseline mentally and he reported that he was covering for prior office to take a lunch break and the other officer would be more familiar with his baseline. Informed patient that he should be getting some educational material on DM and asked patient to begin reading Living Well with DM book when he receives it.  Called  [facility] jail to inquire about insulins and was informed that the available insulins are Regular, 70/30, and NPH. The nurse at the jail asked that at the time of discharge, please send all discharge information by fax to the jail at [phone] Attention: Medical.  Patient will need to be discharged on 70/30, or NPH, and/or Regular. Will plan for Diabetes Coordinator to follow up with patient tomorrow.  Thanks, Barnie Alderman, RN, MSN, CDE Diabetes Coordinator Inpatient Diabetes  Program [phone] (Team Pager from 8am to 5pm)

## 2017-07-26 NOTE — Progress Notes (Signed)
Patient continues to have guards at bedside for monitoring. Insulin drip being titrated appropriately. Patient changed from NPO status to Carb. Modified diet, patient appears less agitated and resting more comfortably.

## 2017-07-27 DIAGNOSIS — R0600 Dyspnea, unspecified: Secondary | ICD-10-CM

## 2017-07-27 DIAGNOSIS — J029 Acute pharyngitis, unspecified: Secondary | ICD-10-CM

## 2017-07-27 LAB — GLUCOSE, CAPILLARY
GLUCOSE-CAPILLARY: 219 mg/dL — AB (ref 65–99)
GLUCOSE-CAPILLARY: 147 mg/dL — AB (ref 65–99)
GLUCOSE-CAPILLARY: 125 mg/dL — AB (ref 65–99)
GLUCOSE-CAPILLARY: 143 mg/dL — AB (ref 65–99)
GLUCOSE-CAPILLARY: 152 mg/dL — AB (ref 65–99)
GLUCOSE-CAPILLARY: 355 mg/dL — AB (ref 65–99)
GLUCOSE-CAPILLARY: 211 mg/dL — AB (ref 65–99)
GLUCOSE-CAPILLARY: 234 mg/dL — AB (ref 65–99)
GLUCOSE-CAPILLARY: 123 mg/dL — AB (ref 65–99)
GLUCOSE-CAPILLARY: 138 mg/dL — AB (ref 65–99)
Glucose-Capillary: 112 mg/dL — ABNORMAL HIGH (ref 65–99)
Glucose-Capillary: 124 mg/dL — ABNORMAL HIGH (ref 65–99)
Glucose-Capillary: 158 mg/dL — ABNORMAL HIGH (ref 65–99)
Glucose-Capillary: 160 mg/dL — ABNORMAL HIGH (ref 65–99)
Glucose-Capillary: 179 mg/dL — ABNORMAL HIGH (ref 65–99)
Glucose-Capillary: 186 mg/dL — ABNORMAL HIGH (ref 65–99)
Glucose-Capillary: 193 mg/dL — ABNORMAL HIGH (ref 65–99)
Glucose-Capillary: 219 mg/dL — ABNORMAL HIGH (ref 65–99)
Glucose-Capillary: 242 mg/dL — ABNORMAL HIGH (ref 65–99)
Glucose-Capillary: 274 mg/dL — ABNORMAL HIGH (ref 65–99)

## 2017-07-27 LAB — BASIC METABOLIC PANEL
ANION GAP: 13 (ref 5–15)
Anion gap: 11 (ref 5–15)
Anion gap: 12 (ref 5–15)
Anion gap: 10 (ref 5–15)
Anion gap: 8 (ref 5–15)
BUN: 6 mg/dL (ref 6–20)
BUN: 7 mg/dL (ref 6–20)
BUN: 6 mg/dL (ref 6–20)
BUN: 5 mg/dL — AB (ref 6–20)
CALCIUM: 8.3 mg/dL — AB (ref 8.9–10.3)
CALCIUM: 8 mg/dL — AB (ref 8.9–10.3)
CHLORIDE: 109 mmol/L (ref 101–111)
CHLORIDE: 109 mmol/L (ref 101–111)
CHLORIDE: 109 mmol/L (ref 101–111)
CO2: 20 mmol/L — AB (ref 22–32)
CO2: 18 mmol/L — AB (ref 22–32)
CO2: 19 mmol/L — AB (ref 22–32)
CO2: 20 mmol/L — AB (ref 22–32)
CO2: 21 mmol/L — AB (ref 22–32)
CREATININE: 1.21 mg/dL (ref 0.61–1.24)
CREATININE: 1.13 mg/dL (ref 0.61–1.24)
CREATININE: 0.94 mg/dL (ref 0.61–1.24)
Calcium: 8.1 mg/dL — ABNORMAL LOW (ref 8.9–10.3)
Calcium: 8.2 mg/dL — ABNORMAL LOW (ref 8.9–10.3)
Calcium: 8.1 mg/dL — ABNORMAL LOW (ref 8.9–10.3)
Chloride: 110 mmol/L (ref 101–111)
Chloride: 108 mmol/L (ref 101–111)
Creatinine, Ser: 1.28 mg/dL — ABNORMAL HIGH (ref 0.61–1.24)
Creatinine, Ser: 1.31 mg/dL — ABNORMAL HIGH (ref 0.61–1.24)
GFR calc Af Amer: >60 mL/min (ref >60)
GFR calc Af Amer: >60 mL/min (ref >60)
GFR calc Af Amer: >60 mL/min (ref >60)
GFR calc Af Amer: >60 mL/min (ref >60)
GFR calc non Af Amer: >60 mL/min (ref >60)
GFR calc non Af Amer: >60 mL/min (ref >60)
GFR calc non Af Amer: >60 mL/min (ref >60)
GFR calc non Af Amer: >60 mL/min (ref >60)
GFR calc non Af Amer: >60 mL/min (ref >60)
GLUCOSE: 140 mg/dL — AB (ref 65–99)
GLUCOSE: 248 mg/dL — AB (ref 65–99)
GLUCOSE: 244 mg/dL — AB (ref 65–99)
Glucose, Bld: 176 mg/dL — ABNORMAL HIGH (ref 65–99)
Glucose, Bld: 157 mg/dL — ABNORMAL HIGH (ref 65–99)
POTASSIUM: 3 mmol/L — AB (ref 3.5–5.1)
POTASSIUM: 3.1 mmol/L — AB (ref 3.5–5.1)
POTASSIUM: 3.2 mmol/L — AB (ref 3.5–5.1)
Potassium: 2.9 mmol/L — ABNORMAL LOW (ref 3.5–5.1)
Potassium: 2.9 mmol/L — ABNORMAL LOW (ref 3.5–5.1)
SODIUM: 139 mmol/L (ref 135–145)
SODIUM: 140 mmol/L (ref 135–145)
Sodium: 141 mmol/L (ref 135–145)
Sodium: 139 mmol/L (ref 135–145)
Sodium: 138 mmol/L (ref 135–145)

## 2017-07-27 LAB — RAPID STREP SCREEN (MED CTR MEBANE ONLY): Streptococcus, Group A Screen (Direct): NEGATIVE

## 2017-07-27 LAB — MAGNESIUM: MAGNESIUM: 2.1 mg/dL (ref 1.7–2.4)

## 2017-07-27 MED ORDER — POTASSIUM CHLORIDE CRYS ER 20 MEQ PO TBCR
40.0000 meq | EXTENDED_RELEASE_TABLET | ORAL | Status: AC
  Administered 2017-07-27 (×2): 40 meq via ORAL
  Filled 2017-07-27 (×2): qty 2

## 2017-07-27 MED ORDER — INSULIN REGULAR BOLUS VIA INFUSION
0.0000 [IU] | Freq: Three times a day (TID) | INTRAVENOUS | Status: DC
  Administered 2017-07-27: 2 [IU] via INTRAVENOUS
  Filled 2017-07-27: qty 10

## 2017-07-27 MED ORDER — INSULIN GLARGINE 100 UNIT/ML ~~LOC~~ SOLN
10.0000 [IU] | Freq: Every day | SUBCUTANEOUS | Status: DC
  Administered 2017-07-27: 10 [IU] via SUBCUTANEOUS
  Filled 2017-07-27 (×2): qty 0.1

## 2017-07-27 MED ORDER — POTASSIUM CHLORIDE CRYS ER 20 MEQ PO TBCR
40.0000 meq | EXTENDED_RELEASE_TABLET | Freq: Once | ORAL | Status: AC
  Administered 2017-07-27: 40 meq via ORAL
  Filled 2017-07-27: qty 2

## 2017-07-27 MED ORDER — FLUTICASONE PROPIONATE 50 MCG/ACT NA SUSP
2.0000 | Freq: Every day | NASAL | Status: DC
  Administered 2017-07-27 – 2017-07-29 (×3): 2 via NASAL
  Filled 2017-07-27 (×2): qty 16

## 2017-07-27 MED ORDER — OXYMETAZOLINE HCL 0.05 % NA SOLN
1.0000 | Freq: Two times a day (BID) | NASAL | Status: DC
Start: 2017-07-27 — End: 2017-07-29
  Administered 2017-07-27 – 2017-07-29 (×3): 1 via NASAL
  Filled 2017-07-27: qty 15

## 2017-07-27 MED ORDER — INSULIN GLARGINE 100 UNIT/ML ~~LOC~~ SOLN
20.0000 [IU] | Freq: Every day | SUBCUTANEOUS | Status: DC
  Filled 2017-07-27: qty 0.2

## 2017-07-27 NOTE — Progress Notes (Signed)
Inpatient Diabetes Program Recommendations  AACE/ADA: New Consensus Statement on Inpatient Glycemic Control (2015)  Target Ranges:  Prepandial:   less than 140 mg/dL      Peak postprandial:   less than 180 mg/dL (1-2 hours)      Critically ill patients:  140 - 180 mg/dL   Lab Results  Component Value Date   GLUCAP 179 (H) 07/27/2017   HGBA1C 13.2 (H) 07/25/2017    Review of Glycemic Control  Diabetes history: New Onset  Inpatient Diabetes Program Recommendations:  -Increase Lantus 25 units Q24H (based on 97 kg x 0.25 units).  If patient discharged on Novoling 70/30 insulin mix: 70/30 18 units bid = approx. 25  units basal insulin and 15 units meal coverage.  Spoke with RN and requested patient to start giving own injections and continue insulin injection teaching with patient. Met with patient @ bedside and discussed nutrition information but limited while patient is eating. Answering questions appropriately today and remembers reviewing nutrition questions with dietician today.  Thank you, Nani Gasser. Railey Glad, RN, MSN, CDE  Diabetes Coordinator Inpatient Glycemic Control Team Team Pager [phone] (8am-5pm) 07/27/2017 1:36 PM

## 2017-07-27 NOTE — Progress Notes (Signed)
Pt c/o sore throat and stuffy nose and head. Asked if he had allergies. Pt was not sure. Stated the throat lozenges was not working. Pt thinks he has a cold or the flu. Pt has no fever.

## 2017-07-27 NOTE — Progress Notes (Signed)
Nutrition Brief Note  RD checked in with patient to follow-up on previous education. He had no questions. Explained to patient the importance of blood sugar control on long term health and quality of life. He seemed receptive.   Satira Anis. Catlynn Grondahl, MS, RD LDN Inpatient Clinical Dietitian Pager [phone]
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
## 2017-07-28 ENCOUNTER — Inpatient Hospital Stay (HOSPITAL_COMMUNITY): Payer: Self-pay

## 2017-07-28 DIAGNOSIS — E876 Hypokalemia: Secondary | ICD-10-CM

## 2017-07-28 DIAGNOSIS — J209 Acute bronchitis, unspecified: Secondary | ICD-10-CM

## 2017-07-28 LAB — BASIC METABOLIC PANEL
Anion gap: 10 (ref 5–15)
Anion gap: 14 (ref 5–15)
Anion gap: 9 (ref 5–15)
Anion gap: 10 (ref 5–15)
CALCIUM: 8.1 mg/dL — AB (ref 8.9–10.3)
CALCIUM: 8 mg/dL — AB (ref 8.9–10.3)
CALCIUM: 8.2 mg/dL — AB (ref 8.9–10.3)
CHLORIDE: 109 mmol/L (ref 101–111)
CHLORIDE: 105 mmol/L (ref 101–111)
CHLORIDE: 105 mmol/L (ref 101–111)
CO2: 20 mmol/L — ABNORMAL LOW (ref 22–32)
CO2: 22 mmol/L (ref 22–32)
CO2: 22 mmol/L (ref 22–32)
CO2: 24 mmol/L (ref 22–32)
CREATININE: 0.96 mg/dL (ref 0.61–1.24)
CREATININE: 1.01 mg/dL (ref 0.61–1.24)
CREATININE: 0.93 mg/dL (ref 0.61–1.24)
CREATININE: 0.85 mg/dL (ref 0.61–1.24)
Calcium: 8.3 mg/dL — ABNORMAL LOW (ref 8.9–10.3)
Chloride: 104 mmol/L (ref 101–111)
GFR calc Af Amer: >60 mL/min (ref >60)
GFR calc non Af Amer: >60 mL/min (ref >60)
GFR calc non Af Amer: >60 mL/min (ref >60)
GFR calc non Af Amer: >60 mL/min (ref >60)
GFR calc non Af Amer: >60 mL/min (ref >60)
GLUCOSE: 90 mg/dL (ref 65–99)
Glucose, Bld: 245 mg/dL — ABNORMAL HIGH (ref 65–99)
Glucose, Bld: 243 mg/dL — ABNORMAL HIGH (ref 65–99)
Glucose, Bld: 303 mg/dL — ABNORMAL HIGH (ref 65–99)
Potassium: 3.1 mmol/L — ABNORMAL LOW (ref 3.5–5.1)
Potassium: 3.1 mmol/L — ABNORMAL LOW (ref 3.5–5.1)
Potassium: 3.1 mmol/L — ABNORMAL LOW (ref 3.5–5.1)
Potassium: 3.4 mmol/L — ABNORMAL LOW (ref 3.5–5.1)
SODIUM: 141 mmol/L (ref 135–145)
SODIUM: 138 mmol/L (ref 135–145)
SODIUM: 136 mmol/L (ref 135–145)
SODIUM: 139 mmol/L (ref 135–145)

## 2017-07-28 LAB — IRON AND TIBC
Iron: 120 ug/dL (ref 45–182)
SATURATION RATIOS: 52 % — AB (ref 17.9–39.5)
TIBC: 232 ug/dL — ABNORMAL LOW (ref 250–450)
UIBC: 112 ug/dL

## 2017-07-28 LAB — CBC
HCT: 36.3 % — ABNORMAL LOW (ref 39.0–52.0)
HEMOGLOBIN: 12.6 g/dL — AB (ref 13.0–17.0)
MCH: 25.9 pg — AB (ref 26.0–34.0)
MCHC: 34.7 g/dL (ref 30.0–36.0)
MCV: 74.5 fL — ABNORMAL LOW (ref 78.0–100.0)
Platelets: 156 10*3/uL (ref 150–400)
RBC: 4.87 MIL/uL (ref 4.22–5.81)
RDW: 14.8 % (ref 11.5–15.5)
WBC: 5.4 10*3/uL (ref 4.0–10.5)

## 2017-07-28 LAB — RETICULOCYTES
RBC.: 4.87 MIL/uL (ref 4.22–5.81)
RETIC CT PCT: 0.5 % (ref 0.4–3.1)
Retic Count, Absolute: 24.4 10*3/uL (ref 19.0–186.0)

## 2017-07-28 LAB — GLUCOSE, CAPILLARY
GLUCOSE-CAPILLARY: 250 mg/dL — AB (ref 65–99)
GLUCOSE-CAPILLARY: 251 mg/dL — AB (ref 65–99)
GLUCOSE-CAPILLARY: 92 mg/dL (ref 65–99)
GLUCOSE-CAPILLARY: 97 mg/dL (ref 65–99)
Glucose-Capillary: 161 mg/dL — ABNORMAL HIGH (ref 65–99)
Glucose-Capillary: 273 mg/dL — ABNORMAL HIGH (ref 65–99)
Glucose-Capillary: 238 mg/dL — ABNORMAL HIGH (ref 65–99)

## 2017-07-28 LAB — HIV ANTIBODY (ROUTINE TESTING W REFLEX): HIV SCREEN 4TH GENERATION: NONREACTIVE

## 2017-07-28 LAB — FERRITIN: Ferritin: 229 ng/mL (ref 24–336)

## 2017-07-28 LAB — MAGNESIUM: MAGNESIUM: 1.9 mg/dL (ref 1.7–2.4)

## 2017-07-28 MED ORDER — INSULIN GLARGINE 100 UNIT/ML ~~LOC~~ SOLN
20.0000 [IU] | Freq: Every day | SUBCUTANEOUS | Status: DC
  Administered 2017-07-28: 20 [IU] via SUBCUTANEOUS
  Filled 2017-07-28: qty 0.2

## 2017-07-28 MED ORDER — POTASSIUM CHLORIDE CRYS ER 20 MEQ PO TBCR
40.0000 meq | EXTENDED_RELEASE_TABLET | ORAL | Status: AC
  Administered 2017-07-28 (×2): 40 meq via ORAL
  Filled 2017-07-28 (×2): qty 2

## 2017-07-28 MED ORDER — LOSARTAN POTASSIUM 50 MG PO TABS
25.0000 mg | ORAL_TABLET | Freq: Every day | ORAL | Status: DC
Start: 2017-07-28 — End: 2017-07-29
  Administered 2017-07-28 – 2017-07-29 (×2): 25 mg via ORAL
  Filled 2017-07-28 (×2): qty 1

## 2017-07-28 MED ORDER — SODIUM CHLORIDE 0.45 % IV SOLN: INTRAVENOUS | Status: DC

## 2017-07-28 MED ORDER — INSULIN ASPART 100 UNIT/ML ~~LOC~~ SOLN
0.0000 [IU] | Freq: Three times a day (TID) | SUBCUTANEOUS | Status: DC
  Administered 2017-07-28: 8 [IU] via SUBCUTANEOUS
  Administered 2017-07-28 (×2): 5 [IU] via SUBCUTANEOUS

## 2017-07-28 MED ORDER — AZITHROMYCIN 250 MG PO TABS
250.0000 mg | ORAL_TABLET | Freq: Every day | ORAL | Status: DC
  Administered 2017-07-29: 250 mg via ORAL
  Filled 2017-07-28: qty 1

## 2017-07-28 MED ORDER — INSULIN ASPART 100 UNIT/ML ~~LOC~~ SOLN
0.0000 [IU] | Freq: Every day | SUBCUTANEOUS | Status: DC
  Administered 2017-07-28: 3 [IU] via SUBCUTANEOUS

## 2017-07-28 MED ORDER — INSULIN GLARGINE 100 UNIT/ML ~~LOC~~ SOLN: 30.0000 [IU] | Freq: Every day | SUBCUTANEOUS | Status: DC

## 2017-07-28 MED ORDER — INSULIN GLARGINE 100 UNIT/ML ~~LOC~~ SOLN
30.0000 [IU] | Freq: Every day | SUBCUTANEOUS | Status: DC
  Filled 2017-07-28: qty 0.3

## 2017-07-28 MED ORDER — GLUCERNA SHAKE PO LIQD
237.0000 mL | Freq: Three times a day (TID) | ORAL | Status: DC
  Administered 2017-07-28 – 2017-07-29 (×4): 237 mL via ORAL

## 2017-07-28 MED ORDER — AZITHROMYCIN 500 MG PO TABS
500.0000 mg | ORAL_TABLET | Freq: Every day | ORAL | Status: AC
  Administered 2017-07-28: 500 mg via ORAL
  Filled 2017-07-28: qty 1

## 2017-07-28 NOTE — Progress Notes (Signed)
Inpatient Diabetes Program Recommendations  AACE/ADA: New Consensus Statement on Inpatient Glycemic Control (2015)  Target Ranges:  Prepandial:   less than 140 mg/dL      Peak postprandial:   less than 180 mg/dL (1-2 hours)      Critically ill patients:  140 - 180 mg/dL   Results for KNUT, RONDINELLI (MRN [id number]) as of 07/28/2017 14:38  Ref. Range 07/28/2017 01:04 07/28/2017 02:27 07/28/2017 06:09 07/28/2017 11:51  Glucose-Capillary Latest Ref Range: 65 - 99 mg/dL 92 161 (H) 250 (H) 273 (H)  Results for ASEEL, UHDE (MRN [id number]) as of 07/28/2017 14:38  Ref. Range 07/27/2017 07:09 07/27/2017 08:09 07/27/2017 09:20 07/27/2017 10:24 07/27/2017 11:17 07/27/2017 12:36 07/27/2017 13:37 07/27/2017 14:42 07/27/2017 15:44 07/27/2017 16:49 07/27/2017 17:48 07/27/2017 18:45 07/27/2017 20:17 07/27/2017 21:56 07/27/2017 23:07 07/27/2017 23:58  Glucose-Capillary Latest Ref Range: 65 - 99 mg/dL 158 (H) 152 (H) 355 (H) 274 (H) 242 (H) 179 (H) 211 (H) 234 (H) 123 (H) 160 (H) 193 (H) 186 (H) 112 (H) 219 (H) 138 (H) 97   Review of Glycemic Control  Diabetes history: No Outpatient Diabetes medications: NA Current orders for Inpatient glycemic control: Lantus 30 units daily, Novolog 0-15 units TID with meals, Novolog 0-5 units QHS   Inpatient Diabetes Program Recommendations:  Insulin - Basal: Noted patient was transitioned off IV insulin today around 2am and was given Lantus 20 units at 8:11 am today. Also noted that Lantus was increased to 30 units daily starting 07/28/17.  Correction (SSI): At time of transition from IV to SQ insulin, please consider ordering CBGs with Novolog 0-9 units Q4H. HgbA1C: A1C 13.4% on 07/25/17. New DM dx and will be discharging new to insulin. Outpatient DM plan: Since patient will be returning to jail after discharge, patient will need to be transitioned to 70/30, NPH, and/or Regular as these are the available insulin at the jail per nurse there. Based on current insulin orders, recommend transitioning  to 70/30 20 units BID (would provide a total of 28 units for basal and 12 units for meal coverage per day).    NOTE: Spoke with patient about new diabetes diagnosis again.  Asked patient several questions about information that has been discussed already and patient is not able to correctly answer questions appropriately.  Discussed A1C results (13.2% on 07/25/17) and explained what an A1C is and informed patient that his current A1C indicates an average glucose of 332 mg/dl over the past 2-3 months. Discussed basic pathophysiology of DM, basic home care, importance of checking CBGs and maintaining good CBG control to prevent long-term and short-term complications. Reviewed glucose and A1C goals.  Reviewed signs and symptoms of hyperglycemia and hypoglycemia along with treatment for both. Encouraged patient to be sure to let the guards at the jail know if he felt he was getting any symptoms of hypoglycemia so that glucose can be checked.  Discussed impact of nutrition, exercise, stress, sickness, and medications on diabetes control. Encouraged patient to read through entire Living Well with DM book and insulin starter kit. Informed patient that he will be prescribed 70/30, Regular, and/or NPH insulin since they are more affordable insulin and these are the insulins available at the jail.  Informed patient that Novolin 70/30, NPH, and Regular can be purchased at [facility] for $25 per vial.  Informed patient that RN will be asking him to self-administer insulin to ensure proper technique and ability to administer self insulin shots. Guards with patient reports that patient will be provided with  Carb Modified diet at the jail and the nurse there will draw up the insulin and allow patient to self administer insulin.   Patient verbalized understanding of information discussed and he states that he has no further questions at this time related to diabetes.   RNs to provide ongoing basic DM education at bedside with this  patient and engage patient to actively check blood glucose and administer insulin injections. Talked with RN and asked that patient be educated on insulin and allowed to self inject insulin.  Thanks, Barnie Alderman, RN, MSN, CDE Diabetes Coordinator Inpatient Diabetes Program [phone] (Team Pager from 8am to 5pm)

## 2017-07-28 NOTE — Progress Notes (Signed)
ICS given to Pt. Pt running a low grade fever. Educated on the purpose. Level reached was 1000. Suction set up in room. Coughing up thick pale yellow secretions.
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
## 2017-07-28 NOTE — Progress Notes (Signed)
Taught patient on how to inject insulin on his abdomen. He verbalized and demonstrated his understanding.

## 2017-07-28 NOTE — Care Management Note (Signed)
Case Management Note  Patient Details  Name: LINC RENNE MRN: [id number] Date of Birth: 08/27/1985  Subjective/Objective:       Pt in with DKA from [facility] prison.              Action/Plan: Plan is for patient to return to prison when medically ready for d/c. Per guard at the bedside the facility he is presently incarcerated can provide insulin injections for the patient. CM following.   Expected Discharge Date:                  Expected Discharge Plan:  Corrections Facility  In-House Referral:     Discharge planning Services  CM Consult  Post Acute Care Choice:    Choice offered to:     DME Arranged:    DME Agency:     HH Arranged:    Reform Agency:     Status of Service:  In process, will continue to follow  If discussed at Long Length of Stay Meetings, dates discussed:    Additional Comments:  Pollie Friar, RN 07/28/2017, 1:41 PM

## 2017-07-29 DIAGNOSIS — R0982 Postnasal drip: Secondary | ICD-10-CM

## 2017-07-29 DIAGNOSIS — E1110 Type 2 diabetes mellitus with ketoacidosis without coma: Principal | ICD-10-CM

## 2017-07-29 DIAGNOSIS — R05 Cough: Secondary | ICD-10-CM

## 2017-07-29 LAB — GLUCOSE, CAPILLARY
GLUCOSE-CAPILLARY: 260 mg/dL — AB (ref 65–99)
Glucose-Capillary: 276 mg/dL — ABNORMAL HIGH (ref 65–99)
Glucose-Capillary: 234 mg/dL — ABNORMAL HIGH (ref 65–99)

## 2017-07-29 LAB — BASIC METABOLIC PANEL
Anion gap: 10 (ref 5–15)
CALCIUM: 8.4 mg/dL — AB (ref 8.9–10.3)
CO2: 24 mmol/L (ref 22–32)
Chloride: 103 mmol/L (ref 101–111)
Creatinine, Ser: 0.85 mg/dL (ref 0.61–1.24)
GFR calc Af Amer: >60 mL/min (ref >60)
GLUCOSE: 253 mg/dL — AB (ref 65–99)
POTASSIUM: 3.4 mmol/L — AB (ref 3.5–5.1)
Sodium: 137 mmol/L (ref 135–145)

## 2017-07-29 LAB — VITAMIN B12: Vitamin B-12: 426 pg/mL (ref 180–914)

## 2017-07-29 LAB — CULTURE, GROUP A STREP (THRC)

## 2017-07-29 LAB — FOLATE: Folate: 10.8 ng/mL (ref >5.9)

## 2017-07-29 MED ORDER — LOSARTAN POTASSIUM 25 MG PO TABS: 25.0000 mg | ORAL_TABLET | Freq: Every day | ORAL | 0 refills | Status: DC

## 2017-07-29 MED ORDER — INSULIN NPH ISOPHANE & REGULAR (70-30) 100 UNIT/ML ~~LOC~~ SUSP: 28.0000 [IU] | Freq: Two times a day (BID) | SUBCUTANEOUS | 11 refills | Status: DC

## 2017-07-29 MED ORDER — INSULIN ASPART PROT & ASPART (70-30 MIX) 100 UNIT/ML ~~LOC~~ SUSP
28.0000 [IU] | Freq: Two times a day (BID) | SUBCUTANEOUS | Status: DC
  Administered 2017-07-29 (×2): 28 [IU] via SUBCUTANEOUS
  Filled 2017-07-29: qty 10

## 2017-07-29 MED ORDER — AZITHROMYCIN 250 MG PO TABS: ORAL_TABLET | ORAL | 0 refills | Status: DC

## 2017-07-29 MED ORDER — INSULIN ASPART PROT & ASPART (70-30 MIX) 100 UNIT/ML ~~LOC~~ SUSP: 28.0000 [IU] | Freq: Two times a day (BID) | SUBCUTANEOUS | 11 refills | Status: DC

## 2017-07-29 MED ORDER — HYDRALAZINE HCL 20 MG/ML IJ SOLN
5.0000 mg | Freq: Once | INTRAMUSCULAR | Status: AC
  Administered 2017-07-29: 5 mg via INTRAVENOUS
  Filled 2017-07-29: qty 1

## 2017-07-29 MED ORDER — MENTHOL 3 MG MT LOZG: 1.0000 | LOZENGE | OROMUCOSAL | 12 refills | Status: DC | PRN

## 2017-07-29 MED ORDER — POTASSIUM CHLORIDE CRYS ER 20 MEQ PO TBCR
40.0000 meq | EXTENDED_RELEASE_TABLET | ORAL | Status: AC
  Administered 2017-07-29 (×2): 40 meq via ORAL
  Filled 2017-07-29 (×2): qty 2

## 2017-07-29 MED ORDER — FLUTICASONE PROPIONATE 50 MCG/ACT NA SUSP: 2.0000 | Freq: Every day | NASAL | 0 refills | Status: DC

## 2017-07-29 MED ORDER — GLUCERNA SHAKE PO LIQD: 237.0000 mL | Freq: Three times a day (TID) | ORAL | 0 refills | Status: DC

## 2017-07-29 NOTE — Progress Notes (Signed)
Orders placed for elevated Diastolic BP. Will recheck BP after.
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
## 2017-12-01 ENCOUNTER — Encounter (HOSPITAL_COMMUNITY): Payer: Self-pay | Admitting: Emergency Medicine

## 2017-12-01 ENCOUNTER — Emergency Department (HOSPITAL_COMMUNITY)
Admission: EM | Admit: 2017-12-01 | Discharge: 2017-12-01 | Disposition: A | Discharge status: Home / Self Care | Payer: Self-pay | Attending: Emergency Medicine | Admitting: Emergency Medicine

## 2017-12-01 ENCOUNTER — Other Ambulatory Visit: Payer: Self-pay

## 2017-12-01 DIAGNOSIS — Z79899 Other long term (current) drug therapy: Secondary | ICD-10-CM | POA: Insufficient documentation

## 2017-12-01 DIAGNOSIS — J45909 Unspecified asthma, uncomplicated: Secondary | ICD-10-CM | POA: Insufficient documentation

## 2017-12-01 DIAGNOSIS — R631 Polydipsia: Secondary | ICD-10-CM | POA: Insufficient documentation

## 2017-12-01 DIAGNOSIS — Z86718 Personal history of other venous thrombosis and embolism: Secondary | ICD-10-CM | POA: Insufficient documentation

## 2017-12-01 DIAGNOSIS — I10 Essential (primary) hypertension: Secondary | ICD-10-CM | POA: Insufficient documentation

## 2017-12-01 DIAGNOSIS — R3589 Other polyuria: Secondary | ICD-10-CM

## 2017-12-01 DIAGNOSIS — R42 Dizziness and giddiness: Secondary | ICD-10-CM | POA: Insufficient documentation

## 2017-12-01 DIAGNOSIS — Z794 Long term (current) use of insulin: Secondary | ICD-10-CM | POA: Insufficient documentation

## 2017-12-01 DIAGNOSIS — R358 Other polyuria: Secondary | ICD-10-CM | POA: Insufficient documentation

## 2017-12-01 DIAGNOSIS — E1165 Type 2 diabetes mellitus with hyperglycemia: Secondary | ICD-10-CM | POA: Insufficient documentation

## 2017-12-01 DIAGNOSIS — F17210 Nicotine dependence, cigarettes, uncomplicated: Secondary | ICD-10-CM | POA: Insufficient documentation

## 2017-12-01 HISTORY — DX: Type 2 diabetes mellitus without complications: E11.9

## 2017-12-01 LAB — CBC WITH DIFFERENTIAL/PLATELET
Abs Immature Granulocytes: 0 10*3/uL (ref 0.0–0.1)
BASOS PCT: 0 %
Basophils Absolute: 0 10*3/uL (ref 0.0–0.1)
EOS ABS: 0.1 10*3/uL (ref 0.0–0.7)
Eosinophils Relative: 1 %
HCT: 42.7 % (ref 39.0–52.0)
Hemoglobin: 14.3 g/dL (ref 13.0–17.0)
IMMATURE GRANULOCYTES: 0 %
Lymphocytes Relative: 21 %
Lymphs Abs: 1.8 10*3/uL (ref 0.7–4.0)
MCH: 25 pg — ABNORMAL LOW (ref 26.0–34.0)
MCHC: 33.5 g/dL (ref 30.0–36.0)
MCV: 74.5 fL — ABNORMAL LOW (ref 78.0–100.0)
MONOS PCT: 6 %
Monocytes Absolute: 0.5 10*3/uL (ref 0.1–1.0)
NEUTROS PCT: 72 %
Neutro Abs: 6.2 10*3/uL (ref 1.7–7.7)
PLATELETS: 311 10*3/uL (ref 150–400)
RBC: 5.73 MIL/uL (ref 4.22–5.81)
RDW: 15 % (ref 11.5–15.5)
WBC: 8.6 10*3/uL (ref 4.0–10.5)

## 2017-12-01 LAB — BASIC METABOLIC PANEL
Anion gap: 11 (ref 5–15)
BUN: 11 mg/dL (ref 6–20)
CALCIUM: 9 mg/dL (ref 8.9–10.3)
CO2: 21 mmol/L — ABNORMAL LOW (ref 22–32)
CREATININE: 0.99 mg/dL (ref 0.61–1.24)
Chloride: 99 mmol/L (ref 98–111)
GFR calc non Af Amer: >60 mL/min (ref >60)
Glucose, Bld: 450 mg/dL — ABNORMAL HIGH (ref 70–99)
Potassium: 4.4 mmol/L (ref 3.5–5.1)
SODIUM: 131 mmol/L — AB (ref 135–145)

## 2017-12-01 LAB — I-STAT CHEM 8, ED
BUN: 14 mg/dL (ref 6–20)
CALCIUM ION: 1.1 mmol/L — AB (ref 1.15–1.40)
CHLORIDE: 101 mmol/L (ref 98–111)
Creatinine, Ser: 0.8 mg/dL (ref 0.61–1.24)
Glucose, Bld: 459 mg/dL — ABNORMAL HIGH (ref 70–99)
HCT: 46 % (ref 39.0–52.0)
HEMOGLOBIN: 15.6 g/dL (ref 13.0–17.0)
Potassium: 4.6 mmol/L (ref 3.5–5.1)
SODIUM: 133 mmol/L — AB (ref 135–145)
TCO2: 23 mmol/L (ref 22–32)

## 2017-12-01 LAB — I-STAT VENOUS BLOOD GAS, ED
ACID-BASE DEFICIT: 4 mmol/L — AB (ref 0.0–2.0)
Bicarbonate: 20.9 mmol/L (ref 20.0–28.0)
O2 Saturation: 95 %
PCO2 VEN: 37.8 mmHg — AB (ref 44.0–60.0)
TCO2: 22 mmol/L (ref 22–32)
pH, Ven: 7.351 (ref 7.250–7.430)
pO2, Ven: 78 mmHg — ABNORMAL HIGH (ref 32.0–45.0)

## 2017-12-01 MED ORDER — SODIUM CHLORIDE 0.9 % IV BOLUS
2000.0000 mL | Freq: Once | INTRAVENOUS | Status: AC
  Administered 2017-12-01: 1000 mL via INTRAVENOUS

## 2017-12-01 MED ORDER — INSULIN NPH ISOPHANE & REGULAR (70-30) 100 UNIT/ML ~~LOC~~ SUSP: 28.0000 [IU] | Freq: Two times a day (BID) | SUBCUTANEOUS | 0 refills | Status: AC

## 2017-12-01 MED ORDER — INSULIN NPH ISOPHANE & REGULAR (70-30) 100 UNIT/ML ~~LOC~~ SUSP: 28.0000 [IU] | Freq: Two times a day (BID) | SUBCUTANEOUS | 0 refills | Status: DC

## 2017-12-01 NOTE — ED Notes (Addendum)
CBG 470. RN notified.

## 2017-12-01 NOTE — Discharge Instructions (Addendum)
Your blood sugar was high today, but you are not in DKA (which is what you had when you were admitted in April). It's very important that you take your insulin as directed, and check your sugars regularly. Keep a log of your blood sugar readings, checking it 4 times per day. Stay very well hydrated with lots of water. Eat a low carbohydrate diet, avoid sugary foods/beverages. Follow up with the [facility] in 3-5 days for recheck of symptoms and to establish medical care, and so you can have ongoing management of your diabetes. Return to the ER for emergent changes or worsening symptoms.

## 2017-12-01 NOTE — ED Provider Notes (Signed)
Patient placed in Quick Look pathway, seen and evaluated   Chief Complaint: hyperglycemia  HPI:   32 year old insulin-dependent type II diabetic who presents emergency room today for hyperglycemia.  Patient reports that he was diagnosed with diabetes approximately 3-4 months ago.  He reports he recently got out of [place] and has been without his insulin over the last 4 days.  He reports that he has had blood sugars running in the 5-600s at home.  He reports he has some lightheadedness but otherwise is asymptomatic.  Denies any fever, nausea, vomiting, polydipsia, polyuria, polyphagia, abdominal pain or diarrhea.  Patient has been in DKA in the past.  ROS:  Positive ROS: (+) Hyperglycemia, lightheaded Negative ROS: (-) Abdominal pain, nausea, vomiting, polyuria, polydipsia  Physical Exam:    Gen: No distress  Neuro: Awake and Alert  Skin: Warm    Focused Exam: Heart RRR, nml S1,S2, no m/r/g; Lungs CTAB; Abd soft, NT, no rebound or guarding; Ext 2+ pedal pulses bilaterally, no edema.  BP (!) 138/99 (BP Location: Right Arm)   Pulse 87   Temp 98.4 F (36.9 C) (Oral)   Resp 16   SpO2 97%   Plan:  Based on initial evaluation, labs are indicated and radiology studies are not indicated.  Patient counseled on process, plan, and necessity for staying for completing the evaluation."  Initiation of care has begun. The patient has been counseled on the process, plan, and necessity for staying for the completion/evaluation, and the remainder of the medical screening examination    Lorelle Gibbs 12/01/17 1818    Sherwood Gambler, MD 12/02/17 1755
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
## 2017-12-03 LAB — CBG MONITORING, ED: Glucose-Capillary: 470 mg/dL — ABNORMAL HIGH (ref 70–99)

## 2018-07-11 ENCOUNTER — Other Ambulatory Visit: Payer: Self-pay | Admitting: Urology

## 2018-07-11 ENCOUNTER — Other Ambulatory Visit (HOSPITAL_COMMUNITY): Payer: Self-pay | Admitting: Urology

## 2018-07-11 DIAGNOSIS — N1330 Unspecified hydronephrosis: Secondary | ICD-10-CM

## 2018-07-12 ENCOUNTER — Ambulatory Visit (HOSPITAL_COMMUNITY): Admission: RE | Admit: 2018-07-12 | Payer: Self-pay | Source: Ambulatory Visit

## 2018-07-12 ENCOUNTER — Emergency Department (HOSPITAL_BASED_OUTPATIENT_CLINIC_OR_DEPARTMENT_OTHER)
Admission: EM | Admit: 2018-07-12 | Discharge: 2018-07-13 | Disposition: A | Discharge status: Home / Self Care | Payer: Self-pay | Attending: Emergency Medicine | Admitting: Emergency Medicine

## 2018-07-12 ENCOUNTER — Other Ambulatory Visit: Payer: Self-pay

## 2018-07-12 ENCOUNTER — Encounter (HOSPITAL_BASED_OUTPATIENT_CLINIC_OR_DEPARTMENT_OTHER): Payer: Self-pay | Admitting: *Deleted

## 2018-07-12 ENCOUNTER — Encounter (HOSPITAL_COMMUNITY): Payer: Self-pay

## 2018-07-12 DIAGNOSIS — R109 Unspecified abdominal pain: Secondary | ICD-10-CM

## 2018-07-12 DIAGNOSIS — R1030 Lower abdominal pain, unspecified: Secondary | ICD-10-CM | POA: Insufficient documentation

## 2018-07-12 HISTORY — DX: Other psychoactive substance abuse, uncomplicated: F19.10

## 2018-07-12 LAB — URINALYSIS, MICROSCOPIC (REFLEX): WBC UA: NONE SEEN WBC/hpf (ref 0–5)

## 2018-07-12 LAB — URINALYSIS, ROUTINE W REFLEX MICROSCOPIC
Bilirubin Urine: NEGATIVE
GLUCOSE, UA: NEGATIVE mg/dL
HGB URINE DIPSTICK: NEGATIVE
KETONES UR: 15 mg/dL — AB
LEUKOCYTE UA: NEGATIVE
Nitrite: NEGATIVE
PROTEIN: 30 mg/dL — AB
Specific Gravity, Urine: >1.03 — ABNORMAL HIGH (ref 1.005–1.030)
pH: 6 (ref 5.0–8.0)

## 2018-07-12 MED ORDER — HYDROMORPHONE HCL 1 MG/ML IJ SOLN
2.0000 mg | Freq: Once | INTRAMUSCULAR | Status: AC
  Administered 2018-07-13: 2 mg via INTRAMUSCULAR
  Filled 2018-07-12: qty 2
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
## 2018-07-12 NOTE — ED Triage Notes (Signed)
Pt reports that he has had  Reconstructive surgery of his bladder and left kidney. Reports pain in his bladder and kidney that started today. Reports pain in flank with urination. Denies penile pain or discharge. Also has hx of kidney stones.

## 2018-07-13 ENCOUNTER — Emergency Department (HOSPITAL_BASED_OUTPATIENT_CLINIC_OR_DEPARTMENT_OTHER): Payer: Self-pay

## 2018-07-13 MED ORDER — HYDROCODONE-ACETAMINOPHEN 5-325 MG PO TABS: 1.0000 | ORAL_TABLET | Freq: Four times a day (QID) | ORAL | 0 refills | Status: DC | PRN

## 2018-07-13 MED ORDER — CEPHALEXIN 500 MG PO CAPS: 500.0000 mg | ORAL_CAPSULE | Freq: Four times a day (QID) | ORAL | 0 refills | Status: DC

## 2018-07-13 NOTE — Discharge Instructions (Signed)
Keflex as prescribed.  Ibuprofen 600 mg every 6 hours as needed for pain.  Hydrocodone as needed for pain not relieved with ibuprofen.  Follow-up with [facility] if your symptoms or not improving in the next 2 to 3 days.  Their contact information has been provided in this discharge summary for you to call and make these arrangements.

## 2018-08-28 ENCOUNTER — Telehealth: Payer: Self-pay

## 2018-08-28 NOTE — Telephone Encounter (Signed)
Very sorry, I am unable to accept this patient at this time 

## 2018-08-28 NOTE — Telephone Encounter (Signed)
Copied from [place] [phone]. Topic: General - Other >> Aug 28, 2018  2:59 PM Carolyn Stare wrote:  Person call would like to become a pt of Dr  Jenny Reichmann verified information, pt does not have insurance said he would be self pay >> Aug 28, 2018  3:20 PM Lonia Skinner [place] A wrote: Please advise?

## 2018-08-28 NOTE — Telephone Encounter (Signed)
Spoke with patient, informed him of information below. Patient refused another office. He requested an appointment with Jodi Mourning as a new patient. Appointment has been made for 11/13/18. New patient package has been mailed.

## 2018-11-13 ENCOUNTER — Telehealth: Payer: Self-pay | Admitting: Family

## 2018-11-13 ENCOUNTER — Ambulatory Visit: Payer: Self-pay | Admitting: Family

## 2018-11-13 DIAGNOSIS — Z0289 Encounter for other administrative examinations: Secondary | ICD-10-CM

## 2018-11-13 NOTE — Telephone Encounter (Signed)
Patient no-showed his new patient appointment with he. He will need to schedule with another PCP to establish care if he wants to re-schedule.

## 2018-11-30 ENCOUNTER — Ambulatory Visit (INDEPENDENT_AMBULATORY_CARE_PROVIDER_SITE_OTHER): Payer: Self-pay | Admitting: Family

## 2018-11-30 ENCOUNTER — Other Ambulatory Visit: Payer: Self-pay

## 2018-11-30 ENCOUNTER — Encounter: Payer: Self-pay | Admitting: Family

## 2018-11-30 VITALS — BP 128/84 | HR 85 | Temp 98.4°F | Ht 68.0 in | Wt 194.0 lb

## 2018-11-30 DIAGNOSIS — E119 Type 2 diabetes mellitus without complications: Secondary | ICD-10-CM

## 2018-11-30 DIAGNOSIS — Z794 Long term (current) use of insulin: Secondary | ICD-10-CM
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
## 2019-01-28 ENCOUNTER — Encounter (HOSPITAL_COMMUNITY): Payer: Self-pay | Admitting: Emergency Medicine

## 2019-01-28 ENCOUNTER — Emergency Department (HOSPITAL_COMMUNITY): Payer: Self-pay

## 2019-01-28 ENCOUNTER — Emergency Department (HOSPITAL_COMMUNITY)
Admission: EM | Admit: 2019-01-28 | Discharge: 2019-01-28 | Disposition: A | Discharge status: Home / Self Care | Payer: Self-pay | Attending: Emergency Medicine | Admitting: Emergency Medicine

## 2019-01-28 ENCOUNTER — Other Ambulatory Visit: Payer: Self-pay

## 2019-01-28 DIAGNOSIS — J45909 Unspecified asthma, uncomplicated: Secondary | ICD-10-CM | POA: Insufficient documentation

## 2019-01-28 DIAGNOSIS — F17210 Nicotine dependence, cigarettes, uncomplicated: Secondary | ICD-10-CM | POA: Insufficient documentation

## 2019-01-28 DIAGNOSIS — R109 Unspecified abdominal pain: Secondary | ICD-10-CM

## 2019-01-28 DIAGNOSIS — Z79899 Other long term (current) drug therapy: Secondary | ICD-10-CM | POA: Insufficient documentation

## 2019-01-28 DIAGNOSIS — Z794 Long term (current) use of insulin: Secondary | ICD-10-CM | POA: Insufficient documentation

## 2019-01-28 DIAGNOSIS — I10 Essential (primary) hypertension: Secondary | ICD-10-CM | POA: Insufficient documentation

## 2019-01-28 DIAGNOSIS — E119 Type 2 diabetes mellitus without complications: Secondary | ICD-10-CM | POA: Insufficient documentation

## 2019-01-28 DIAGNOSIS — K5900 Constipation, unspecified: Secondary | ICD-10-CM | POA: Insufficient documentation

## 2019-01-28 DIAGNOSIS — R1013 Epigastric pain: Secondary | ICD-10-CM | POA: Insufficient documentation

## 2019-01-28 LAB — COMPREHENSIVE METABOLIC PANEL
ALT: 14 U/L (ref 0–44)
AST: 34 U/L (ref 15–41)
Albumin: 3.5 g/dL (ref 3.5–5.0)
Alkaline Phosphatase: 74 U/L (ref 38–126)
Anion gap: 12 (ref 5–15)
BUN: 10 mg/dL (ref 6–20)
CO2: 23 mmol/L (ref 22–32)
Calcium: 9.1 mg/dL (ref 8.9–10.3)
Chloride: 104 mmol/L (ref 98–111)
Creatinine, Ser: 0.96 mg/dL (ref 0.61–1.24)
GFR calc Af Amer: >60 mL/min (ref >60)
GFR calc non Af Amer: >60 mL/min (ref >60)
Glucose, Bld: 94 mg/dL (ref 70–99)
Potassium: 3.7 mmol/L (ref 3.5–5.1)
Sodium: 139 mmol/L (ref 135–145)
Total Bilirubin: 0.4 mg/dL (ref 0.3–1.2)
Total Protein: 7.2 g/dL (ref 6.5–8.1)

## 2019-01-28 LAB — CBC
HCT: 37.8 % — ABNORMAL LOW (ref 39.0–52.0)
Hemoglobin: 13.3 g/dL (ref 13.0–17.0)
MCH: 27.5 pg (ref 26.0–34.0)
MCHC: 35.2 g/dL (ref 30.0–36.0)
MCV: 78.1 fL — ABNORMAL LOW (ref 80.0–100.0)
Platelets: 391 10*3/uL (ref 150–400)
RBC: 4.84 MIL/uL (ref 4.22–5.81)
RDW: 14.3 % (ref 11.5–15.5)
WBC: 8.2 10*3/uL (ref 4.0–10.5)
nRBC: 0 % (ref 0.0–0.2)

## 2019-01-28 LAB — URINALYSIS, ROUTINE W REFLEX MICROSCOPIC
Glucose, UA: NEGATIVE mg/dL
Hgb urine dipstick: NEGATIVE
Ketones, ur: 5 mg/dL — AB
Leukocytes,Ua: NEGATIVE
Nitrite: NEGATIVE
Protein, ur: 100 mg/dL — AB
Specific Gravity, Urine: >1.046 — ABNORMAL HIGH (ref 1.005–1.030)
pH: 5 (ref 5.0–8.0)

## 2019-01-28 LAB — LIPASE, BLOOD: Lipase: 18 U/L (ref 11–51)

## 2019-01-28 MED ORDER — POLYETHYLENE GLYCOL 3350 17 G PO PACK: 17.0000 g | PACK | Freq: Every day | ORAL | 0 refills | Status: DC

## 2019-01-28 MED ORDER — IOHEXOL 300 MG/ML  SOLN
100.0000 mL | Freq: Once | INTRAMUSCULAR | Status: AC | PRN
  Administered 2019-01-28: 23:00:00 100 mL via INTRAVENOUS

## 2019-01-28 MED ORDER — SODIUM CHLORIDE 0.9 % IV BOLUS
500.0000 mL | Freq: Once | INTRAVENOUS | Status: AC
  Administered 2019-01-28: 500 mL via INTRAVENOUS

## 2019-01-28 MED ORDER — DOCUSATE SODIUM 250 MG PO CAPS: 250.0000 mg | ORAL_CAPSULE | Freq: Every day | ORAL | 0 refills | Status: DC

## 2019-01-28 MED ORDER — MORPHINE SULFATE (PF) 4 MG/ML IV SOLN
4.0000 mg | Freq: Once | INTRAVENOUS | Status: AC
  Administered 2019-01-28: 22:00:00 4 mg via INTRAVENOUS
  Filled 2019-01-28: qty 1

## 2019-01-28 MED ORDER — SODIUM CHLORIDE 0.9% FLUSH: 3.0000 mL | Freq: Once | INTRAVENOUS | Status: DC
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
## 2019-01-28 NOTE — ED Triage Notes (Signed)
Onset 2 weeks ago developed general abdominal pain and headache with nausea denies diarrhea.

## 2019-01-28 NOTE — Discharge Instructions (Addendum)
Please return for any problem.  Follow-up with your regular care providers as instructed.  Drink plenty of fluids.  Use Colace and MiraLAX as prescribed for treatment of your constipation.

## 2019-05-23 ENCOUNTER — Ambulatory Visit: Payer: Self-pay | Attending: Internal Medicine

## 2019-05-23 DIAGNOSIS — Z20822 Contact with and (suspected) exposure to covid-19: Secondary | ICD-10-CM | POA: Insufficient documentation

## 2019-05-24 LAB — NOVEL CORONAVIRUS, NAA: SARS-CoV-2, NAA: NOT DETECTED

## 2019-07-07 ENCOUNTER — Emergency Department (HOSPITAL_COMMUNITY)
Admission: EM | Admit: 2019-07-07 | Discharge: 2019-07-07 | Disposition: A | Discharge status: Home / Self Care | Payer: No Typology Code available for payment source | Attending: Emergency Medicine | Admitting: Emergency Medicine

## 2019-07-07 ENCOUNTER — Emergency Department (HOSPITAL_COMMUNITY): Payer: No Typology Code available for payment source

## 2019-07-07 ENCOUNTER — Encounter (HOSPITAL_COMMUNITY): Payer: Self-pay | Admitting: Emergency Medicine

## 2019-07-07 ENCOUNTER — Other Ambulatory Visit: Payer: Self-pay

## 2019-07-07 DIAGNOSIS — I10 Essential (primary) hypertension: Secondary | ICD-10-CM | POA: Diagnosis not present

## 2019-07-07 DIAGNOSIS — S0081XA Abrasion of other part of head, initial encounter: Secondary | ICD-10-CM | POA: Insufficient documentation

## 2019-07-07 DIAGNOSIS — Z794 Long term (current) use of insulin: Secondary | ICD-10-CM | POA: Diagnosis not present

## 2019-07-07 DIAGNOSIS — M545 Low back pain, unspecified: Secondary | ICD-10-CM

## 2019-07-07 DIAGNOSIS — R110 Nausea: Secondary | ICD-10-CM | POA: Insufficient documentation

## 2019-07-07 DIAGNOSIS — Y92410 Unspecified street and highway as the place of occurrence of the external cause: Secondary | ICD-10-CM | POA: Diagnosis not present

## 2019-07-07 DIAGNOSIS — S0990XA Unspecified injury of head, initial encounter: Secondary | ICD-10-CM | POA: Diagnosis present

## 2019-07-07 DIAGNOSIS — M542 Cervicalgia: Secondary | ICD-10-CM | POA: Insufficient documentation

## 2019-07-07 DIAGNOSIS — Z79899 Other long term (current) drug therapy: Secondary | ICD-10-CM | POA: Insufficient documentation

## 2019-07-07 DIAGNOSIS — E119 Type 2 diabetes mellitus without complications: Secondary | ICD-10-CM | POA: Insufficient documentation

## 2019-07-07 DIAGNOSIS — Y9389 Activity, other specified: Secondary | ICD-10-CM | POA: Insufficient documentation

## 2019-07-07 DIAGNOSIS — M79642 Pain in left hand: Secondary | ICD-10-CM | POA: Diagnosis not present

## 2019-07-07 DIAGNOSIS — Y998 Other external cause status: Secondary | ICD-10-CM | POA: Insufficient documentation

## 2019-07-07 DIAGNOSIS — F17210 Nicotine dependence, cigarettes, uncomplicated: Secondary | ICD-10-CM | POA: Insufficient documentation

## 2019-07-07 DIAGNOSIS — J45909 Unspecified asthma, uncomplicated: Secondary | ICD-10-CM | POA: Diagnosis not present

## 2019-07-07 DIAGNOSIS — R1031 Right lower quadrant pain: Secondary | ICD-10-CM | POA: Insufficient documentation

## 2019-07-07 DIAGNOSIS — R519 Headache, unspecified: Secondary | ICD-10-CM

## 2019-07-07 LAB — CBC WITH DIFFERENTIAL/PLATELET
Abs Immature Granulocytes: 0.03 10*3/uL (ref 0.00–0.07)
Basophils Absolute: 0 10*3/uL (ref 0.0–0.1)
Basophils Relative: 0 %
Eosinophils Absolute: 0 10*3/uL (ref 0.0–0.5)
Eosinophils Relative: 1 %
HCT: 41.3 % (ref 39.0–52.0)
Hemoglobin: 14 g/dL (ref 13.0–17.0)
Immature Granulocytes: 0 %
Lymphocytes Relative: 12 %
Lymphs Abs: 1.1 10*3/uL (ref 0.7–4.0)
MCH: 26.9 pg (ref 26.0–34.0)
MCHC: 33.9 g/dL (ref 30.0–36.0)
MCV: 79.3 fL — ABNORMAL LOW (ref 80.0–100.0)
Monocytes Absolute: 0.5 10*3/uL (ref 0.1–1.0)
Monocytes Relative: 6 %
Neutro Abs: 7.1 10*3/uL (ref 1.7–7.7)
Neutrophils Relative %: 81 %
Platelets: 299 10*3/uL (ref 150–400)
RBC: 5.21 MIL/uL (ref 4.22–5.81)
RDW: 16.6 % — ABNORMAL HIGH (ref 11.5–15.5)
WBC: 8.8 10*3/uL (ref 4.0–10.5)
nRBC: 0 % (ref 0.0–0.2)

## 2019-07-07 LAB — BASIC METABOLIC PANEL
Anion gap: 12 (ref 5–15)
BUN: 11 mg/dL (ref 6–20)
CO2: 22 mmol/L (ref 22–32)
Calcium: 8.7 mg/dL — ABNORMAL LOW (ref 8.9–10.3)
Chloride: 104 mmol/L (ref 98–111)
Creatinine, Ser: 0.82 mg/dL (ref 0.61–1.24)
GFR calc Af Amer: >60 mL/min (ref >60)
GFR calc non Af Amer: >60 mL/min (ref >60)
Glucose, Bld: 82 mg/dL (ref 70–99)
Potassium: 4.2 mmol/L (ref 3.5–5.1)
Sodium: 138 mmol/L (ref 135–145)

## 2019-07-07 LAB — CBG MONITORING, ED: Glucose-Capillary: 96 mg/dL (ref 70–99)

## 2019-07-07 MED ORDER — DIPHENHYDRAMINE HCL 25 MG PO CAPS
25.0000 mg | ORAL_CAPSULE | Freq: Once | ORAL | Status: AC
  Administered 2019-07-07: 25 mg via ORAL
  Filled 2019-07-07: qty 1

## 2019-07-07 MED ORDER — NAPROXEN 500 MG PO TABS: 500.0000 mg | ORAL_TABLET | Freq: Two times a day (BID) | ORAL | 0 refills | Status: DC

## 2019-07-07 MED ORDER — METHOCARBAMOL 500 MG PO TABS: 500.0000 mg | ORAL_TABLET | Freq: Two times a day (BID) | ORAL | 0 refills | Status: DC

## 2019-07-07 MED ORDER — IOHEXOL 300 MG/ML  SOLN
100.0000 mL | Freq: Once | INTRAMUSCULAR | Status: AC | PRN
  Administered 2019-07-07: 21:00:00 100 mL via INTRAVENOUS

## 2019-07-07 MED ORDER — PROCHLORPERAZINE MALEATE 5 MG PO TABS
10.0000 mg | ORAL_TABLET | Freq: Once | ORAL | Status: AC
  Administered 2019-07-07: 10 mg via ORAL
  Filled 2019-07-07: qty 2

## 2019-07-07 MED ORDER — ONDANSETRON HCL 4 MG/2ML IJ SOLN
4.0000 mg | Freq: Once | INTRAMUSCULAR | Status: AC
  Administered 2019-07-07: 19:00:00 4 mg via INTRAVENOUS
  Filled 2019-07-07: qty 2

## 2019-07-07 MED ORDER — LIDOCAINE 5 % EX PTCH
1.0000 | MEDICATED_PATCH | Freq: Once | CUTANEOUS | Status: DC
  Filled 2019-07-07: qty 1

## 2019-07-07 MED ORDER — KETOROLAC TROMETHAMINE 30 MG/ML IJ SOLN
30.0000 mg | Freq: Once | INTRAMUSCULAR | Status: AC
  Administered 2019-07-07: 30 mg via INTRAVENOUS
  Filled 2019-07-07: qty 1
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
## 2019-07-07 NOTE — ED Notes (Signed)
Labs drawn but not clicked off
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
## 2019-08-22 ENCOUNTER — Emergency Department (HOSPITAL_COMMUNITY)
Admission: EM | Admit: 2019-08-22 | Discharge: 2019-08-22 | Disposition: A | Discharge status: Home / Self Care | Payer: Self-pay | Attending: Emergency Medicine | Admitting: Emergency Medicine

## 2019-08-22 ENCOUNTER — Other Ambulatory Visit: Payer: Self-pay

## 2019-08-22 DIAGNOSIS — Z794 Long term (current) use of insulin: Secondary | ICD-10-CM | POA: Insufficient documentation

## 2019-08-22 DIAGNOSIS — R112 Nausea with vomiting, unspecified: Secondary | ICD-10-CM | POA: Insufficient documentation

## 2019-08-22 DIAGNOSIS — I10 Essential (primary) hypertension: Secondary | ICD-10-CM | POA: Insufficient documentation

## 2019-08-22 DIAGNOSIS — E119 Type 2 diabetes mellitus without complications: Secondary | ICD-10-CM | POA: Insufficient documentation

## 2019-08-22 DIAGNOSIS — R197 Diarrhea, unspecified: Secondary | ICD-10-CM | POA: Insufficient documentation

## 2019-08-22 DIAGNOSIS — F17210 Nicotine dependence, cigarettes, uncomplicated: Secondary | ICD-10-CM | POA: Insufficient documentation

## 2019-08-22 DIAGNOSIS — J45909 Unspecified asthma, uncomplicated: Secondary | ICD-10-CM | POA: Insufficient documentation

## 2019-08-22 LAB — CBC WITH DIFFERENTIAL/PLATELET
Abs Immature Granulocytes: 0.01 10*3/uL (ref 0.00–0.07)
Basophils Absolute: 0 10*3/uL (ref 0.0–0.1)
Basophils Relative: 0 %
Eosinophils Absolute: 0.1 10*3/uL (ref 0.0–0.5)
Eosinophils Relative: 1 %
HCT: 38.8 % — ABNORMAL LOW (ref 39.0–52.0)
Hemoglobin: 13.4 g/dL (ref 13.0–17.0)
Immature Granulocytes: 0 %
Lymphocytes Relative: 27 %
Lymphs Abs: 1.5 10*3/uL (ref 0.7–4.0)
MCH: 27.6 pg (ref 26.0–34.0)
MCHC: 34.5 g/dL (ref 30.0–36.0)
MCV: 80 fL (ref 80.0–100.0)
Monocytes Absolute: 0.6 10*3/uL (ref 0.1–1.0)
Monocytes Relative: 10 %
Neutro Abs: 3.4 10*3/uL (ref 1.7–7.7)
Neutrophils Relative %: 62 %
Platelets: 271 10*3/uL (ref 150–400)
RBC: 4.85 MIL/uL (ref 4.22–5.81)
RDW: 15.5 % (ref 11.5–15.5)
WBC: 5.5 10*3/uL (ref 4.0–10.5)
nRBC: 0 % (ref 0.0–0.2)

## 2019-08-22 LAB — CK: Total CK: 371 U/L (ref 49–397)

## 2019-08-22 LAB — COMPREHENSIVE METABOLIC PANEL
ALT: 12 U/L (ref 0–44)
AST: 22 U/L (ref 15–41)
Albumin: 3.9 g/dL (ref 3.5–5.0)
Alkaline Phosphatase: 57 U/L (ref 38–126)
Anion gap: 9 (ref 5–15)
BUN: 14 mg/dL (ref 6–20)
CO2: 25 mmol/L (ref 22–32)
Calcium: 8.6 mg/dL — ABNORMAL LOW (ref 8.9–10.3)
Chloride: 103 mmol/L (ref 98–111)
Creatinine, Ser: 1.09 mg/dL (ref 0.61–1.24)
GFR calc Af Amer: >60 mL/min (ref >60)
GFR calc non Af Amer: >60 mL/min (ref >60)
Glucose, Bld: 109 mg/dL — ABNORMAL HIGH (ref 70–99)
Potassium: 4.1 mmol/L (ref 3.5–5.1)
Sodium: 137 mmol/L (ref 135–145)
Total Bilirubin: 0.4 mg/dL (ref 0.3–1.2)
Total Protein: 7.3 g/dL (ref 6.5–8.1)

## 2019-08-22 LAB — CBG MONITORING, ED: Glucose-Capillary: 87 mg/dL (ref 70–99)

## 2019-08-22 LAB — URINALYSIS, ROUTINE W REFLEX MICROSCOPIC
Bilirubin Urine: NEGATIVE
Glucose, UA: NEGATIVE mg/dL
Hgb urine dipstick: NEGATIVE
Ketones, ur: NEGATIVE mg/dL
Leukocytes,Ua: NEGATIVE
Nitrite: NEGATIVE
Protein, ur: NEGATIVE mg/dL
Specific Gravity, Urine: 1.023 (ref 1.005–1.030)
pH: 6 (ref 5.0–8.0)

## 2019-08-22 LAB — LIPASE, BLOOD: Lipase: 24 U/L (ref 11–51)

## 2019-08-22 MED ORDER — SODIUM CHLORIDE 0.9 % IV BOLUS
1000.0000 mL | Freq: Once | INTRAVENOUS | Status: AC
  Administered 2019-08-22: 1000 mL via INTRAVENOUS

## 2019-08-22 MED ORDER — METHOCARBAMOL 500 MG PO TABS: 500.0000 mg | ORAL_TABLET | Freq: Two times a day (BID) | ORAL | 0 refills | Status: DC

## 2019-08-22 MED ORDER — ONDANSETRON HCL 4 MG/2ML IJ SOLN
4.0000 mg | Freq: Once | INTRAMUSCULAR | Status: AC
  Administered 2019-08-22: 4 mg via INTRAVENOUS
  Filled 2019-08-22: qty 2

## 2019-08-22 MED ORDER — HYDROXYZINE HCL 25 MG PO TABS: 25.0000 mg | ORAL_TABLET | Freq: Four times a day (QID) | ORAL | 0 refills | Status: DC

## 2019-08-22 MED ORDER — CLONIDINE HCL 0.1 MG PO TABS
0.1000 mg | ORAL_TABLET | Freq: Four times a day (QID) | ORAL | Status: DC
  Filled 2019-08-22: qty 1

## 2019-08-22 MED ORDER — CLONIDINE HCL 0.1 MG PO TABS
0.1000 mg | ORAL_TABLET | Freq: Once | ORAL | Status: AC
  Administered 2019-08-22: 0.1 mg via ORAL

## 2019-08-22 MED ORDER — METHOCARBAMOL 500 MG PO TABS
500.0000 mg | ORAL_TABLET | Freq: Once | ORAL | Status: AC
  Administered 2019-08-22: 500 mg via ORAL
  Filled 2019-08-22: qty 1

## 2019-08-22 MED ORDER — ONDANSETRON 4 MG PO TBDP: 4.0000 mg | ORAL_TABLET | Freq: Three times a day (TID) | ORAL | 0 refills | Status: DC | PRN

## 2019-08-22 MED ORDER — DICYCLOMINE HCL 20 MG PO TABS
20.0000 mg | ORAL_TABLET | Freq: Once | ORAL | Status: AC
  Administered 2019-08-22: 20 mg via ORAL
  Filled 2019-08-22: qty 1

## 2019-08-22 MED ORDER — HYDROXYZINE HCL 25 MG PO TABS
25.0000 mg | ORAL_TABLET | Freq: Four times a day (QID) | ORAL | Status: DC | PRN
  Administered 2019-08-22: 25 mg via ORAL
  Filled 2019-08-22: qty 1

## 2019-08-22 NOTE — ED Triage Notes (Addendum)
Pt c/o N/V/D x 1 day after pt stop taking pain meds. Pt took pepto

## 2019-08-22 NOTE — Discharge Instructions (Addendum)
Thank you for allowing me to care for you today in the Emergency Department.   Your work-up today was very reassuring.  Your labs were normal.  Let 1 tablet of Zofran dissolve in your tongue every 8 hours as needed for nausea and vomiting.  You can take 1 tablet of methocarbamol up to 2 times daily for muscle pain or spasms.  Take 1 tablet of hydroxyzine every 6 hours as needed for anxiety.  Take 650 mg of Tylenol or 600 mg of ibuprofen with food every 6 hours for pain.  You can alternate between these 2 medications every 3 hours if your pain returns.  For instance, you can take Tylenol at noon, followed by a dose of ibuprofen at 3, followed by second dose of Tylenol and 6.  Call the number on your paperwork to get established with a primary care provider.  If you continue to have anxiety, you can call to get established with [facility] for follow-up.  Return to the emergency department if your blood sugar persistently runs high, if you have persistent vomiting despite taking Zofran, may develop severe abdominal pain with high fevers, black or bloody stools or vomiting, or other new, concerning symptoms.
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
## 2019-08-22 NOTE — ED Notes (Signed)
Patient discussing discharge medications with provider.

## 2019-10-03 ENCOUNTER — Emergency Department (HOSPITAL_COMMUNITY)
Admission: EM | Admit: 2019-10-03 | Discharge: 2019-10-03 | Disposition: A | Discharge status: Home / Self Care | Payer: Self-pay

## 2019-10-09 ENCOUNTER — Emergency Department (HOSPITAL_COMMUNITY)
Admission: EM | Admit: 2019-10-09 | Discharge: 2019-10-09 | Disposition: A | Discharge status: Home / Self Care | Payer: No Typology Code available for payment source | Attending: Emergency Medicine | Admitting: Emergency Medicine

## 2019-10-09 ENCOUNTER — Encounter (HOSPITAL_COMMUNITY): Payer: Self-pay

## 2019-10-09 ENCOUNTER — Other Ambulatory Visit: Payer: Self-pay

## 2019-10-09 DIAGNOSIS — E119 Type 2 diabetes mellitus without complications: Secondary | ICD-10-CM | POA: Diagnosis not present

## 2019-10-09 DIAGNOSIS — Y939 Activity, unspecified: Secondary | ICD-10-CM | POA: Insufficient documentation

## 2019-10-09 DIAGNOSIS — Y929 Unspecified place or not applicable: Secondary | ICD-10-CM | POA: Diagnosis not present

## 2019-10-09 DIAGNOSIS — F419 Anxiety disorder, unspecified: Secondary | ICD-10-CM | POA: Insufficient documentation

## 2019-10-09 DIAGNOSIS — Z794 Long term (current) use of insulin: Secondary | ICD-10-CM | POA: Insufficient documentation

## 2019-10-09 DIAGNOSIS — Z79899 Other long term (current) drug therapy: Secondary | ICD-10-CM | POA: Insufficient documentation

## 2019-10-09 DIAGNOSIS — J45909 Unspecified asthma, uncomplicated: Secondary | ICD-10-CM | POA: Insufficient documentation

## 2019-10-09 DIAGNOSIS — Y999 Unspecified external cause status: Secondary | ICD-10-CM | POA: Insufficient documentation

## 2019-10-09 DIAGNOSIS — M7918 Myalgia, other site: Secondary | ICD-10-CM

## 2019-10-09 DIAGNOSIS — I10 Essential (primary) hypertension: Secondary | ICD-10-CM | POA: Diagnosis not present

## 2019-10-09 DIAGNOSIS — M791 Myalgia, unspecified site: Secondary | ICD-10-CM | POA: Insufficient documentation

## 2019-10-09 DIAGNOSIS — F410 Panic disorder [episodic paroxysmal anxiety] without agoraphobia: Secondary | ICD-10-CM | POA: Diagnosis present

## 2019-10-09 LAB — CBC WITH DIFFERENTIAL/PLATELET
Abs Immature Granulocytes: 0.02 10*3/uL (ref 0.00–0.07)
Basophils Absolute: 0 10*3/uL (ref 0.0–0.1)
Basophils Relative: 0 %
Eosinophils Absolute: 0.1 10*3/uL (ref 0.0–0.5)
Eosinophils Relative: 2 %
HCT: 39.6 % (ref 39.0–52.0)
Hemoglobin: 13.6 g/dL (ref 13.0–17.0)
Immature Granulocytes: 0 %
Lymphocytes Relative: 16 %
Lymphs Abs: 1 10*3/uL (ref 0.7–4.0)
MCH: 27.9 pg (ref 26.0–34.0)
MCHC: 34.3 g/dL (ref 30.0–36.0)
MCV: 81.1 fL (ref 80.0–100.0)
Monocytes Absolute: 0.4 10*3/uL (ref 0.1–1.0)
Monocytes Relative: 6 %
Neutro Abs: 4.9 10*3/uL (ref 1.7–7.7)
Neutrophils Relative %: 76 %
Platelets: 253 10*3/uL (ref 150–400)
RBC: 4.88 MIL/uL (ref 4.22–5.81)
RDW: 14.6 % (ref 11.5–15.5)
WBC: 6.5 10*3/uL (ref 4.0–10.5)
nRBC: 0 % (ref 0.0–0.2)

## 2019-10-09 LAB — BASIC METABOLIC PANEL
Anion gap: 9 (ref 5–15)
BUN: 15 mg/dL (ref 6–20)
CO2: 22 mmol/L (ref 22–32)
Calcium: 8.7 mg/dL — ABNORMAL LOW (ref 8.9–10.3)
Chloride: 106 mmol/L (ref 98–111)
Creatinine, Ser: 0.95 mg/dL (ref 0.61–1.24)
GFR calc Af Amer: >60 mL/min (ref >60)
GFR calc non Af Amer: >60 mL/min (ref >60)
Glucose, Bld: 89 mg/dL (ref 70–99)
Potassium: 5 mmol/L (ref 3.5–5.1)
Sodium: 137 mmol/L (ref 135–145)

## 2019-10-09 LAB — CBG MONITORING, ED: Glucose-Capillary: 118 mg/dL — ABNORMAL HIGH (ref 70–99)

## 2019-10-09 MED ORDER — HYDROXYZINE HCL 25 MG PO TABS: 25.0000 mg | ORAL_TABLET | Freq: Four times a day (QID) | ORAL | 0 refills | Status: AC

## 2019-10-09 MED ORDER — METHOCARBAMOL 500 MG PO TABS: 500.0000 mg | ORAL_TABLET | Freq: Two times a day (BID) | ORAL | 0 refills | Status: AC
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
## 2019-10-09 NOTE — ED Notes (Signed)
Pt's chair adjusted for comfort.
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
## 2019-11-14 ENCOUNTER — Emergency Department (HOSPITAL_COMMUNITY)
Admission: EM | Admit: 2019-11-14 | Discharge: 2019-11-14 | Disposition: A | Discharge status: Home / Self Care | Payer: Self-pay | Attending: Emergency Medicine | Admitting: Emergency Medicine

## 2019-11-14 ENCOUNTER — Encounter (HOSPITAL_COMMUNITY): Payer: Self-pay | Admitting: Emergency Medicine

## 2019-11-14 ENCOUNTER — Emergency Department (HOSPITAL_COMMUNITY): Payer: Self-pay

## 2019-11-14 ENCOUNTER — Other Ambulatory Visit: Payer: Self-pay

## 2019-11-14 DIAGNOSIS — R1110 Vomiting, unspecified: Secondary | ICD-10-CM | POA: Insufficient documentation

## 2019-11-14 DIAGNOSIS — E119 Type 2 diabetes mellitus without complications: Secondary | ICD-10-CM | POA: Insufficient documentation

## 2019-11-14 DIAGNOSIS — F17210 Nicotine dependence, cigarettes, uncomplicated: Secondary | ICD-10-CM | POA: Insufficient documentation

## 2019-11-14 DIAGNOSIS — K5900 Constipation, unspecified: Secondary | ICD-10-CM | POA: Insufficient documentation

## 2019-11-14 DIAGNOSIS — Z794 Long term (current) use of insulin: Secondary | ICD-10-CM | POA: Insufficient documentation

## 2019-11-14 DIAGNOSIS — Z7951 Long term (current) use of inhaled steroids: Secondary | ICD-10-CM | POA: Insufficient documentation

## 2019-11-14 DIAGNOSIS — I10 Essential (primary) hypertension: Secondary | ICD-10-CM | POA: Insufficient documentation

## 2019-11-14 DIAGNOSIS — J45909 Unspecified asthma, uncomplicated: Secondary | ICD-10-CM | POA: Insufficient documentation

## 2019-11-14 DIAGNOSIS — Z7982 Long term (current) use of aspirin: Secondary | ICD-10-CM | POA: Insufficient documentation

## 2019-11-14 DIAGNOSIS — Z79899 Other long term (current) drug therapy: Secondary | ICD-10-CM | POA: Insufficient documentation

## 2019-11-14 LAB — URINALYSIS, ROUTINE W REFLEX MICROSCOPIC
Bacteria, UA: NONE SEEN
Bilirubin Urine: NEGATIVE
Glucose, UA: NEGATIVE mg/dL
Hgb urine dipstick: NEGATIVE
Ketones, ur: 20 mg/dL — AB
Leukocytes,Ua: NEGATIVE
Nitrite: NEGATIVE
Protein, ur: 30 mg/dL — AB
Specific Gravity, Urine: 1.02 (ref 1.005–1.030)
pH: 7 (ref 5.0–8.0)

## 2019-11-14 LAB — COMPREHENSIVE METABOLIC PANEL
ALT: 14 U/L (ref 0–44)
AST: 22 U/L (ref 15–41)
Albumin: 4.2 g/dL (ref 3.5–5.0)
Alkaline Phosphatase: 61 U/L (ref 38–126)
Anion gap: 14 (ref 5–15)
BUN: 15 mg/dL (ref 6–20)
CO2: 23 mmol/L (ref 22–32)
Calcium: 9.6 mg/dL (ref 8.9–10.3)
Chloride: 104 mmol/L (ref 98–111)
Creatinine, Ser: 0.72 mg/dL (ref 0.61–1.24)
GFR calc Af Amer: >60 mL/min (ref >60)
GFR calc non Af Amer: >60 mL/min (ref >60)
Glucose, Bld: 165 mg/dL — ABNORMAL HIGH (ref 70–99)
Potassium: 3.9 mmol/L (ref 3.5–5.1)
Sodium: 141 mmol/L (ref 135–145)
Total Bilirubin: 0.5 mg/dL (ref 0.3–1.2)
Total Protein: 8 g/dL (ref 6.5–8.1)

## 2019-11-14 LAB — CBC
HCT: 39.9 % (ref 39.0–52.0)
Hemoglobin: 13.7 g/dL (ref 13.0–17.0)
MCH: 27.4 pg (ref 26.0–34.0)
MCHC: 34.3 g/dL (ref 30.0–36.0)
MCV: 79.8 fL — ABNORMAL LOW (ref 80.0–100.0)
Platelets: 313 10*3/uL (ref 150–400)
RBC: 5 MIL/uL (ref 4.22–5.81)
RDW: 14.8 % (ref 11.5–15.5)
WBC: 11.6 10*3/uL — ABNORMAL HIGH (ref 4.0–10.5)
nRBC: 0 % (ref 0.0–0.2)

## 2019-11-14 LAB — LIPASE, BLOOD: Lipase: 19 U/L (ref 11–51)

## 2019-11-14 MED ORDER — ONDANSETRON 4 MG PO TBDP
ORAL_TABLET | ORAL | 0 refills | Status: AC
Start: 2019-11-14 — End: ?

## 2019-11-14 MED ORDER — SODIUM CHLORIDE 0.9% FLUSH: 3.0000 mL | Freq: Once | INTRAVENOUS | Status: DC

## 2019-11-14 MED ORDER — KETOROLAC TROMETHAMINE 30 MG/ML IJ SOLN
30.0000 mg | Freq: Once | INTRAMUSCULAR | Status: AC
  Administered 2019-11-14: 30 mg via INTRAVENOUS
  Filled 2019-11-14: qty 1

## 2019-11-14 MED ORDER — IOHEXOL 300 MG/ML  SOLN
100.0000 mL | Freq: Once | INTRAMUSCULAR | Status: AC | PRN
  Administered 2019-11-14: 100 mL via INTRAVENOUS

## 2019-11-14 MED ORDER — SODIUM CHLORIDE 0.9 % IV BOLUS
1000.0000 mL | Freq: Once | INTRAVENOUS | Status: AC
  Administered 2019-11-14: 1000 mL via INTRAVENOUS

## 2019-11-14 MED ORDER — ONDANSETRON HCL 4 MG/2ML IJ SOLN
4.0000 mg | Freq: Once | INTRAMUSCULAR | Status: AC
  Administered 2019-11-14: 4 mg via INTRAVENOUS
  Filled 2019-11-14: qty 2

## 2019-11-14 MED ORDER — IBUPROFEN 800 MG PO TABS
800.0000 mg | ORAL_TABLET | Freq: Three times a day (TID) | ORAL | 0 refills | Status: DC | PRN
Start: 2019-11-14 — End: 2020-01-08

## 2019-11-14 MED ORDER — ONDANSETRON HCL 4 MG/2ML IJ SOLN
4.0000 mg | Freq: Once | INTRAMUSCULAR | Status: DC
  Filled 2019-11-14: qty 2

## 2019-11-14 MED ORDER — SODIUM CHLORIDE (PF) 0.9 % IJ SOLN
INTRAMUSCULAR | Status: AC
  Filled 2019-11-14: qty 50

## 2019-11-14 MED ORDER — HYDROMORPHONE HCL 1 MG/ML IJ SOLN
1.0000 mg | Freq: Once | INTRAMUSCULAR | Status: AC
  Administered 2019-11-14: 1 mg via INTRAVENOUS
  Filled 2019-11-14: qty 1

## 2019-11-14 MED ORDER — METAMUCIL 0.52 G PO CAPS: 0.5200 g | ORAL_CAPSULE | Freq: Every day | ORAL | 1 refills | Status: AC

## 2019-11-14 NOTE — Discharge Instructions (Signed)
Drink plenty of fluids and follow-up with your family doctor if not improving 

## 2019-11-14 NOTE — ED Notes (Signed)
Patient had one episode of emesis and is requesting pain medication. MD aware

## 2019-11-14 NOTE — ED Triage Notes (Signed)
BIBA Per EMS: Pt states he has been vomiting since 2pm yesterday afternoon. Denies eating anything out of ordinary.  Pt states cant keep anything down. Generalized abd pain.  Temp 98.1  BP 188/90  HR 90 RR 18 98 Room air  CBG 162 Hx diabetes
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
## 2019-12-12 ENCOUNTER — Other Ambulatory Visit: Payer: Self-pay

## 2019-12-12 ENCOUNTER — Emergency Department (HOSPITAL_COMMUNITY)
Admission: EM | Admit: 2019-12-12 | Discharge: 2019-12-12 | Discharge status: Against Medical Advice | Payer: No Typology Code available for payment source

## 2020-01-07 ENCOUNTER — Other Ambulatory Visit: Payer: Self-pay

## 2020-01-07 ENCOUNTER — Emergency Department (HOSPITAL_COMMUNITY): Payer: 59

## 2020-01-07 DIAGNOSIS — Z7982 Long term (current) use of aspirin: Secondary | ICD-10-CM | POA: Insufficient documentation

## 2020-01-07 DIAGNOSIS — R350 Frequency of micturition: Secondary | ICD-10-CM | POA: Insufficient documentation

## 2020-01-07 DIAGNOSIS — F17210 Nicotine dependence, cigarettes, uncomplicated: Secondary | ICD-10-CM | POA: Insufficient documentation

## 2020-01-07 DIAGNOSIS — M549 Dorsalgia, unspecified: Secondary | ICD-10-CM | POA: Insufficient documentation

## 2020-01-07 DIAGNOSIS — Z79899 Other long term (current) drug therapy: Secondary | ICD-10-CM | POA: Insufficient documentation

## 2020-01-07 DIAGNOSIS — R109 Unspecified abdominal pain: Secondary | ICD-10-CM | POA: Diagnosis present

## 2020-01-07 DIAGNOSIS — R300 Dysuria: Secondary | ICD-10-CM | POA: Diagnosis not present

## 2020-01-07 DIAGNOSIS — J45909 Unspecified asthma, uncomplicated: Secondary | ICD-10-CM | POA: Diagnosis not present

## 2020-01-07 DIAGNOSIS — I10 Essential (primary) hypertension: Secondary | ICD-10-CM | POA: Diagnosis not present

## 2020-01-07 DIAGNOSIS — Z794 Long term (current) use of insulin: Secondary | ICD-10-CM | POA: Diagnosis not present

## 2020-01-07 DIAGNOSIS — E1110 Type 2 diabetes mellitus with ketoacidosis without coma: Secondary | ICD-10-CM | POA: Diagnosis not present

## 2020-01-07 LAB — BASIC METABOLIC PANEL
Anion gap: 14 (ref 5–15)
BUN: 18 mg/dL (ref 6–20)
CO2: 21 mmol/L — ABNORMAL LOW (ref 22–32)
Calcium: 9.4 mg/dL (ref 8.9–10.3)
Chloride: 106 mmol/L (ref 98–111)
Creatinine, Ser: 0.97 mg/dL (ref 0.61–1.24)
GFR calc Af Amer: >60 mL/min (ref >60)
GFR calc non Af Amer: >60 mL/min (ref >60)
Glucose, Bld: 104 mg/dL — ABNORMAL HIGH (ref 70–99)
Potassium: 4.1 mmol/L (ref 3.5–5.1)
Sodium: 141 mmol/L (ref 135–145)

## 2020-01-07 LAB — CBC
HCT: 38.4 % — ABNORMAL LOW (ref 39.0–52.0)
Hemoglobin: 13.5 g/dL (ref 13.0–17.0)
MCH: 27.7 pg (ref 26.0–34.0)
MCHC: 35.2 g/dL (ref 30.0–36.0)
MCV: 78.9 fL — ABNORMAL LOW (ref 80.0–100.0)
Platelets: 334 10*3/uL (ref 150–400)
RBC: 4.87 MIL/uL (ref 4.22–5.81)
RDW: 15 % (ref 11.5–15.5)
WBC: 6.2 10*3/uL (ref 4.0–10.5)
nRBC: 0 % (ref 0.0–0.2)

## 2020-01-08 ENCOUNTER — Encounter (HOSPITAL_COMMUNITY): Payer: Self-pay | Admitting: Emergency Medicine

## 2020-01-08 ENCOUNTER — Emergency Department (HOSPITAL_COMMUNITY)
Admission: EM | Admit: 2020-01-08 | Discharge: 2020-01-08 | Disposition: A | Discharge status: Home / Self Care | Payer: 59 | Attending: Emergency Medicine | Admitting: Emergency Medicine

## 2020-01-08 DIAGNOSIS — R109 Unspecified abdominal pain: Secondary | ICD-10-CM

## 2020-01-08 DIAGNOSIS — R300 Dysuria: Secondary | ICD-10-CM

## 2020-01-08 LAB — URINALYSIS, ROUTINE W REFLEX MICROSCOPIC
Bacteria, UA: NONE SEEN
Glucose, UA: NEGATIVE mg/dL
Hgb urine dipstick: NEGATIVE
Ketones, ur: NEGATIVE mg/dL
Leukocytes,Ua: NEGATIVE
Nitrite: NEGATIVE
Protein, ur: 30 mg/dL — AB
Specific Gravity, Urine: 1.041 — ABNORMAL HIGH (ref 1.005–1.030)
pH: 5 (ref 5.0–8.0)

## 2020-01-08 LAB — GLUCOSE, CAPILLARY: Glucose-Capillary: 113 mg/dL — ABNORMAL HIGH (ref 70–99)

## 2020-01-08 MED ORDER — KETOROLAC TROMETHAMINE 30 MG/ML IJ SOLN
30.0000 mg | Freq: Once | INTRAMUSCULAR | Status: AC
  Administered 2020-01-08: 30 mg via INTRAMUSCULAR
  Filled 2020-01-08: qty 1

## 2020-01-08 MED ORDER — CEFTRIAXONE SODIUM 1 G IJ SOLR
1.0000 g | Freq: Once | INTRAMUSCULAR | Status: AC
  Administered 2020-01-08: 1 g via INTRAMUSCULAR
  Filled 2020-01-08: qty 10

## 2020-01-08 MED ORDER — HYDROCODONE-ACETAMINOPHEN 5-325 MG PO TABS: 1.0000 | ORAL_TABLET | Freq: Four times a day (QID) | ORAL | 0 refills | Status: AC | PRN

## 2020-01-08 MED ORDER — CEPHALEXIN 500 MG PO CAPS
500.0000 mg | ORAL_CAPSULE | Freq: Four times a day (QID) | ORAL | 0 refills | Status: AC
Start: 2020-01-08 — End: ?

## 2020-01-08 MED ORDER — IBUPROFEN 600 MG PO TABS: 600.0000 mg | ORAL_TABLET | Freq: Four times a day (QID) | ORAL | 0 refills | Status: AC | PRN

## 2020-01-08 MED ORDER — HYDROCODONE-ACETAMINOPHEN 5-325 MG PO TABS
1.0000 | ORAL_TABLET | Freq: Once | ORAL | Status: AC
  Administered 2020-01-08: 1 via ORAL
  Filled 2020-01-08: qty 1

## 2020-01-08 NOTE — Discharge Instructions (Signed)
Your lab work and scan today are overall reassuring but I am concerned that you may have an underlying urinary tract infection.  We sent your urine for culture and you were given antibiotics today, please continue taking antibiotics as prescribed, you can use Tylenol and ibuprofen for pain, you may use the prescribed Norco as needed for breakthrough pain, do not drive or operate machinery while on this medication as it can cause some drowsiness.  Please call to schedule close follow-up appointment with your urologist.  Return for fevers, worsening pain or vomiting or any other new or concerning symptoms.
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
## 2020-01-09 LAB — URINE CULTURE: Culture: NO GROWTH

## 2020-02-19 ENCOUNTER — Ambulatory Visit (INDEPENDENT_AMBULATORY_CARE_PROVIDER_SITE_OTHER): Payer: Self-pay | Admitting: Primary Care

## 2022-12-11 ENCOUNTER — Other Ambulatory Visit: Payer: Self-pay

## 2022-12-11 ENCOUNTER — Emergency Department (HOSPITAL_COMMUNITY)
Admission: EM | Admit: 2022-12-11 | Discharge: 2022-12-12 | Disposition: A | Discharge status: Home / Self Care | Payer: Self-pay | Attending: Emergency Medicine | Admitting: Emergency Medicine

## 2022-12-11 ENCOUNTER — Encounter (HOSPITAL_COMMUNITY): Payer: Self-pay

## 2022-12-11 DIAGNOSIS — L739 Follicular disorder, unspecified: Secondary | ICD-10-CM | POA: Insufficient documentation

## 2022-12-11 DIAGNOSIS — E119 Type 2 diabetes mellitus without complications: Secondary | ICD-10-CM | POA: Insufficient documentation

## 2022-12-11 NOTE — ED Triage Notes (Signed)
Pt. Arrives POV c/o several abscesses around the base of his penis. He states that he has tried a warm compress. Some of them have popped, but they are very painful. He states that he also has two coming underneath his R. Arm.

## 2022-12-12 MED ORDER — DOXYCYCLINE HYCLATE 100 MG PO CAPS: 100.0000 mg | ORAL_CAPSULE | Freq: Two times a day (BID) | ORAL | 0 refills | Status: AC

## 2022-12-12 MED ORDER — DOXYCYCLINE HYCLATE 100 MG PO TABS
100.0000 mg | ORAL_TABLET | Freq: Once | ORAL | Status: AC
  Administered 2022-12-12: 100 mg via ORAL
  Filled 2022-12-12: qty 1

## 2022-12-12 MED ORDER — NAPROXEN 500 MG PO TABS: 500.0000 mg | ORAL_TABLET | Freq: Two times a day (BID) | ORAL | 0 refills | Status: AC

## 2022-12-12 NOTE — ED Provider Notes (Signed)
[facility]-EMERGENCY DEPT [facility] Emergency Department Provider Note MRN:  [id number]  Arrival date & time: 12/12/22     Chief Complaint   Abscess   History of Present Illness   Carlos Lawrence is a 37 y.o. year-old male presents to the ED with chief complaint of sores on his scrotum and pubic region that he states started draining pus.  He also reports some underneath his right arm.  He does report history of diabetes.  He states that he has had abscesses in the right axilla before.  Denies any history of hidradenitis suppurativa.  States that the bumps are making it difficult to walk and sit and work.  History provided by patient.   Review of Systems  Pertinent positive and negative review of systems noted in HPI.    Physical Exam   Vitals:   12/11/22 2330  BP: (!) 155/103  Pulse: 81  Resp: 15  Temp: 98.6 F (37 C)  SpO2: 98%    CONSTITUTIONAL:  non toxic-appearing, NAD NEURO:  Alert and oriented x 3, CN 3-12 grossly intact EYES:  eyes equal and reactive ENT/NECK:  Supple, no stridor  CARDIO:  normal rate, appears well-perfused  PULM:  No respiratory distress,  GI/GU:  non-distended,  MSK/SPINE:  No gross deformities, no edema, moves all extremities  SKIN:  no rash, atraumatic, multiple small indurated papules/pustules to the mons pubis and to the right axilla   *Additional and/or pertinent findings included in MDM below  Diagnostic and Interventional Summary    EKG Interpretation Date/Time:    Ventricular Rate:    PR Interval:    QRS Duration:    QT Interval:    QTC Calculation:   R Axis:      Text Interpretation:         Labs Reviewed - No data to display  No orders to display    Medications  doxycycline (VIBRA-TABS) tablet 100 mg (has no administration in time range)     Procedures  /  Critical Care Procedures  ED Course and Medical Decision Making  I have reviewed the triage vital signs, the nursing notes, and pertinent available  records from the EMR.  Social Determinants Affecting Complexity of Care: Patient has no clinically significant social determinants affecting this chief complaint..   ED Course:    Medical Decision Making Patient here with folliculitis.  There does not appear to be any abscesses that are large enough to warrant aspiration or incision and drainage.  Will treat with doxycycline.  Recommend outpatient PCP follow-up.  Return for new or worsening symptoms.  Risk Prescription drug management.         Consultants: No consultations were needed in caring for this patient.   Treatment and Plan: Emergency department workup does not suggest an emergent condition requiring admission or immediate intervention beyond  what has been performed at this time. The patient is safe for discharge and has  been instructed to return immediately for worsening symptoms, change in  symptoms or any other concerns    Final Clinical Impressions(s) / ED Diagnoses     ICD-10-CM   1. Folliculitis  L73.9       ED Discharge Orders          Ordered    doxycycline (VIBRAMYCIN) 100 MG capsule  2 times daily        12/12/22 0050    naproxen (NAPROSYN) 500 MG tablet  2 times daily        12/12/22 0050  Discharge Instructions Discussed with and Provided to Patient:   Discharge Instructions   None      Roxy Horseman, PA-C 12/12/22 0102    Glynn Octave, MD 12/12/22 [phone]

## 2023-01-11 ENCOUNTER — Encounter (HOSPITAL_COMMUNITY): Payer: Self-pay | Admitting: *Deleted

## 2023-01-11 ENCOUNTER — Emergency Department (HOSPITAL_COMMUNITY)
Admission: EM | Admit: 2023-01-11 | Discharge: 2023-01-11 | Discharge status: Against Medical Advice | Payer: Medicaid Other | Attending: Emergency Medicine | Admitting: Emergency Medicine

## 2023-01-11 ENCOUNTER — Other Ambulatory Visit: Payer: Self-pay

## 2023-01-11 DIAGNOSIS — Z794 Long term (current) use of insulin: Secondary | ICD-10-CM | POA: Diagnosis not present

## 2023-01-11 DIAGNOSIS — R1909 Other intra-abdominal and pelvic swelling, mass and lump: Secondary | ICD-10-CM | POA: Insufficient documentation

## 2023-01-11 LAB — COMPREHENSIVE METABOLIC PANEL WITH GFR
ALT: 35 U/L (ref 0–44)
AST: 29 U/L (ref 15–41)
Albumin: 3.9 g/dL (ref 3.5–5.0)
Alkaline Phosphatase: 80 U/L (ref 38–126)
Anion gap: 11 (ref 5–15)
BUN: 11 mg/dL (ref 6–20)
CO2: 25 mmol/L (ref 22–32)
Calcium: 9.3 mg/dL (ref 8.9–10.3)
Chloride: 96 mmol/L — ABNORMAL LOW (ref 98–111)
Creatinine, Ser: 0.8 mg/dL (ref 0.61–1.24)
GFR, Estimated: >60 mL/min (ref >60)
Glucose, Bld: 283 mg/dL — ABNORMAL HIGH (ref 70–99)
Potassium: 3.7 mmol/L (ref 3.5–5.1)
Sodium: 132 mmol/L — ABNORMAL LOW (ref 135–145)
Total Bilirubin: 0.8 mg/dL (ref 0.3–1.2)
Total Protein: 8 g/dL (ref 6.5–8.1)

## 2023-01-11 LAB — CBC WITH DIFFERENTIAL/PLATELET
Abs Immature Granulocytes: 0.04 10*3/uL (ref 0.00–0.07)
Basophils Absolute: 0 10*3/uL (ref 0.0–0.1)
Basophils Relative: 0 %
Eosinophils Absolute: 0.1 10*3/uL (ref 0.0–0.5)
Eosinophils Relative: 1 %
HCT: 42.1 % (ref 39.0–52.0)
Hemoglobin: 14.1 g/dL (ref 13.0–17.0)
Immature Granulocytes: 0 %
Lymphocytes Relative: 7 %
Lymphs Abs: 0.7 10*3/uL (ref 0.7–4.0)
MCH: 25.4 pg — ABNORMAL LOW (ref 26.0–34.0)
MCHC: 33.5 g/dL (ref 30.0–36.0)
MCV: 75.7 fL — ABNORMAL LOW (ref 80.0–100.0)
Monocytes Absolute: 0.6 10*3/uL (ref 0.1–1.0)
Monocytes Relative: 6 %
Neutro Abs: 8.4 10*3/uL — ABNORMAL HIGH (ref 1.7–7.7)
Neutrophils Relative %: 86 %
Platelets: 256 10*3/uL (ref 150–400)
RBC: 5.56 MIL/uL (ref 4.22–5.81)
RDW: 15.8 % — ABNORMAL HIGH (ref 11.5–15.5)
WBC: 9.8 10*3/uL (ref 4.0–10.5)
nRBC: 0 % (ref 0.0–0.2)

## 2023-01-11 LAB — RAPID HIV SCREEN (HIV 1/2 AB+AG)
HIV 1/2 Antibodies: NONREACTIVE
HIV-1 P24 Antigen - HIV24: NONREACTIVE

## 2023-01-11 MED ORDER — IBUPROFEN 800 MG PO TABS: 800.0000 mg | ORAL_TABLET | Freq: Once | ORAL | Status: DC

## 2023-01-11 NOTE — ED Provider Triage Note (Signed)
Emergency Medicine Provider Triage Evaluation Note  Carlos Lawrence , a 37 y.o. male  was evaluated in triage.  Pt complains of painful bumps on his suprapubic area and stomach.  Was seen here a couple weeks ago for the same issue and was started on a 10-day course of doxycycline.  He reports taking the whole course of doxycycline and they have not improved.  Denies any fevers or chills.  Denies any drainage from the area, but states they are very painful.  Denies any concern for STIs, but states he is sexually active.  Review of Systems  Positive: As above Negative: As above  Physical Exam  BP (!) 158/109 (BP Location: Right Arm)   Pulse 81   Temp 99.6 F (37.6 C) (Oral)   Resp 17   Ht 5\' 8"  (1.727 m)   Wt 93 kg   SpO2 100%   BMI 31.17 kg/m  Gen:   Awake, no distress   Resp:  Normal effort  MSK:   Moves extremities without difficulty  Other:  Painful firm bumps to the suprapubic area  Medical Decision Making  Medically screening exam initiated at 3:18 PM.  Appropriate orders placed.  Carlos Lawrence was informed that the remainder of the evaluation will be completed by another provider, this initial triage assessment does not replace that evaluation, and the importance of remaining in the ED until their evaluation is complete.     Arabella Merles, PA-C 01/11/23 1520

## 2023-01-11 NOTE — ED Triage Notes (Signed)
Pt states he was seen for "Boils" in private area, they never went away, 3 days ago started to become worse. Difficulty walking due to location.

## 2023-01-11 NOTE — ED Provider Notes (Signed)
[place] EMERGENCY DEPARTMENT AT [facility] Provider Note   CSN: [id number] Arrival date & time: 01/11/23  1457     History  Chief Complaint  Patient presents with   Abscess    Carlos Lawrence is a 37 y.o. male presents from home for painful bumps on his groin region.  States it has been there for the past couple weeks.  Was started on doxycycline and completed the full 10 day course, but reports the bumps are getting worse.  Denies any concern for STIs.  The areas have not been draining.  Denies any fever or chills.    Abscess      Home Medications Prior to Admission medications   Medication Sig Start Date End Date Taking? Authorizing Provider  aspirin-acetaminophen-caffeine (EXCEDRIN MIGRAINE) [phone] MG tablet Take 1 tablet by mouth every 6 (six) hours as needed for headache.    [provider]  cephALEXin (KEFLEX) 500 MG capsule Take 1 capsule (500 mg total) by mouth 4 (four) times daily. 01/08/20   Dartha Lodge, PA-C  doxycycline (VIBRAMYCIN) 100 MG capsule Take 1 capsule (100 mg total) by mouth 2 (two) times daily. 12/12/22   Roxy Horseman, PA-C  HYDROcodone-acetaminophen (NORCO) 5-325 MG tablet Take 1 tablet by mouth every 6 (six) hours as needed. 01/08/20   Dartha Lodge, PA-C  hydrOXYzine (ATARAX/VISTARIL) 25 MG tablet Take 1 tablet (25 mg total) by mouth every 6 (six) hours. Patient not taking: Reported on 11/14/2019 10/09/19   Tanda Rockers, PA-C  ibuprofen (ADVIL) 600 MG tablet Take 1 tablet (600 mg total) by mouth every 6 (six) hours as needed. 01/08/20   Dartha Lodge, PA-C  insulin NPH-regular Human (NOVOLIN 70/30) (70-30) 100 UNIT/ML injection Inject 28 Units into the skin 2 (two) times daily with a meal. 12/01/17   Street, [place], PA-C  methocarbamol (ROBAXIN) 500 MG tablet Take 1 tablet (500 mg total) by mouth 2 (two) times daily. Patient not taking: Reported on 11/14/2019 10/09/19   Tanda Rockers, PA-C  naproxen (NAPROSYN) 500 MG  tablet Take 1 tablet (500 mg total) by mouth 2 (two) times daily. 12/12/22   Roxy Horseman, PA-C  ondansetron (ZOFRAN ODT) 4 MG disintegrating tablet 4mg  ODT q4 hours prn nausea/vomit 11/14/19   Bethann Berkshire, MD  psyllium (METAMUCIL) 0.52 g capsule Take 1 capsule (0.52 g total) by mouth daily. 11/14/19   Bethann Berkshire, MD  fluticasone (FLONASE) 50 MCG/ACT nasal spray Place 2 sprays into both nostrils daily. Patient not taking: Reported on 12/01/2017 07/29/17 08/22/19  Calvert Cantor, MD  losartan (COZAAR) 25 MG tablet Take 1 tablet (25 mg total) by mouth daily. Patient not taking: Reported on 01/28/2019 07/29/17 08/22/19  Calvert Cantor, MD      Allergies    Patient has no known allergies.    Review of Systems   Review of Systems  Skin:        Bump    Physical Exam Updated Vital Signs BP (!) 158/109 (BP Location: Right Arm)   Pulse 81   Temp 99.6 F (37.6 C) (Oral)   Resp 17   Ht 5\' 8"  (1.727 m)   Wt 93 kg   SpO2 100%   BMI 31.17 kg/m  Physical Exam Vitals and nursing note reviewed.  Constitutional:      Appearance: Normal appearance.  HENT:     Head: Atraumatic.  Pulmonary:     Effort: Pulmonary effort is normal.  Skin:    General: Skin is warm and  dry.     Comments: Raised hard 4cm area of the left suprapubic region, raised hard 3cm area of the right suprapubic region. No obvious drainage. No palpable fluctuance     Neurological:     General: No focal deficit present.     Mental Status: He is alert.  Psychiatric:        Mood and Affect: Mood normal.        Behavior: Behavior normal.     ED Results / Procedures / Treatments   Labs (all labs ordered are listed, but only abnormal results are displayed) Labs Reviewed  RPR  RAPID HIV SCREEN (HIV 1/2 AB+AG)  CBC WITH DIFFERENTIAL/PLATELET  COMPREHENSIVE METABOLIC PANEL  GC/CHLAMYDIA PROBE AMP ([place]) NOT AT [facility]    EKG None  Radiology No results found.  Procedures Ultrasound ED Soft Tissue  Date/Time:  01/11/2023 5:33 PM  Performed by: Arabella Merles, PA-C Authorized by: Arabella Merles, PA-C   Procedure details:    Indications: localization of abscess     Transverse view:  Visualized   Longitudinal view:  Visualized   Images: not archived   Location:    Location comment:  Suprapubic   Side:  Right Comments:     Unable to visualize any discrete pockets of fluid in patients bumps      Medications Ordered in ED Medications  ibuprofen (ADVIL) tablet 800 mg (800 mg Oral Not Given 01/11/23 1743)    ED Course/ Medical Decision Making/ A&P                                 Medical Decision Making Amount and/or Complexity of Data Reviewed Labs: ordered.  Risk Prescription drug management.   37 y.o. male with pertinent past medical history of UTI presents to the ED for concern of painful bumps on his couple weeks, worsening  Differential diagnosis includes but is not limited to abscess, cellulitis, cyst, herpes simplex, syphilis   ED Course:  Upon admission, patient has 2 bumps of the suprapubic region,one on the left and one on the right. They are hard with no area of palpable fluctuance.  No areas of drainage.  There is slight abrasion to the right.  I used ultrasound to evaluate the area, but did not see any obvious areas to drain.  I was going to have Dr. Earlene Plater from further evaluate, but upon returning to the room, patient eloped from the facility.  Lab work still pending.  I was unable to discuss the risks of leaving with the patient.   Impression: Bumps in groin  Disposition:  Patient eloped from the facility  Lab Tests: I ordered labs which include CBC, HIV, gonorrhea chlamydia, RPR, CMP            Final Clinical Impression(s) / ED Diagnoses Final diagnoses:  None    Rx / DC Orders ED Discharge Orders     None         Arabella Merles, PA-C 01/11/23 1745    Laurence Spates, MD 01/14/23 1056

## 2023-01-12 LAB — GC/CHLAMYDIA PROBE AMP (~~LOC~~) NOT AT ARMC
Chlamydia: NEGATIVE
Comment: NEGATIVE
Comment: NORMAL
Neisseria Gonorrhea: NEGATIVE

## 2023-01-12 LAB — RPR: RPR Ser Ql: NONREACTIVE
